# Patient Record
Sex: Female | Born: 1940
Health system: Southern US, Community
[De-identification: ages and names within clinical notes are randomized; demographics above are authoritative.]

## PROBLEM LIST (undated history)

## (undated) ENCOUNTER — Emergency Department (HOSPITAL_COMMUNITY): Admission: EM | Payer: Self-pay | Source: Home / Self Care

## (undated) DIAGNOSIS — E785 Hyperlipidemia, unspecified: Secondary | ICD-10-CM

## (undated) DIAGNOSIS — K219 Gastro-esophageal reflux disease without esophagitis: Secondary | ICD-10-CM

## (undated) DIAGNOSIS — M8580 Other specified disorders of bone density and structure, unspecified site: Secondary | ICD-10-CM

## (undated) DIAGNOSIS — Z951 Presence of aortocoronary bypass graft: Secondary | ICD-10-CM

## (undated) DIAGNOSIS — I10 Essential (primary) hypertension: Secondary | ICD-10-CM

## (undated) DIAGNOSIS — G459 Transient cerebral ischemic attack, unspecified: Secondary | ICD-10-CM

## (undated) DIAGNOSIS — I639 Cerebral infarction, unspecified: Secondary | ICD-10-CM

## (undated) DIAGNOSIS — I214 Non-ST elevation (NSTEMI) myocardial infarction: Secondary | ICD-10-CM

## (undated) DIAGNOSIS — H5442A4 Blindness left eye category 4, normal vision right eye: Secondary | ICD-10-CM

## (undated) DIAGNOSIS — F419 Anxiety disorder, unspecified: Secondary | ICD-10-CM

## (undated) DIAGNOSIS — I69320 Aphasia following cerebral infarction: Secondary | ICD-10-CM

## (undated) DIAGNOSIS — E876 Hypokalemia: Secondary | ICD-10-CM

## (undated) DIAGNOSIS — I480 Paroxysmal atrial fibrillation: Secondary | ICD-10-CM

## (undated) DIAGNOSIS — I2510 Atherosclerotic heart disease of native coronary artery without angina pectoris: Secondary | ICD-10-CM

## (undated) DIAGNOSIS — I509 Heart failure, unspecified: Secondary | ICD-10-CM

## (undated) HISTORY — DX: Blindness left eye category 4, normal vision right eye: H54.42A4

## (undated) HISTORY — DX: Presence of aortocoronary bypass graft: Z95.1

## (undated) HISTORY — DX: Gastro-esophageal reflux disease without esophagitis: K21.9

## (undated) HISTORY — DX: Hypokalemia: E87.6

## (undated) HISTORY — DX: Paroxysmal atrial fibrillation: I48.0

## (undated) HISTORY — DX: Heart failure, unspecified: I50.9

## (undated) HISTORY — DX: Transient cerebral ischemic attack, unspecified: G45.9

## (undated) HISTORY — DX: Other specified disorders of bone density and structure, unspecified site: M85.80

## (undated) HISTORY — DX: Atherosclerotic heart disease of native coronary artery without angina pectoris: I25.10

## (undated) HISTORY — PX: ABDOMINAL HYSTERECTOMY: SHX81

## (undated) HISTORY — DX: Hyperlipidemia, unspecified: E78.5

## (undated) HISTORY — DX: Cerebral infarction, unspecified: I63.9

## (undated) HISTORY — DX: Aphasia following cerebral infarction: I69.320

## (undated) HISTORY — DX: Anxiety disorder, unspecified: F41.9

---

## 1998-03-28 ENCOUNTER — Encounter: Payer: Self-pay | Admitting: Internal Medicine

## 1999-10-05 ENCOUNTER — Other Ambulatory Visit: Admission: RE | Admit: 1999-10-05 | Discharge: 1999-10-05 | Payer: Self-pay | Admitting: Gynecology

## 2002-09-06 ENCOUNTER — Ambulatory Visit (HOSPITAL_COMMUNITY): Admission: RE | Admit: 2002-09-06 | Discharge: 2002-09-06 | Payer: Self-pay | Admitting: Gastroenterology

## 2004-07-22 ENCOUNTER — Ambulatory Visit: Payer: Self-pay | Admitting: Internal Medicine

## 2004-08-05 ENCOUNTER — Ambulatory Visit: Payer: Self-pay | Admitting: Internal Medicine

## 2004-09-10 ENCOUNTER — Ambulatory Visit: Payer: Self-pay | Admitting: Internal Medicine

## 2004-10-29 ENCOUNTER — Ambulatory Visit: Payer: Self-pay | Admitting: Internal Medicine

## 2004-12-13 ENCOUNTER — Other Ambulatory Visit: Admission: RE | Admit: 2004-12-13 | Discharge: 2004-12-13 | Payer: Self-pay | Admitting: Obstetrics and Gynecology

## 2006-12-21 ENCOUNTER — Encounter: Payer: Self-pay | Admitting: Internal Medicine

## 2006-12-21 DIAGNOSIS — I10 Essential (primary) hypertension: Secondary | ICD-10-CM

## 2006-12-21 DIAGNOSIS — M949 Disorder of cartilage, unspecified: Secondary | ICD-10-CM

## 2006-12-21 DIAGNOSIS — E785 Hyperlipidemia, unspecified: Secondary | ICD-10-CM | POA: Insufficient documentation

## 2006-12-21 DIAGNOSIS — M899 Disorder of bone, unspecified: Secondary | ICD-10-CM | POA: Insufficient documentation

## 2006-12-21 DIAGNOSIS — K219 Gastro-esophageal reflux disease without esophagitis: Secondary | ICD-10-CM | POA: Insufficient documentation

## 2006-12-21 DIAGNOSIS — M8580 Other specified disorders of bone density and structure, unspecified site: Secondary | ICD-10-CM

## 2006-12-21 HISTORY — DX: Essential (primary) hypertension: I10

## 2006-12-21 HISTORY — DX: Other specified disorders of bone density and structure, unspecified site: M85.80

## 2006-12-21 HISTORY — DX: Hyperlipidemia, unspecified: E78.5

## 2007-08-24 ENCOUNTER — Ambulatory Visit: Payer: Self-pay | Admitting: Internal Medicine

## 2007-08-24 DIAGNOSIS — I2510 Atherosclerotic heart disease of native coronary artery without angina pectoris: Secondary | ICD-10-CM

## 2007-08-24 DIAGNOSIS — F411 Generalized anxiety disorder: Secondary | ICD-10-CM | POA: Insufficient documentation

## 2007-08-24 HISTORY — DX: Atherosclerotic heart disease of native coronary artery without angina pectoris: I25.10

## 2007-09-03 ENCOUNTER — Telehealth: Payer: Self-pay | Admitting: *Deleted

## 2007-10-16 ENCOUNTER — Ambulatory Visit: Payer: Self-pay | Admitting: Internal Medicine

## 2007-10-16 LAB — CONVERTED CEMR LAB
BUN: 12 mg/dL (ref 6–23)
Basophils Relative: 1 % (ref 0.0–3.0)
CO2: 31 meq/L (ref 19–32)
Chloride: 99 meq/L (ref 96–112)
Creatinine, Ser: 0.8 mg/dL (ref 0.4–1.2)
Eosinophils Relative: 3.1 % (ref 0.0–5.0)
HDL: 38.2 mg/dL — ABNORMAL LOW (ref >39.0)
Lymphocytes Relative: 23.6 % (ref 12.0–46.0)
Monocytes Relative: 6.6 % (ref 3.0–12.0)
Neutrophils Relative %: 65.7 % (ref 43.0–77.0)
RBC: 4.71 M/uL (ref 3.87–5.11)
Total CHOL/HDL Ratio: 8
VLDL: 43 mg/dL — ABNORMAL HIGH (ref 0–40)
WBC: 6.2 10*3/uL (ref 4.5–10.5)

## 2007-10-23 ENCOUNTER — Ambulatory Visit: Payer: Self-pay | Admitting: Internal Medicine

## 2008-01-09 ENCOUNTER — Ambulatory Visit: Payer: Self-pay | Admitting: Internal Medicine

## 2008-01-09 LAB — CONVERTED CEMR LAB
Bilirubin, Direct: 0.2 mg/dL (ref 0.0–0.3)
Direct LDL: 203.5 mg/dL
HDL: 37.2 mg/dL — ABNORMAL LOW (ref >39.0)
Rh Type: POSITIVE
Total Bilirubin: 1.2 mg/dL (ref 0.3–1.2)
Total Protein: 7.2 g/dL (ref 6.0–8.3)
Triglycerides: 182 mg/dL — ABNORMAL HIGH (ref 0–149)
VLDL: 36 mg/dL (ref 0–40)

## 2008-01-16 ENCOUNTER — Ambulatory Visit: Payer: Self-pay | Admitting: Internal Medicine

## 2008-01-25 ENCOUNTER — Telehealth: Payer: Self-pay | Admitting: Internal Medicine

## 2008-09-08 ENCOUNTER — Encounter: Payer: Self-pay | Admitting: Internal Medicine

## 2009-05-06 ENCOUNTER — Ambulatory Visit (HOSPITAL_COMMUNITY): Admission: RE | Admit: 2009-05-06 | Discharge: 2009-05-06 | Payer: Self-pay | Admitting: Gastroenterology

## 2011-03-25 ENCOUNTER — Encounter (HOSPITAL_COMMUNITY)
Admission: EM | Disposition: A | Discharge status: Home / Self Care | Payer: Self-pay | Source: Other Acute Inpatient Hospital | Attending: Cardiothoracic Surgery

## 2011-03-25 ENCOUNTER — Inpatient Hospital Stay (HOSPITAL_COMMUNITY)
Admission: EM | Admit: 2011-03-25 | Discharge: 2011-04-02 | DRG: 234 | Disposition: A | Discharge status: Home / Self Care | Payer: Medicare Other | Source: Other Acute Inpatient Hospital | Attending: Cardiothoracic Surgery | Admitting: Cardiothoracic Surgery

## 2011-03-25 ENCOUNTER — Other Ambulatory Visit: Payer: Self-pay

## 2011-03-25 ENCOUNTER — Ambulatory Visit (INDEPENDENT_AMBULATORY_CARE_PROVIDER_SITE_OTHER): Payer: Medicare Other

## 2011-03-25 ENCOUNTER — Encounter (HOSPITAL_COMMUNITY): Payer: Self-pay | Admitting: Anesthesiology

## 2011-03-25 ENCOUNTER — Encounter (HOSPITAL_COMMUNITY): Payer: Self-pay | Admitting: Cardiology

## 2011-03-25 ENCOUNTER — Inpatient Hospital Stay (HOSPITAL_COMMUNITY): Payer: Medicare Other

## 2011-03-25 DIAGNOSIS — Z951 Presence of aortocoronary bypass graft: Secondary | ICD-10-CM

## 2011-03-25 DIAGNOSIS — I219 Acute myocardial infarction, unspecified: Secondary | ICD-10-CM

## 2011-03-25 DIAGNOSIS — I214 Non-ST elevation (NSTEMI) myocardial infarction: Principal | ICD-10-CM | POA: Diagnosis present

## 2011-03-25 DIAGNOSIS — I4729 Other ventricular tachycardia: Secondary | ICD-10-CM

## 2011-03-25 DIAGNOSIS — K219 Gastro-esophageal reflux disease without esophagitis: Secondary | ICD-10-CM | POA: Diagnosis present

## 2011-03-25 DIAGNOSIS — I472 Ventricular tachycardia, unspecified: Secondary | ICD-10-CM | POA: Diagnosis not present

## 2011-03-25 DIAGNOSIS — I10 Essential (primary) hypertension: Secondary | ICD-10-CM | POA: Diagnosis present

## 2011-03-25 DIAGNOSIS — I480 Paroxysmal atrial fibrillation: Secondary | ICD-10-CM | POA: Diagnosis not present

## 2011-03-25 DIAGNOSIS — E785 Hyperlipidemia, unspecified: Secondary | ICD-10-CM | POA: Diagnosis present

## 2011-03-25 DIAGNOSIS — E8779 Other fluid overload: Secondary | ICD-10-CM | POA: Diagnosis not present

## 2011-03-25 DIAGNOSIS — I4891 Unspecified atrial fibrillation: Secondary | ICD-10-CM | POA: Diagnosis not present

## 2011-03-25 DIAGNOSIS — J9819 Other pulmonary collapse: Secondary | ICD-10-CM | POA: Diagnosis not present

## 2011-03-25 DIAGNOSIS — F411 Generalized anxiety disorder: Secondary | ICD-10-CM | POA: Diagnosis present

## 2011-03-25 DIAGNOSIS — D62 Acute posthemorrhagic anemia: Secondary | ICD-10-CM | POA: Diagnosis not present

## 2011-03-25 DIAGNOSIS — Z882 Allergy status to sulfonamides status: Secondary | ICD-10-CM

## 2011-03-25 DIAGNOSIS — I2510 Atherosclerotic heart disease of native coronary artery without angina pectoris: Secondary | ICD-10-CM

## 2011-03-25 DIAGNOSIS — M949 Disorder of cartilage, unspecified: Secondary | ICD-10-CM | POA: Diagnosis present

## 2011-03-25 DIAGNOSIS — Z7982 Long term (current) use of aspirin: Secondary | ICD-10-CM

## 2011-03-25 DIAGNOSIS — Z01810 Encounter for preprocedural cardiovascular examination: Secondary | ICD-10-CM

## 2011-03-25 DIAGNOSIS — Z79899 Other long term (current) drug therapy: Secondary | ICD-10-CM

## 2011-03-25 DIAGNOSIS — M899 Disorder of bone, unspecified: Secondary | ICD-10-CM | POA: Diagnosis present

## 2011-03-25 DIAGNOSIS — J90 Pleural effusion, not elsewhere classified: Secondary | ICD-10-CM | POA: Diagnosis not present

## 2011-03-25 HISTORY — PX: CARDIAC CATHETERIZATION: SHX172

## 2011-03-25 HISTORY — DX: Essential (primary) hypertension: I10

## 2011-03-25 HISTORY — PX: LEFT HEART CATHETERIZATION WITH CORONARY ANGIOGRAM: SHX5451

## 2011-03-25 HISTORY — DX: Non-ST elevation (NSTEMI) myocardial infarction: I21.4

## 2011-03-25 HISTORY — DX: Atherosclerotic heart disease of native coronary artery without angina pectoris: I25.10

## 2011-03-25 LAB — COMPREHENSIVE METABOLIC PANEL
ALT: 28 U/L (ref 0–35)
AST: 32 U/L (ref 0–37)
Albumin: 3.6 g/dL (ref 3.5–5.2)
CO2: 26 mEq/L (ref 19–32)
Calcium: 8.7 mg/dL (ref 8.4–10.5)
GFR calc non Af Amer: 85 mL/min — ABNORMAL LOW (ref >90)
Sodium: 140 mEq/L (ref 135–145)
Total Protein: 6.3 g/dL (ref 6.0–8.3)

## 2011-03-25 LAB — LIPID PANEL
Cholesterol: 272 mg/dL — ABNORMAL HIGH (ref 0–200)
HDL: 37 mg/dL — ABNORMAL LOW (ref >39)
Total CHOL/HDL Ratio: 7.4 RATIO
Triglycerides: 197 mg/dL — ABNORMAL HIGH (ref <150)
VLDL: 39 mg/dL (ref 0–40)

## 2011-03-25 LAB — CARDIAC PANEL(CRET KIN+CKTOT+MB+TROPI)
CK, MB: 8.1 ng/mL (ref 0.3–4.0)
Relative Index: 8.1 — ABNORMAL HIGH (ref 0.0–2.5)
Relative Index: 8.1 — ABNORMAL HIGH (ref 0.0–2.5)
Total CK: 108 U/L (ref 7–177)
Troponin I: 2.04 ng/mL (ref <0.30)
Troponin I: 3.59 ng/mL (ref <0.30)

## 2011-03-25 LAB — CBC
MCH: 28.6 pg (ref 26.0–34.0)
Platelets: 216 10*3/uL (ref 150–400)
RBC: 4.37 MIL/uL (ref 3.87–5.11)
RDW: 13.3 % (ref 11.5–15.5)

## 2011-03-25 LAB — URINALYSIS, ROUTINE W REFLEX MICROSCOPIC
Bilirubin Urine: NEGATIVE
Nitrite: NEGATIVE
Protein, ur: NEGATIVE mg/dL
Specific Gravity, Urine: >1.046 — ABNORMAL HIGH (ref 1.005–1.030)
Urobilinogen, UA: 0.2 mg/dL (ref 0.0–1.0)

## 2011-03-25 LAB — URINE MICROSCOPIC-ADD ON

## 2011-03-25 LAB — MAGNESIUM: Magnesium: 2.1 mg/dL (ref 1.5–2.5)

## 2011-03-25 LAB — POCT I-STAT, CHEM 8
Chloride: 104 mEq/L (ref 96–112)
HCT: 39 % (ref 36.0–46.0)
Hemoglobin: 13.3 g/dL (ref 12.0–15.0)
Potassium: 3.1 mEq/L — ABNORMAL LOW (ref 3.5–5.1)
Sodium: 143 mEq/L (ref 135–145)

## 2011-03-25 LAB — HEMOGLOBIN A1C: Mean Plasma Glucose: 123 mg/dL — ABNORMAL HIGH (ref <117)

## 2011-03-25 LAB — PREPARE RBC (CROSSMATCH)

## 2011-03-25 LAB — MRSA PCR SCREENING: MRSA by PCR: NEGATIVE

## 2011-03-25 SURGERY — LEFT HEART CATHETERIZATION WITH CORONARY ANGIOGRAM
Anesthesia: LOCAL

## 2011-03-25 MED ORDER — CARVEDILOL 6.25 MG PO TABS
6.2500 mg | ORAL_TABLET | Freq: Two times a day (BID) | ORAL | Status: DC
  Filled 2011-03-25: qty 1

## 2011-03-25 MED ORDER — LIDOCAINE HCL (PF) 1 % IJ SOLN
INTRAMUSCULAR | Status: AC
  Filled 2011-03-25: qty 30

## 2011-03-25 MED ORDER — CHLORHEXIDINE GLUCONATE 4 % EX LIQD
60.0000 mL | Freq: Once | CUTANEOUS | Status: AC
  Administered 2011-03-26: 4 via TOPICAL
  Filled 2011-03-25: qty 90

## 2011-03-25 MED ORDER — ALPRAZOLAM 0.25 MG PO TABS: 0.2500 mg | ORAL_TABLET | ORAL | Status: DC | PRN

## 2011-03-25 MED ORDER — HEPARIN (PORCINE) IN NACL 2-0.9 UNIT/ML-% IJ SOLN
INTRAMUSCULAR | Status: AC
  Filled 2011-03-25: qty 2000

## 2011-03-25 MED ORDER — CEFUROXIME SODIUM 1.5 G IJ SOLR
1.5000 g | INTRAMUSCULAR | Status: AC
  Administered 2011-03-26: 1.5 g via INTRAVENOUS
  Filled 2011-03-25: qty 1.5

## 2011-03-25 MED ORDER — SODIUM CHLORIDE 0.9 % IV SOLN
250.0000 mL | INTRAVENOUS | Status: DC | PRN
  Administered 2011-03-26: 14:00:00 via INTRAVENOUS

## 2011-03-25 MED ORDER — EPINEPHRINE HCL 1 MG/ML IJ SOLN
0.5000 ug/min | INTRAVENOUS | Status: DC
  Filled 2011-03-25: qty 4

## 2011-03-25 MED ORDER — MIDAZOLAM HCL 2 MG/2ML IJ SOLN
INTRAMUSCULAR | Status: AC
  Filled 2011-03-25: qty 2

## 2011-03-25 MED ORDER — HEPARIN SOD (PORCINE) IN D5W 100 UNIT/ML IV SOLN
750.0000 [IU]/h | INTRAVENOUS | Status: DC
  Administered 2011-03-25: 750 [IU]/h via INTRAVENOUS
  Filled 2011-03-25 (×2): qty 250

## 2011-03-25 MED ORDER — MAGNESIUM SULFATE 50 % IJ SOLN
40.0000 meq | INTRAMUSCULAR | Status: DC
  Filled 2011-03-25: qty 10

## 2011-03-25 MED ORDER — ZOLPIDEM TARTRATE 5 MG PO TABS: 5.0000 mg | ORAL_TABLET | Freq: Every evening | ORAL | Status: DC | PRN

## 2011-03-25 MED ORDER — POTASSIUM CHLORIDE CRYS ER 20 MEQ PO TBCR
40.0000 meq | EXTENDED_RELEASE_TABLET | Freq: Once | ORAL | Status: AC
  Administered 2011-03-25: 40 meq via ORAL

## 2011-03-25 MED ORDER — NITROGLYCERIN IN D5W 200-5 MCG/ML-% IV SOLN
2.0000 ug/min | INTRAVENOUS | Status: DC
  Administered 2011-03-25: 60 ug/min via INTRAVENOUS
  Administered 2011-03-26: 30 ug/min

## 2011-03-25 MED ORDER — DEXTROSE 5 % IV SOLN
750.0000 mg | INTRAVENOUS | Status: DC
  Filled 2011-03-25 (×2): qty 750

## 2011-03-25 MED ORDER — CHLORHEXIDINE GLUCONATE 4 % EX LIQD
60.0000 mL | Freq: Once | CUTANEOUS | Status: AC
  Administered 2011-03-25: 4 via TOPICAL

## 2011-03-25 MED ORDER — NITROGLYCERIN IN D5W 200-5 MCG/ML-% IV SOLN: 5.0000 ug/min | INTRAVENOUS | Status: DC

## 2011-03-25 MED ORDER — SODIUM CHLORIDE 0.9 % IV SOLN
0.1000 ug/kg/h | INTRAVENOUS | Status: DC
  Filled 2011-03-25: qty 4

## 2011-03-25 MED ORDER — DIAZEPAM 2 MG PO TABS: 2.0000 mg | ORAL_TABLET | ORAL | Status: DC | PRN

## 2011-03-25 MED ORDER — SODIUM CHLORIDE 0.9 % IV SOLN
INTRAVENOUS | Status: DC
  Administered 2011-03-25 – 2011-03-26 (×2): via INTRAVENOUS

## 2011-03-25 MED ORDER — EPTIFIBATIDE 75 MG/100ML IV SOLN
INTRAVENOUS | Status: AC
  Administered 2011-03-25: 1.968 ug/kg/min via INTRAVENOUS
  Filled 2011-03-25: qty 100

## 2011-03-25 MED ORDER — MORPHINE SULFATE 2 MG/ML IJ SOLN: 2.0000 mg | INTRAMUSCULAR | Status: DC | PRN

## 2011-03-25 MED ORDER — DOPAMINE-DEXTROSE 3.2-5 MG/ML-% IV SOLN
2.0000 ug/kg/min | INTRAVENOUS | Status: DC
  Filled 2011-03-25: qty 250

## 2011-03-25 MED ORDER — PANTOPRAZOLE SODIUM 40 MG PO TBEC: 40.0000 mg | DELAYED_RELEASE_TABLET | Freq: Two times a day (BID) | ORAL | Status: DC

## 2011-03-25 MED ORDER — NITROGLYCERIN IN D5W 200-5 MCG/ML-% IV SOLN
INTRAVENOUS | Status: AC
  Administered 2011-03-25: 60 ug/min via INTRAVENOUS
  Filled 2011-03-25: qty 250

## 2011-03-25 MED ORDER — PLASMA-LYTE 148 IV SOLN
INTRAVENOUS | Status: AC
  Administered 2011-03-26: 10:00:00
  Filled 2011-03-25: qty 0.5

## 2011-03-25 MED ORDER — ROSUVASTATIN CALCIUM 40 MG PO TABS
40.0000 mg | ORAL_TABLET | Freq: Every day | ORAL | Status: DC
  Administered 2011-03-25: 40 mg via ORAL
  Filled 2011-03-25 (×3): qty 1

## 2011-03-25 MED ORDER — CARVEDILOL 6.25 MG PO TABS
6.2500 mg | ORAL_TABLET | Freq: Two times a day (BID) | ORAL | Status: AC
  Administered 2011-03-25: 6.25 mg via ORAL
  Filled 2011-03-25: qty 1

## 2011-03-25 MED ORDER — EPTIFIBATIDE 75 MG/100ML IV SOLN
2.0000 ug/kg/min | INTRAVENOUS | Status: DC
  Administered 2011-03-25 – 2011-03-26 (×2): 1.968 ug/kg/min via INTRAVENOUS
  Filled 2011-03-25: qty 100

## 2011-03-25 MED ORDER — SODIUM CHLORIDE 0.9 % IJ SOLN
3.0000 mL | Freq: Two times a day (BID) | INTRAMUSCULAR | Status: DC
  Administered 2011-03-25: 3 mL via INTRAVENOUS

## 2011-03-25 MED ORDER — ASPIRIN EC 325 MG PO TBEC
325.0000 mg | DELAYED_RELEASE_TABLET | Freq: Every day | ORAL | Status: DC
  Administered 2011-03-25: 325 mg via ORAL
  Filled 2011-03-25: qty 1

## 2011-03-25 MED ORDER — PHENYLEPHRINE HCL 10 MG/ML IJ SOLN
30.0000 ug/min | INTRAVENOUS | Status: DC
  Filled 2011-03-25: qty 2

## 2011-03-25 MED ORDER — ONDANSETRON HCL 4 MG/2ML IJ SOLN: 4.0000 mg | Freq: Four times a day (QID) | INTRAMUSCULAR | Status: DC | PRN

## 2011-03-25 MED ORDER — NITROGLYCERIN 0.2 MG/ML ON CALL CATH LAB
INTRAVENOUS | Status: AC
  Filled 2011-03-25: qty 1

## 2011-03-25 MED ORDER — SODIUM CHLORIDE 0.9 % IJ SOLN: 3.0000 mL | INTRAMUSCULAR | Status: DC | PRN

## 2011-03-25 MED ORDER — PANTOPRAZOLE SODIUM 40 MG PO TBEC
40.0000 mg | DELAYED_RELEASE_TABLET | Freq: Every day | ORAL | Status: DC
  Administered 2011-03-25: 40 mg via ORAL
  Filled 2011-03-25: qty 1

## 2011-03-25 MED ORDER — POTASSIUM CHLORIDE 2 MEQ/ML IV SOLN
80.0000 meq | INTRAVENOUS | Status: DC
  Filled 2011-03-25: qty 40

## 2011-03-25 MED ORDER — NITROGLYCERIN IN D5W 200-5 MCG/ML-% IV SOLN
2.0000 ug/min | INTRAVENOUS | Status: DC
  Filled 2011-03-25: qty 250

## 2011-03-25 MED ORDER — CHLORHEXIDINE GLUCONATE 4 % EX LIQD
60.0000 mL | Freq: Once | CUTANEOUS | Status: AC
  Administered 2011-03-25: 4 via TOPICAL
  Filled 2011-03-25: qty 30

## 2011-03-25 MED ORDER — SODIUM CHLORIDE 0.9 % IV SOLN
INTRAVENOUS | Status: DC
  Filled 2011-03-25: qty 1

## 2011-03-25 MED ORDER — POTASSIUM CHLORIDE CRYS ER 20 MEQ PO TBCR
40.0000 meq | EXTENDED_RELEASE_TABLET | Freq: Once | ORAL | Status: AC
  Administered 2011-03-25: 40 meq via ORAL
  Filled 2011-03-25: qty 2

## 2011-03-25 MED ORDER — SODIUM CHLORIDE 0.9 % IV SOLN
INTRAVENOUS | Status: DC
  Filled 2011-03-25: qty 40

## 2011-03-25 MED ORDER — SODIUM CHLORIDE 0.9 % IV SOLN
1250.0000 mg | INTRAVENOUS | Status: AC
  Administered 2011-03-26: 1250 mg via INTRAVENOUS
  Filled 2011-03-25: qty 1250

## 2011-03-25 MED ORDER — ACETAMINOPHEN 325 MG PO TABS
650.0000 mg | ORAL_TABLET | ORAL | Status: DC | PRN
  Administered 2011-03-25: 650 mg via ORAL
  Filled 2011-03-25: qty 2

## 2011-03-25 MED ORDER — METOPROLOL TARTRATE 12.5 MG HALF TABLET
12.5000 mg | ORAL_TABLET | Freq: Once | ORAL | Status: AC
  Administered 2011-03-26: 12.5 mg via ORAL
  Filled 2011-03-25: qty 1

## 2011-03-25 MED ORDER — FENTANYL CITRATE 0.05 MG/ML IJ SOLN
INTRAMUSCULAR | Status: AC
  Filled 2011-03-25: qty 2

## 2011-03-25 MED ORDER — POTASSIUM CHLORIDE CRYS ER 20 MEQ PO TBCR
EXTENDED_RELEASE_TABLET | ORAL | Status: AC
  Administered 2011-03-25: 20:00:00
  Filled 2011-03-25: qty 2

## 2011-03-25 NOTE — Progress Notes (Signed)
Pharmacy Addendum: CABG planned for tomorrow AM.  Sheath to remain in place and heparin ordered to start now and d/c on call to OR in am.  Start heparin at 750 units/hr.  Check 6h level and CBC.  Cailie Bosshart L. Illene Bolus, PharmD, BCPS Clinical Pharmacist Pager: [phone] 03/25/2011 9:12 PM

## 2011-03-25 NOTE — ED Notes (Signed)
Pt is a transfer from [facility], STEMI activated. Pt arrived EMS and stopped at charge RN desk. EMSs EKG shown to edp and cardiology on call. Pt received 324asa, 50mg  metoprolol, and 2 nitros pta. Pt with 18g(L)AC 20g(R)AC. Pt transferred to cath lab on cardiac monitor. Husband shown to waiting area.

## 2011-03-25 NOTE — Brief Op Note (Signed)
03/25/2011  6:11 PM  PATIENT:  Stacy Adams  70 y.o. female with a PMH of obesity & HTN, likely HLD transferred from Urgent Care after presenting with SSx c/w Unstable Angina - diffuse ST segment depressions without clear ST Elevaations.  Troponin on initial check Positive for NSTEMI.  PRE-OPERATIVE DIAGNOSIS:  NSTEMI  POST-OPERATIVE DIAGNOSIS:  Severe MV CAD:  Prox LAD - focal 85-90% at takeoff of SP1 & small D1, followed by diffuse ~60-70% lesions with the final ~70-80% lesion just distal to major D2  Mid LCx prior to main OM1-OM2 bifurcation - 95%, tubular  Mid RCA sequential 95+% then 80% between 2 RVM branches; ostial RPDA 70%  EF well preserved - ~60% w/o significant WMA.  PROCEDURE:  Procedure(s): LEFT HEART CATHETERIZATION WITH CORONARY ANGIOGRAM LEFT VENTRICULOGRAPHY  Surgeon(s): Marykay Lex  ASSISTANTS: Alinda Dooms, RN  ANESTHESIA:   local and IV sedation, 2ng Versed, 50 mcg Fentanyl MEDICATIONS:  Integrilin bolus x 1 with drip started; IV NTG gtt @ 40mcg/min   EBL:    < 10 ml  LOCAL MEDICATIONS USED:  LIDOCAINE 18 ml   TOURNIQUET:  Manual pressure for sheath removal 2hr post Integrilin bolus  DICTATION: .Note written in paper chart and Note written in EPIC  PLAN OF CARE: Admit to inpatient ; CT Sgx (Dr.Gerhardt) consulted for CABG eval.  IV Heparin & Integrilin until CABG (at least 18hr of Integrilin, but with extent of disease - would prefer until CABG)  2D Echo  PATIENT DISPOSITION:  PACU - hemodynamically stable.  To CCU - 2905   Delay start of Pharmacological VTE agent (>24hrs) due to surgical blood loss or risk of bleeding:  N/A - will be on IV Heparin & Integrilin

## 2011-03-25 NOTE — Op Note (Signed)
[facility]     CARDIAC CATHETERIZATION REPORT  Stacy Adams   MRN: [id number] [birth year]    ADMIT DATE:  03/25/2011  Performing Cardiologist: Marykay Lex, MD,MS Primary Physician: Carrie Mew, MD Primary Cardiologist:  Marykay Lex, MD,MS  Procedures Performed:  Left Heart Catheterization via 6 Fr Right Common Femoral Artery access  Left Ventriculography, (RAO) 11 ml/sec for 33 ml total contrast  Native Coronary Angiography  Indication(s): Acute Cornary Syndrome - NSTEMI  Accelerated Hypertension  Abnormal ECG - diffuse Anterolateral ST segment depression  Postitive Troponin  History: 70 y.o. female with a PMH of obesity & HTN, likely HLD transferred from Urgent Care after presenting with SSx c/w Unstable Angina - diffuse ST segment depressions without clear ST Elevaations. Troponin on initial check Positive for NSTEMI. After further discussion, she has been having exertional dyspnea and chest pain for over a month.  Consent: The procedure with Risks/Benefits/Alternatives and Indications was reviewed with the patient.   The patient voice understanding and agree to proceed.  Verbal consent for Emergent procedure was obtained with multiple witnesses from Cath Lab staff.  Procedure: The patient was brought to the 2nd Floor [facility] in the fasting state and prepped and draped in the usual sterile fashion for Right groin access. Sterile technique was used including antiseptics, cap, gloves, gown, hand hygiene, mask and sheet. Skin prep: Chlorhexidine;  Time Out: Verified patient identification, verified procedure, site/side was marked, verified correct patient position, special equipment/implants available, medications/allergies/relevent history reviewed, required imaging and test results available.  Performed  The right femoral head was identified using tactile and fluoroscopic technique.  The right groin  was anesthetized with 1% subcutaneous Lidocaine.  The right Common Femoral Artery was accessed using the Modified Seldinger Technique with placement of a antimicrobial bonded/coated single lumen (6 Fr) sheath.  The sheath was aspirated and flushed.    A 6 Fr JL4.0 Catheter was advanced of over a Standard J wire into the ascending Aorta.  The catheter was used unsuccessful in engaging the Left Coronary Artery, so it was exchanged for a 6Fr JL 3.5 catheter which was used to engage the Left Coronary Artery.  Multiple cineangiographic views of the Lef Coronary Artery system were performed.  The JL 3.5 catheter was exchanged, over the Standard J wire, for a 6 Fr JR4 Catheter that was used to engage the Right Coronary Artery.  Multiple cineangiographic views of the Right Coronary Artery system were performed. This catheter was then exchanged, over the Standard J wire, for an angled Pigtail catheter that was advanced across the Aortic Valve.  LV hemodynamics were measured and Left Ventriculography was performed.  LV hemodynamics were then re-sampled, and the catheter was pulled back across the Aortic Valve for measurement of "pull-back" gradient.  The catheter and the wire was removed completely out of the body. With the wire still in place the sheath, a preselected right common femoral angiogram was performed to verify sheath position. The sheath was sutured in place as the patient was administered IV Integrilin bolus with drip upon completion of angiography.  The patient was transported to the PACU in CCU in hemodynamically stable, pain-free condition.   The patient  was stable before, during and following the procedure.   Patient did tolerate procedure well. There were not complications.  EBL: Less than 10 mL for procedure; 20 mL for labs  Medications:  Sedation:  2 mg IV Versed, 50 IV mcg Fentanyl  Contrast:  100 mL Omnipaque  Integrilin bolus and drip (single bolus)-weight-based to be continued until  bypass surgery  Hemodynamics:  Central Aortic Pressure / Mean Aortic Pressure: 167/90 mmHg ( )  LV Pressure / LV End diastolic Pressure:  166/13 mmHg; 19 mmHg  Left Ventriculography:  EF:  60-65%  Wall Motion: No significant abnormality - apical "nipple" noted.  Coronary Angiographic Data: Right Dominant  Left Main:  Large caliber, trifurcates into LAD, Ramus Intermedius, Left Circumflex; angiographically normal  Left Anterior Descending (LAD):  Moderate caliber (2.0-2.5 mm) vessel; At the take off of a very proximal D1 and SP1 there is a focal 85-90% lesion followed by 3 executive 60-70% lesions with a final lesion being just after the major diagonal (D2) branch in the mid vessel with 80% lesion. In this intervening space chart to septal perforators the second one being a large major perforating branch.  1st diagonal (D1):  Small-caliber minimal luminal irregularities  2nd diagonal (D2):  Small to moderate caliber, bifurcating, minimal luminal irregularities  Circumflex (LCx):  Large caliber vessel with a 95-99 % tubular mid lesion just prior to bifurcation into OM 1 OM 2, small AV groove branch  First and Second Obtuse Marginal:  Moderate caliber vessels with large distribution covered minimal luminal irregularities; both good distal targets  Ramus Intermedius:  Small-caliber vessel with a focal hazy lesion in the proximal/ostial position roughly 60%. After the stenosis, the vessel bifurcates and becomes even smaller in caliber with each branch.  Right Coronary Artery: Large caliber dominant vessel with a mid vessel 95-90% tubular lesion between 2 RV marginal branches followed by a focal 80% lesion  The distal vessel is diffuse luminal irregularities a tubular 50% lesion just prior to was essentially a trifurcation into a posterior lateral system and a bifurcating PDA.  Posterior Descending Artery: Bifurcating vessel with a ostial 70% lesion in the inferior branch; small caliber  but good target vessel  Posterior Lateral Branch:  Right posterior atrioventricular groove vessel that branches into 2 posterior lateral branches; minimal luminal irregularities; small-caliber good target vessel  Impression: 1.  Severe Multivessel Disease involving the proximal to mid LAD with extensive disease ranging from 60-90% as well as mid RCA and circumflex and 95-99% lesions. 2.  Accelerated Hypertension with systolic pressures in the 180s to 190s. 3.  Well-Preserved LV Ejection Fraction with no major wall motion abnormalities and only mildly elevated LVEDP. 4.  Biomarker positivity (Troponin greater than 2) indicating NonSTEMI along with diffuse ST segment depressions --indicative of high-risk patient.  Plan: 1.  CT surgery consultation has been called. I discussed the case with Dr. Tyrone Sage who will see the patient tonight, based upon presentation and anatomy would consider earlier CABG than Monday.  2.  Will continue Integrilin pending decision of when to go CABG. This will be to discontinue by Dr. Tyrone Sage pre-CABG. 3.  Initiate IV heparin without bolus as sheath be kept in place for planned CABG. 4.  IV heparin for blood pressure control and to prevent  further angina. 5.  Initiate statin and carvedilol. Aspirin and no thienopyridine for planned CABG.   The case and results was discussed with the patient (and family). The case and results was not discussed with the patient's PCP.  The case was discussed with Dr. Hal Hope from Urgent Care.    Time Spend Directly with Patient:   45  minutes  Allaya Abbasi W, M.D., M.S. [facility] [address] [place], [place]  [address]  [phone]  03/25/2011 10:14 PM

## 2011-03-25 NOTE — Progress Notes (Signed)
ANTICOAGULATION CONSULT NOTE - Initial Consult  Pharmacy Consult for heparin and integrilin Indication: NSTEMI  No Known Allergies  Patient Measurements:  unknown  Vital Signs: Pulse Rate: 73  (12/28 1702)  Labs:  [facility] 03/25/11 1722  HGB 12.513.3  HCT 37.139.0  PLT 216  APTT 34  LABPROT 13.8  INR 1.04  HEPARINUNFRC --  CREATININE 0.680.70  CKTOTAL 100  CKMB 8.1*  TROPONINI 2.04*   The CrCl is unknown because both a height and weight (above a minimum accepted value) are required for this calculation.  Medical History: Past Medical History  Diagnosis Date  . NSTEMI (non-ST elevated myocardial infarction) Urgent cath 03/25/2011  . ANXIETY 08/24/2007  . GERD 12/21/2006  . HYPERLIPIDEMIA 12/21/2006  . HYPERTENSION 12/21/2006  . OSTEOPENIA 12/21/2006    Medications:  Scheduled:    . eptifibatide      . fentaNYL      . heparin      . heparin      . lidocaine      . lidocaine      . midazolam      . nitroGLYCERIN      . nitroGLYCERIN      . nitroGLYCERIN        Assessment: 70 yo F w/ NSTEMI, in cath tonight found to have severe CAD for CABG consult.  To start heparin 6h after sheath is pulled (currently in place) and continue integrilin gtt.   Goal of Therapy:  Heparin level 0.3 - 0.5 with integrilin   Plan:  1.  Follow-up with RN to determine when sheath is pulled. 2.  6h after sheath pulled, start heparin at  3.  Continue integrilin at 2 mcg/kg/min (renal function WNL) 4.  Check heparin level and cbc 6h after heparin gtt starts. 5.  Check daily heparin level and CBC.   Cyrene Gharibian L. Illene Bolus, PharmD, BCPS Clinical Pharmacist Pager: [phone] 03/25/2011 7:23 PM

## 2011-03-25 NOTE — Progress Notes (Signed)
*  PRELIMINARY RESULTS* Bilateral: No evidence of hemodynamically significant internal carotid artery stenosis.   Right vertebral artery flow is " To - Fro" consistent with an innominate obstruction. Left vertebral flow is antegrade. Bilateral:  Normal Allen's test Bilateral: Pedal pulses are palpable.    Stacy Adams, [place] D 03/25/2011, 7:46 PM

## 2011-03-25 NOTE — H&P (Signed)
Stacy Adams is an 70 y.o. female.   Chief Complaint: chest pain HPI: 70 year old WF presents to cath lab after developing chest pain around 1400.  EMS called and EKG with ST depression V2, V3, V4, V5, V6, I, II.  Brought emergently to cath lab for emergent cardiac cath.  Associated Symptoms were mild SOB, no nausea, but positive mild diaphoresis.    She also stated she has decreased her walking due to chest tightness over the last couple of months. She thought it was related to the cold.   Now post cath no chest pain though her chest pain was resolved on arrival to the cath lab.  Past Medical History  Diagnosis Date  . NSTEMI (non-ST elevated myocardial infarction) Urgent cath 03/25/2011  . ANXIETY 08/24/2007  . GERD 12/21/2006  . HYPERLIPIDEMIA 12/21/2006  . HYPERTENSION 12/21/2006  . OSTEOPENIA 12/21/2006    History reviewed. No pertinent past surgical history.  History reviewed. No pertinent family history. Social History:  does not have a smoking history on file. She does not have any smokeless tobacco history on file. Her alcohol and drug histories not on file.  Married , 2 children, 1 Grandchild that died, which upsets her.  Allergies: No Known Allergies  Medications Prior to Admission  Medication Dose Route Frequency Provider Last Rate Last Dose  . fentaNYL (SUBLIMAZE) 0.05 MG/ML injection           . heparin 2-0.9 UNIT/ML-% infusion           . lidocaine (XYLOCAINE) 1 % injection           . midazolam (VERSED) 2 MG/2ML injection           . nitroGLYCERIN (NTG ON-CALL) 0.2 mg/mL injection            No current outpatient prescriptions on file as of 03/25/2011.  Pt. Takes asa daily and multiple herbal medications.  No results found for this or any previous visit (from the past 48 hour(s)). No results found.  NO PRIMARY MD  ROS: General: No colds, no fevers. Skin: no rashes. HEENT: no blurred vision CV see HPI Pul: no sob GI: Occ. Irritable bowel but no recent  constipation. GU: Freq urination recently and feeling like her bladder is full MS: No complaints of arthritic pain Neuro: no syncope or lightheadness. Endocrine: no awareness of diabetes or thyroid disease.  There were no vitals taken for this visit.   PE: General:Anxious, alert, oriented X 3 pleasant affect Skin:W&D, brisk capillary refill HEENT:normocephalic, sclera clear. Neck:supple, no JVD, no carotid bruits Heart:S1S2 rrr, no obvious murmur, gallup, rub, or click. Lungs:clear ant. No rales, or rhonchi Abd:+ BS, soft, non tender. Ext:no edema, 2+ pedal pulses bil. Neuro:A&O X 3 mae, follows commands.   Assessment/Plan Patient Active Problem List  Diagnoses  . HYPERLIPIDEMIA  . ANXIETY  . HYPERTENSION  . GERD  . OSTEOPENIA  . NSTEMI (non-ST elevated myocardial infarction) Urgent cath   PLAN:  Sudden chest pain today (with >1 month h/o CP/SOP with exertion), with significant EKG changes.  To the cardiac cath lab.  INGOLD,LAURA R 03/25/2011, 5:16 PM  I have seen and examined the patient along with Nada Boozer, NP.  I have reviewed the chart, notes and new data.  I agree with Laura's note.  Key new complaints: Chest pain now @ 0/10.   Key examination changes: Groin site c/d/i  - sheath remains in place (will stay in until CABG) Key new findings /  data: Troponin +, K 3.0; ECG with much improved ST segments.  PLAN: Have consulted CT Sgx for urgent CABG. BP control -- BB & NTG drip for now. Statin.  Marykay Lex, M.D., M.S. [facility] [address] [place], [place]  [address]  [phone]  03/25/2011 8:17 PM

## 2011-03-25 NOTE — Consult Note (Signed)
[address]            [place] [address]          [phone]       YANICE MAQUEDA [facility] Medical Record #[id number] Date of Birth: [birth year]  Referring: Dr Bryan Lemma Primary Care: Carrie Mew, MD  Chief Complaint:   Chest Pain  History of Present Illness:      70 year old WF presents to cath lab after developing chest pain around 1am. Then this mourning went to Ugent care, after waitng several hours started have chest pain again, EMS called and EKG with ST depression V2, V3, V4, V5, V6, I, II. Brought emergently to cath lab for emergent cardiac cath. Associated Symptoms were mild SOB, no nausea, but positive mild diaphoresis.  She also stated she has decreased her walking due to chest tightness over the last couple of months. She thought it was related to the cold.   Now post cath no chest pain though her chest pain was resolved on arrival to the cath .       Past Medical History  Diagnosis Date  . NSTEMI (non-ST elevated myocardial infarction) Urgent cath 03/25/2011  . ANXIETY 08/24/2007  . GERD 12/21/2006  . HYPERLIPIDEMIA 12/21/2006  . HYPERTENSION 12/21/2006  . OSTEOPENIA 12/21/2006  . CAD, multiple vessel 03/25/2011    History reviewed. No pertinent past surgical history.  History  Smoking status  . None smoker  Smokeless tobacco  . Never Used    History  Alcohol Use No    History   Social History  . Marital Status: Married    Social History Main Topics  . Smoking status: None smoker  . Smokeless tobacco: Never Used  . Alcohol Use: No  . Drug Use: No                Allergies  Allergen Reactions  . Sulfa Antibiotics     Current Facility-Administered Medications  Medication Dose Route Frequency Provider Last Rate Last Dose  . 0.9 %  sodium chloride infusion  250 mL Intravenous PRN Marykay Lex      . 0.9 %  sodium chloride infusion   Intravenous Continuous Marykay Lex      .  acetaminophen (TYLENOL) tablet 650 mg  650 mg Oral Q4H PRN Marykay Lex      . aspirin EC tablet 325 mg  325 mg Oral Daily Marykay Lex      . carvedilol (COREG) tablet 6.25 mg  6.25 mg Oral BID WC Marykay Lex      . diazepam (VALIUM) tablet 2 mg  2 mg Oral Q4H PRN Marykay Lex      . eptifibatide (INTEGRILIN) 75 mg / 100 mL infusion           . eptifibatide (INTEGRILIN) 75 mg / 100 mL infusion  2 mcg/kg/min Intravenous Continuous Marykay Lex      . fentaNYL (SUBLIMAZE) 0.05 MG/ML injection           . heparin 2-0.9 UNIT/ML-% infusion           . heparin 2-0.9 UNIT/ML-% infusion           . lidocaine (XYLOCAINE) 1 % injection           . lidocaine (XYLOCAINE) 1 % injection           .  midazolam (VERSED) 2 MG/2ML injection           . morphine 2 MG/ML injection 2 mg  2 mg Intravenous Q1H PRN Marykay Lex      . nitroGLYCERIN (NTG ON-CALL) 0.2 mg/mL injection           . nitroGLYCERIN (NTG ON-CALL) 0.2 mg/mL injection           . nitroGLYCERIN 0.2 mg/mL in dextrose 5 % infusion           . nitroGLYCERIN 0.2 mg/mL in dextrose 5 % infusion  2-200 mcg/min Intravenous Continuous Marykay Lex      . nitroGLYCERIN 0.2 mg/mL in dextrose 5 % infusion  5 mcg/min Intravenous Continuous Leone Brand, NP      . ondansetron [facility]) injection 4 mg  4 mg Intravenous Q6H PRN Marykay Lex      . pantoprazole (PROTONIX) EC tablet 40 mg  40 mg Oral Q1200 Leone Brand, NP      . potassium chloride SA (K-DUR,KLOR-CON) 20 MEQ CR tablet           . potassium chloride SA (K-DUR,KLOR-CON) CR tablet 40 mEq  40 mEq Oral Once Leone Brand, NP      . potassium chloride SA (K-DUR,KLOR-CON) CR tablet 40 mEq  40 mEq Oral Once Leone Brand, NP      . rosuvastatin (CRESTOR) tablet 40 mg  40 mg Oral Daily Leone Brand, NP      . sodium chloride 0.9 % injection 3 mL  3 mL Intravenous Q12H Marykay Lex      . sodium chloride 0.9 % injection 3 mL  3 mL Intravenous PRN Marykay Lex      .  zolpidem (AMBIEN) tablet 5 mg  5 mg Oral QHS PRN Marykay Lex      . DISCONTD: pantoprazole (PROTONIX) EC tablet 40 mg  40 mg Oral BID AC Leone Brand, NP        Prescriptions prior to admission  Medication Sig Dispense Refill  . aspirin EC 81 MG tablet Take 81 mg by mouth daily.        Marland Kitchen OVER THE COUNTER MEDICATION Take 1 tablet by mouth daily. "Source of Life" multivitamin       . vitamin C (ASCORBIC ACID) 500 MG tablet Take 500 mg by mouth daily.        Patient notes she has taken extra ASA over past week secondary to chest pain  Family History  Problem Relation Age of Onset  . Alzheimer's disease Mother   . Alzheimer's disease Father   . Cancer Brother   . Lupus Brother      Review of Systems:     Cardiac Review of Systems: Y or N  Chest Pain [  y ]  Resting SOB [  y ] Exertional SOB  [ y ]  Pollyann Kennedy Milo.Brash  ]   Pedal Edema [ n  ]    Palpitations [ n ] Syncope  [n  ]   Presyncope [n   ]  General Review of Systems: [Y] = yes [  ]=no Constitional: recent weight change [  ]; anorexia [  ]; fatigue [  ]; nausea [  ]; night sweats [  ]; fever [  ]; or chills [  ];  Dental: poor dentition[ n ];  Eye : blurred vision [n ]; diplopia [ n  ]; vision changes [ n];  Amaurosis fugax[n  ]; Resp: cough [  ];  wheezing[  ];  hemoptysis[  ]; shortness of breath[  ]; paroxysmal nocturnal dyspnea[  ]; dyspnea on exertion[  ]; or orthopnea[  ];  GI:  gallstones[  ], vomiting[  ];  dysphagia[  ]; melena[  ];  hematochezia [  ]; heartburn[y  ];   Hx of  Colonoscopy[  ]; GU: kidney stones [  ]; hematuria[  ];   dysuria [ y ];  nocturia[  ];  history of     obstruction [n  ];             Skin: rash, swelling[  ];, hair loss[  ];  peripheral edema[  ];  or itching[  ]; Musculosketetal: myalgias[  ];  joint swelling[  ];  joint erythema[  ];  joint pain[  ];  back  pain[  ];  Heme/Lymph: bruising[  ];  bleeding[  ];  anemia[  ];  Neuro: TIA[ n ];  headaches[ n ];  stroke[n  ];  vertigo[n  ];  seizures[ n ];   paresthesias[n  ];  difficulty walking[ n ];  Psych:depression[  ]; anxiety[ y ];  Endocrine: diabetes[ n ];  thyroid dysfunction[ n ];  Immunizations: Flu [  ]; Pneumococcal[  ];  Other:  Physical Exam: Pulse 73  Wt 142 lb 13.7 oz (64.8 kg)  General appearance: alert, cooperative, appears stated age and no distress Neurologic: intact Heart: regular rate and rhythm, S1, S2 normal, no murmur, click, rub or gallop and normal apical impulse Lungs: clear to auscultation bilaterally and normal percussion bilaterally Abdomen: soft, non-tender; bowel sounds normal; no masses,  no organomegaly Extremities: extremities normal, atraumatic, no cyanosis or edema, Homans sign is negative, no sign of DVT, no edema, redness or tenderness in the calves or thighs and no ulcers, gangrene or trophic changes No carotid bruits noted No innominate bruit noted Sheath in rt groin  Diagnostic Studies & Laboratory data:     Recent Radiology Findings:   No results found.    Recent Lab Findings: Lab Results  Component Value Date   WBC 8.9 03/25/2011   HGB 12.5 03/25/2011   HGB 13.3 03/25/2011   HCT 37.1 03/25/2011   HCT 39.0 03/25/2011   PLT 216 03/25/2011   GLUCOSE 108* 03/25/2011   GLUCOSE 107* 03/25/2011   CHOL 272* 03/25/2011   TRIG 197* 03/25/2011   HDL 37* 03/25/2011   LDLDIRECT 203.5 01/09/2008   LDLCALC 196* 03/25/2011   ALT 28 03/25/2011   AST 32 03/25/2011   NA 140 03/25/2011   NA 143 03/25/2011   K 3.0* 03/25/2011   K 3.1* 03/25/2011   CL 104 03/25/2011   CL 104 03/25/2011   CREATININE 0.68 03/25/2011   CREATININE 0.70 03/25/2011   BUN 12 03/25/2011   BUN 12 03/25/2011   CO2 26 03/25/2011   INR 1.04 03/25/2011   Lab Results  Component Value Date   CKTOTAL 100 03/25/2011   CKMB 8.1* 03/25/2011   TROPONINI 2.04* 03/25/2011    PRELIMINARY RESULTS*  Bilateral: No evidence of hemodynamically significant internal carotid artery stenosis. Right vertebral artery flow is " To - Fro" consistent with an innominate obstruction. Left vertebral flow is antegrade.  Bilateral: Normal Allen's test  Bilateral: Pedal pulses are palpable.  Slaughter, [place] D  03/25/2011, 7:46 PM  6:11 PM  PATIENT: Evelene Croon 70 y.o. female with a PMH of obesity & HTN, likely HLD transferred from Urgent Care after presenting with SSx c/w Unstable Angina - diffuse ST segment depressions without clear ST Elevaations. Troponin on initial check Positive for NSTEMI.  PRE-OPERATIVE DIAGNOSIS: NSTEMI  POST-OPERATIVE DIAGNOSIS: Severe MV CAD:  Prox LAD - focal 85-90% at takeoff of SP1 & small D1, followed by diffuse ~60-70% lesions with the final ~70-80% lesion just distal to major D2  Mid LCx prior to main OM1-OM2 bifurcation - 95%, tubular  Mid RCA sequential 95+% then 80% between 2 RVM branches; ostial RPDA 70%  EF well preserved - ~60% w/o significant WMA. PROCEDURE: Procedure(s):  LEFT HEART CATHETERIZATION WITH CORONARY ANGIOGRAM  LEFT VENTRICULOGRAPHY  Surgeon(s):  Marykay Lex  ASSISTANTS: Alinda Dooms, RN  ANESTHESIA: local and IV sedation, 2ng Versed, 50 mcg Fentanyl  MEDICATIONS: Integrilin bolus x 1 with drip started; IV NTG gtt @ 39mcg/min  EBL: < 10 ml  LOCAL MEDICATIONS USED: LIDOCAINE 18 ml  TOURNIQUET: Manual pressure for sheath removal 2hr post Integrilin bolus  DICTATION: .Note written in paper chart and Note written in EPIC  PLAN OF CARE: Admit to inpatient ; CT Sgx (Dr.Latoria Dry) consulted for CABG eval.  IV Heparin & Integrilin until CABG (at least 18hr of Integrilin, but with extent of disease - would prefer until CABG)  2D Echo  PATIENT DISPOSITION: PACU - hemodynamically stable. To CCU - 2905  Delay start of Pharmacological VTE agent (>24hrs) due to surgical blood loss or risk of bleeding: N/A - will be on IV  Heparin & Integrilin   Assessment / Plan:   Severe 3 vessel cad Now pain free but critical anatomy  I have recommended cabg in am, and dicussed with cardiology. The goals risks and alternatives of the planned surgical procedure Urgent CABG have been discussed with the patient in detail. The risks of the procedure including death, infection, stroke, myocardial infarction, bleeding, blood transfusion have all been discussed specifically.  I have quoted Evelene Croon a 2 % of perioperative mortality and a complication rate as high as 20 %. The patient's questions have been answered.Chau H Capek is willing  to proceed with the planned procedure.        Delight Ovens MD  Beeper [phone] Office [phone] 03/25/2011 8:18 PM

## 2011-03-26 ENCOUNTER — Encounter (HOSPITAL_COMMUNITY): Payer: Self-pay | Admitting: Anesthesiology

## 2011-03-26 ENCOUNTER — Inpatient Hospital Stay (HOSPITAL_COMMUNITY): Payer: Medicare Other | Admitting: Anesthesiology

## 2011-03-26 ENCOUNTER — Other Ambulatory Visit: Payer: Self-pay

## 2011-03-26 ENCOUNTER — Encounter (HOSPITAL_COMMUNITY): Payer: Self-pay | Admitting: *Deleted

## 2011-03-26 ENCOUNTER — Encounter (HOSPITAL_COMMUNITY)
Admission: EM | Disposition: A | Discharge status: Home / Self Care | Payer: Self-pay | Source: Other Acute Inpatient Hospital | Attending: Cardiothoracic Surgery

## 2011-03-26 ENCOUNTER — Inpatient Hospital Stay (HOSPITAL_COMMUNITY): Payer: Medicare Other

## 2011-03-26 DIAGNOSIS — Z951 Presence of aortocoronary bypass graft: Secondary | ICD-10-CM

## 2011-03-26 DIAGNOSIS — I2510 Atherosclerotic heart disease of native coronary artery without angina pectoris: Secondary | ICD-10-CM

## 2011-03-26 HISTORY — DX: Presence of aortocoronary bypass graft: Z95.1

## 2011-03-26 HISTORY — PX: CORONARY ARTERY BYPASS GRAFT: SHX141

## 2011-03-26 LAB — CBC
HCT: 32.3 % — ABNORMAL LOW (ref 36.0–46.0)
HCT: 35.6 % — ABNORMAL LOW (ref 36.0–46.0)
Hemoglobin: 10.6 g/dL — ABNORMAL LOW (ref 12.0–15.0)
Hemoglobin: 13.1 g/dL (ref 12.0–15.0)
Hemoglobin: 12 g/dL (ref 12.0–15.0)
MCH: 28 pg (ref 26.0–34.0)
MCH: 28.7 pg (ref 26.0–34.0)
MCH: 28.4 pg (ref 26.0–34.0)
MCHC: 32.8 g/dL (ref 30.0–36.0)
MCHC: 33.7 g/dL (ref 30.0–36.0)
MCV: 85.4 fL (ref 78.0–100.0)
MCV: 84.2 fL (ref 78.0–100.0)
Platelets: 207 10*3/uL (ref 150–400)
Platelets: 95 10*3/uL — ABNORMAL LOW (ref 150–400)
Platelets: 117 10*3/uL — ABNORMAL LOW (ref 150–400)
RBC: 3.78 MIL/uL — ABNORMAL LOW (ref 3.87–5.11)
RBC: 4.56 MIL/uL (ref 3.87–5.11)
RBC: 4.23 MIL/uL (ref 3.87–5.11)
RDW: 13.5 % (ref 11.5–15.5)
RDW: 15.1 % (ref 11.5–15.5)
WBC: 9.8 10*3/uL (ref 4.0–10.5)
WBC: 17.2 10*3/uL — ABNORMAL HIGH (ref 4.0–10.5)
WBC: 20.2 10*3/uL — ABNORMAL HIGH (ref 4.0–10.5)

## 2011-03-26 LAB — POCT I-STAT 3, ART BLOOD GAS (G3+)
Acid-base deficit: 3 mmol/L — ABNORMAL HIGH (ref 0.0–2.0)
Bicarbonate: 21.7 mEq/L (ref 20.0–24.0)
Bicarbonate: 22.9 mEq/L (ref 20.0–24.0)
O2 Saturation: 97 %
O2 Saturation: 97 %
Patient temperature: 36
Patient temperature: 37.1
TCO2: 22 mmol/L (ref 0–100)
TCO2: 23 mmol/L (ref 0–100)
TCO2: 24 mmol/L (ref 0–100)
pCO2 arterial: 40.6 mmHg (ref 35.0–45.0)
pCO2 arterial: 38.6 mmHg (ref 35.0–45.0)
pCO2 arterial: 48.2 mmHg — ABNORMAL HIGH (ref 35.0–45.0)
pH, Arterial: 7.378 (ref 7.350–7.400)
pH, Arterial: 7.386 (ref 7.350–7.400)
pH, Arterial: 7.412 — ABNORMAL HIGH (ref 7.350–7.400)
pH, Arterial: 7.264 — ABNORMAL LOW (ref 7.350–7.400)
pO2, Arterial: 105 mmHg — ABNORMAL HIGH (ref 80.0–100.0)
pO2, Arterial: 278 mmHg — ABNORMAL HIGH (ref 80.0–100.0)
pO2, Arterial: 96 mmHg (ref 80.0–100.0)

## 2011-03-26 LAB — POCT I-STAT, CHEM 8
HCT: 34 % — ABNORMAL LOW (ref 36.0–46.0)
Hemoglobin: 11.6 g/dL — ABNORMAL LOW (ref 12.0–15.0)
Sodium: 142 mEq/L (ref 135–145)
TCO2: 22 mmol/L (ref 0–100)

## 2011-03-26 LAB — BASIC METABOLIC PANEL
BUN: 11 mg/dL (ref 6–23)
CO2: 24 mEq/L (ref 19–32)
Calcium: 8.1 mg/dL — ABNORMAL LOW (ref 8.4–10.5)
Chloride: 108 mEq/L (ref 96–112)
Creatinine, Ser: 0.65 mg/dL (ref 0.50–1.10)
GFR calc Af Amer: >90 mL/min (ref >90)
GFR calc non Af Amer: 87 mL/min — ABNORMAL LOW (ref >90)
Glucose, Bld: 107 mg/dL — ABNORMAL HIGH (ref 70–99)
Potassium: 3.7 mEq/L (ref 3.5–5.1)
Sodium: 138 mEq/L (ref 135–145)

## 2011-03-26 LAB — POCT I-STAT 4, (NA,K, GLUC, HGB,HCT)
Glucose, Bld: 114 mg/dL — ABNORMAL HIGH (ref 70–99)
Glucose, Bld: 94 mg/dL (ref 70–99)
Glucose, Bld: 121 mg/dL — ABNORMAL HIGH (ref 70–99)
HCT: 29 % — ABNORMAL LOW (ref 36.0–46.0)
HCT: 19 % — ABNORMAL LOW (ref 36.0–46.0)
HCT: 25 % — ABNORMAL LOW (ref 36.0–46.0)
Hemoglobin: 9.9 g/dL — ABNORMAL LOW (ref 12.0–15.0)
Hemoglobin: 9.2 g/dL — ABNORMAL LOW (ref 12.0–15.0)
Hemoglobin: 6.5 g/dL — CL (ref 12.0–15.0)
Hemoglobin: 8.5 g/dL — ABNORMAL LOW (ref 12.0–15.0)
Potassium: 3.7 mEq/L (ref 3.5–5.1)
Potassium: 5.4 mEq/L — ABNORMAL HIGH (ref 3.5–5.1)
Potassium: 3.2 mEq/L — ABNORMAL LOW (ref 3.5–5.1)
Sodium: 139 mEq/L (ref 135–145)
Sodium: 137 mEq/L (ref 135–145)
Sodium: 138 mEq/L (ref 135–145)

## 2011-03-26 LAB — HEMOGLOBIN AND HEMATOCRIT, BLOOD
HCT: 26.1 % — ABNORMAL LOW (ref 36.0–46.0)
Hemoglobin: 8.9 g/dL — ABNORMAL LOW (ref 12.0–15.0)

## 2011-03-26 LAB — PROTIME-INR
INR: 1.36 (ref 0.00–1.49)
Prothrombin Time: 17 seconds — ABNORMAL HIGH (ref 11.6–15.2)

## 2011-03-26 LAB — CARDIAC PANEL(CRET KIN+CKTOT+MB+TROPI)
Relative Index: INVALID (ref 0.0–2.5)
Troponin I: 3.45 ng/mL (ref <0.30)

## 2011-03-26 LAB — CREATININE, SERUM
Creatinine, Ser: 0.71 mg/dL (ref 0.50–1.10)
GFR calc Af Amer: >90 mL/min (ref >90)
GFR calc non Af Amer: 84 mL/min — ABNORMAL LOW (ref >90)

## 2011-03-26 LAB — SURGICAL PCR SCREEN
MRSA, PCR: NEGATIVE
Staphylococcus aureus: NEGATIVE

## 2011-03-26 LAB — HEMOGLOBIN A1C: Mean Plasma Glucose: 123 mg/dL — ABNORMAL HIGH (ref <117)

## 2011-03-26 LAB — PLATELET COUNT: Platelets: 93 10*3/uL — ABNORMAL LOW (ref 150–400)

## 2011-03-26 LAB — MAGNESIUM: Magnesium: 3.3 mg/dL — ABNORMAL HIGH (ref 1.5–2.5)

## 2011-03-26 LAB — GLUCOSE, CAPILLARY
Glucose-Capillary: 95 mg/dL (ref 70–99)
Glucose-Capillary: 106 mg/dL — ABNORMAL HIGH (ref 70–99)
Glucose-Capillary: 112 mg/dL — ABNORMAL HIGH (ref 70–99)

## 2011-03-26 SURGERY — CORONARY ARTERY BYPASS GRAFTING (CABG)
Anesthesia: General | Site: Chest | Wound class: Clean

## 2011-03-26 MED ORDER — PANTOPRAZOLE SODIUM 40 MG PO TBEC: 40.0000 mg | DELAYED_RELEASE_TABLET | Freq: Every day | ORAL | Status: DC

## 2011-03-26 MED ORDER — ALBUMIN HUMAN 5 % IV SOLN
250.0000 mL | INTRAVENOUS | Status: AC | PRN
  Administered 2011-03-26 – 2011-03-27 (×3): 250 mL via INTRAVENOUS
  Filled 2011-03-26: qty 250

## 2011-03-26 MED ORDER — METOPROLOL TARTRATE 1 MG/ML IV SOLN: 2.5000 mg | INTRAVENOUS | Status: DC | PRN

## 2011-03-26 MED ORDER — PAPAVERINE HCL 30 MG/ML IJ SOLN
INTRAMUSCULAR | Status: DC | PRN
  Administered 2011-03-26: 60 mg via INTRAVENOUS

## 2011-03-26 MED ORDER — FENTANYL CITRATE 0.05 MG/ML IJ SOLN
INTRAMUSCULAR | Status: DC | PRN
  Administered 2011-03-26 (×3): 100 ug via INTRAVENOUS
  Administered 2011-03-26: 50 ug via INTRAVENOUS
  Administered 2011-03-26: 100 ug via INTRAVENOUS
  Administered 2011-03-26 (×2): 50 ug via INTRAVENOUS
  Administered 2011-03-26: 150 ug via INTRAVENOUS
  Administered 2011-03-26: 250 ug via INTRAVENOUS
  Administered 2011-03-26 (×2): 50 ug via INTRAVENOUS
  Administered 2011-03-26: 100 ug via INTRAVENOUS
  Administered 2011-03-26: 150 ug via INTRAVENOUS
  Administered 2011-03-26: 100 ug via INTRAVENOUS

## 2011-03-26 MED ORDER — NITROGLYCERIN IN D5W 200-5 MCG/ML-% IV SOLN
0.0000 ug/min | INTRAVENOUS | Status: DC
  Administered 2011-03-26: 10 ug/min via INTRAVENOUS
  Administered 2011-03-26: 5 ug/min via INTRAVENOUS

## 2011-03-26 MED ORDER — LACTATED RINGERS IV SOLN
INTRAVENOUS | Status: DC
  Administered 2011-03-26: 15:00:00 via INTRAVENOUS

## 2011-03-26 MED ORDER — ONDANSETRON HCL 4 MG/2ML IJ SOLN: 4.0000 mg | Freq: Four times a day (QID) | INTRAMUSCULAR | Status: DC | PRN

## 2011-03-26 MED ORDER — MIDAZOLAM HCL 5 MG/5ML IJ SOLN
INTRAMUSCULAR | Status: DC | PRN
  Administered 2011-03-26: 1 mg via INTRAVENOUS
  Administered 2011-03-26 (×3): 2 mg via INTRAVENOUS
  Administered 2011-03-26 (×2): 1 mg via INTRAVENOUS

## 2011-03-26 MED ORDER — BISACODYL 10 MG RE SUPP: 10.0000 mg | Freq: Every day | RECTAL | Status: DC

## 2011-03-26 MED ORDER — VANCOMYCIN HCL 1000 MG IV SOLR
1000.0000 mg | Freq: Once | INTRAVENOUS | Status: AC
  Administered 2011-03-26: 1000 mg via INTRAVENOUS
  Filled 2011-03-26: qty 1000

## 2011-03-26 MED ORDER — DOPAMINE-DEXTROSE 3.2-5 MG/ML-% IV SOLN
INTRAVENOUS | Status: DC | PRN
  Administered 2011-03-26: 3 ug/kg/min via INTRAVENOUS

## 2011-03-26 MED ORDER — SODIUM CHLORIDE 0.9 % IV SOLN
0.1000 ug/kg/h | INTRAVENOUS | Status: DC
  Filled 2011-03-26 (×2): qty 2

## 2011-03-26 MED ORDER — SODIUM CHLORIDE 0.9 % IV SOLN: INTRAVENOUS | Status: DC

## 2011-03-26 MED ORDER — SODIUM CHLORIDE 0.9 % IV SOLN
10.0000 g | INTRAVENOUS | Status: DC | PRN
  Administered 2011-03-26: 5 g/h via INTRAVENOUS

## 2011-03-26 MED ORDER — PROPOFOL 10 MG/ML IV EMUL
INTRAVENOUS | Status: DC | PRN
  Administered 2011-03-26: 50 mg via INTRAVENOUS

## 2011-03-26 MED ORDER — BISACODYL 5 MG PO TBEC
10.0000 mg | DELAYED_RELEASE_TABLET | Freq: Every day | ORAL | Status: DC
  Administered 2011-03-27: 10 mg via ORAL
  Filled 2011-03-26: qty 2

## 2011-03-26 MED ORDER — SODIUM CHLORIDE 0.9 % IJ SOLN: 3.0000 mL | INTRAMUSCULAR | Status: DC | PRN

## 2011-03-26 MED ORDER — METOPROLOL TARTRATE 12.5 MG HALF TABLET
12.5000 mg | ORAL_TABLET | Freq: Two times a day (BID) | ORAL | Status: DC
  Administered 2011-03-27: 12.5 mg via ORAL
  Filled 2011-03-26 (×3): qty 1

## 2011-03-26 MED ORDER — METOPROLOL TARTRATE 25 MG/10 ML ORAL SUSPENSION
12.5000 mg | Freq: Two times a day (BID) | ORAL | Status: DC
  Filled 2011-03-26 (×3): qty 5

## 2011-03-26 MED ORDER — DEXTROSE 5 % IV SOLN
1.5000 g | Freq: Two times a day (BID) | INTRAVENOUS | Status: AC
  Administered 2011-03-26 – 2011-03-28 (×4): 1.5 g via INTRAVENOUS
  Filled 2011-03-26 (×4): qty 1.5

## 2011-03-26 MED ORDER — LACTATED RINGERS IV SOLN
INTRAVENOUS | Status: DC | PRN
  Administered 2011-03-26 (×2): via INTRAVENOUS

## 2011-03-26 MED ORDER — MIDAZOLAM HCL 2 MG/2ML IJ SOLN
2.0000 mg | INTRAMUSCULAR | Status: DC | PRN
  Filled 2011-03-26: qty 2

## 2011-03-26 MED ORDER — PANTOPRAZOLE SODIUM 40 MG PO TBEC
40.0000 mg | DELAYED_RELEASE_TABLET | Freq: Every day | ORAL | Status: DC
  Administered 2011-03-27: 40 mg via ORAL
  Filled 2011-03-26: qty 1

## 2011-03-26 MED ORDER — PHENYLEPHRINE HCL 10 MG/ML IJ SOLN
10.0000 mg | INTRAVENOUS | Status: DC | PRN
  Administered 2011-03-26: 10 ug/min via INTRAVENOUS

## 2011-03-26 MED ORDER — DOCUSATE SODIUM 100 MG PO CAPS
200.0000 mg | ORAL_CAPSULE | Freq: Every day | ORAL | Status: DC
  Administered 2011-03-27: 200 mg via ORAL

## 2011-03-26 MED ORDER — ASPIRIN EC 325 MG PO TBEC
325.0000 mg | DELAYED_RELEASE_TABLET | Freq: Every day | ORAL | Status: DC
  Administered 2011-03-27: 325 mg via ORAL
  Filled 2011-03-26 (×2): qty 1

## 2011-03-26 MED ORDER — SODIUM CHLORIDE 0.45 % IV SOLN
INTRAVENOUS | Status: DC
  Administered 2011-03-26: 20 mL via INTRAVENOUS

## 2011-03-26 MED ORDER — CALCIUM CHLORIDE 10 % IV SOLN
INTRAVENOUS | Status: DC | PRN
  Administered 2011-03-26: 0.5 g via INTRAVENOUS

## 2011-03-26 MED ORDER — ACETAMINOPHEN 500 MG PO TABS
1000.0000 mg | ORAL_TABLET | Freq: Four times a day (QID) | ORAL | Status: DC
  Administered 2011-03-27 – 2011-03-28 (×5): 1000 mg via ORAL
  Filled 2011-03-26 (×9): qty 2

## 2011-03-26 MED ORDER — MORPHINE SULFATE 4 MG/ML IJ SOLN
2.0000 mg | INTRAMUSCULAR | Status: DC | PRN
  Administered 2011-03-27: 4 mg via INTRAVENOUS
  Filled 2011-03-26: qty 1

## 2011-03-26 MED ORDER — FAMOTIDINE IN NACL 20-0.9 MG/50ML-% IV SOLN
20.0000 mg | Freq: Two times a day (BID) | INTRAVENOUS | Status: DC
  Administered 2011-03-26: 20 mg via INTRAVENOUS

## 2011-03-26 MED ORDER — SODIUM CHLORIDE 0.9 % IV SOLN
100.0000 [IU] | INTRAVENOUS | Status: DC | PRN
  Administered 2011-03-26: 1.6 [IU]/h via INTRAVENOUS

## 2011-03-26 MED ORDER — ACETAMINOPHEN 160 MG/5ML PO SOLN: 650.0000 mg | ORAL | Status: AC

## 2011-03-26 MED ORDER — ROCURONIUM BROMIDE 100 MG/10ML IV SOLN
INTRAVENOUS | Status: DC | PRN
  Administered 2011-03-26 (×3): 50 mg via INTRAVENOUS

## 2011-03-26 MED ORDER — SODIUM CHLORIDE 0.9 % IV SOLN
INTRAVENOUS | Status: DC
  Administered 2011-03-26: 0.7 [IU]/h via INTRAVENOUS
  Filled 2011-03-26: qty 1

## 2011-03-26 MED ORDER — INSULIN ASPART 100 UNIT/ML ~~LOC~~ SOLN
0.0000 [IU] | SUBCUTANEOUS | Status: AC
  Administered 2011-03-26 (×2): 2 [IU] via SUBCUTANEOUS
  Filled 2011-03-26: qty 3

## 2011-03-26 MED ORDER — ASPIRIN 81 MG PO CHEW: 324.0000 mg | CHEWABLE_TABLET | Freq: Every day | ORAL | Status: DC

## 2011-03-26 MED ORDER — ALBUMIN HUMAN 5 % IV SOLN
INTRAVENOUS | Status: DC | PRN
  Administered 2011-03-26: 13:00:00 via INTRAVENOUS

## 2011-03-26 MED ORDER — NITROGLYCERIN IN D5W 200-5 MCG/ML-% IV SOLN
INTRAVENOUS | Status: DC | PRN
  Administered 2011-03-26: 5 ug/min via INTRAVENOUS

## 2011-03-26 MED ORDER — POTASSIUM CHLORIDE 10 MEQ/50ML IV SOLN
10.0000 meq | INTRAVENOUS | Status: AC
  Administered 2011-03-26 (×3): 10 meq via INTRAVENOUS

## 2011-03-26 MED ORDER — HEPARIN SODIUM (PORCINE) 1000 UNIT/ML IJ SOLN
INTRAMUSCULAR | Status: DC | PRN
  Administered 2011-03-26: 21000 [IU] via INTRAVENOUS

## 2011-03-26 MED ORDER — ACETAMINOPHEN 650 MG RE SUPP
650.0000 mg | RECTAL | Status: AC
  Administered 2011-03-26: 650 mg via RECTAL

## 2011-03-26 MED ORDER — PROTAMINE SULFATE 10 MG/ML IV SOLN
INTRAVENOUS | Status: DC | PRN
  Administered 2011-03-26: 200 mg via INTRAVENOUS

## 2011-03-26 MED ORDER — INSULIN ASPART 100 UNIT/ML ~~LOC~~ SOLN
0.0000 [IU] | SUBCUTANEOUS | Status: DC
  Administered 2011-03-26 – 2011-03-27 (×3): 2 [IU] via SUBCUTANEOUS
  Filled 2011-03-26: qty 3

## 2011-03-26 MED ORDER — PHENYLEPHRINE HCL 10 MG/ML IJ SOLN
0.0000 ug/min | INTRAVENOUS | Status: DC
  Administered 2011-03-26: 5 ug/min via INTRAVENOUS
  Filled 2011-03-26: qty 2

## 2011-03-26 MED ORDER — LACTATED RINGERS IV SOLN
INTRAVENOUS | Status: DC | PRN
  Administered 2011-03-26: 07:00:00 via INTRAVENOUS

## 2011-03-26 MED ORDER — SODIUM CHLORIDE 0.9 % IV SOLN: 250.0000 mL | INTRAVENOUS | Status: DC

## 2011-03-26 MED ORDER — LACTATED RINGERS IV SOLN: 500.0000 mL | Freq: Once | INTRAVENOUS | Status: AC | PRN

## 2011-03-26 MED ORDER — MORPHINE SULFATE 2 MG/ML IJ SOLN
1.0000 mg | INTRAMUSCULAR | Status: AC | PRN
  Administered 2011-03-27: 2 mg via INTRAVENOUS
  Filled 2011-03-26: qty 1

## 2011-03-26 MED ORDER — SODIUM CHLORIDE 0.9 % IJ SOLN
3.0000 mL | Freq: Two times a day (BID) | INTRAMUSCULAR | Status: DC
  Administered 2011-03-27 (×2): 3 mL via INTRAVENOUS

## 2011-03-26 MED ORDER — OXYCODONE HCL 5 MG PO TABS
5.0000 mg | ORAL_TABLET | ORAL | Status: DC | PRN
  Administered 2011-03-27: 10 mg via ORAL
  Filled 2011-03-26: qty 2

## 2011-03-26 MED ORDER — ACETAMINOPHEN 160 MG/5ML PO SOLN
975.0000 mg | Freq: Four times a day (QID) | ORAL | Status: DC
  Filled 2011-03-26: qty 40.6

## 2011-03-26 MED ORDER — SODIUM CHLORIDE 0.9 % IV SOLN
200.0000 ug | INTRAVENOUS | Status: DC | PRN
  Administered 2011-03-26: 0.2 ug/kg/h via INTRAVENOUS

## 2011-03-26 MED ORDER — HEMOSTATIC AGENTS (NO CHARGE) OPTIME
TOPICAL | Status: DC | PRN
  Administered 2011-03-26: 1 via TOPICAL

## 2011-03-26 MED ORDER — MAGNESIUM SULFATE 40 MG/ML IJ SOLN
4.0000 g | Freq: Once | INTRAMUSCULAR | Status: AC
  Administered 2011-03-26: 4 g via INTRAVENOUS

## 2011-03-26 MED ORDER — LACTATED RINGERS IV SOLN
INTRAVENOUS | Status: DC | PRN
  Administered 2011-03-26: 08:00:00 via INTRAVENOUS

## 2011-03-26 MED ORDER — VITAMIN C 500 MG PO TABS
500.0000 mg | ORAL_TABLET | Freq: Every day | ORAL | Status: DC
  Administered 2011-03-27: 500 mg via ORAL
  Filled 2011-03-26 (×2): qty 1

## 2011-03-26 MED ORDER — DEXTROSE 5 % IV SOLN
0.7500 g | INTRAVENOUS | Status: DC | PRN
  Administered 2011-03-26: .75 g via INTRAVENOUS

## 2011-03-26 MED ORDER — SODIUM CHLORIDE 0.9 % IJ SOLN
OROMUCOSAL | Status: DC | PRN
  Administered 2011-03-26: 10:00:00 via TOPICAL

## 2011-03-26 SURGICAL SUPPLY — 122 items
ADH SKN CLS APL DERMABOND .7 (GAUZE/BANDAGES/DRESSINGS) ×1
ATTRACTOMAT 16X20 MAGNETIC DRP (DRAPES) ×2 IMPLANT
BAG DECANTER FOR FLEXI CONT (MISCELLANEOUS) ×2 IMPLANT
BANDAGE ACE 4 STERILE (GAUZE/BANDAGES/DRESSINGS) ×2 IMPLANT
BANDAGE ELASTIC 4 VELCRO ST LF (GAUZE/BANDAGES/DRESSINGS) ×2 IMPLANT
BANDAGE ELASTIC 6 VELCRO ST LF (GAUZE/BANDAGES/DRESSINGS) ×2 IMPLANT
BANDAGE GAUZE ELAST BULKY 4 IN (GAUZE/BANDAGES/DRESSINGS) ×3 IMPLANT
BLADE SAW STERNAL (BLADE) ×2 IMPLANT
BLADE SURG ROTATE 9660 (MISCELLANEOUS) IMPLANT
CANISTER SUCTION 2500CC (MISCELLANEOUS) ×2 IMPLANT
CANN PRFSN .5XCNCT 15X34-48 (MISCELLANEOUS) ×1
CANNULA PRFSN .5XCNCT 15X34-48 (MISCELLANEOUS) ×1 IMPLANT
CANNULA VEN 2 STAGE (MISCELLANEOUS) ×3 IMPLANT
CANNULA VESSEL W/WING WO/VALVE (CANNULA) IMPLANT
CATH CPB KIT GERHARDT (MISCELLANEOUS) ×2 IMPLANT
CATH ROBINSON RED A/P 18FR (CATHETERS) IMPLANT
CATH THORACIC 28FR (CATHETERS) ×2 IMPLANT
CATH THORACIC 28FR RT ANG (CATHETERS) IMPLANT
CATH THORACIC 36FR (CATHETERS) IMPLANT
CATH THORACIC 36FR RT ANG (CATHETERS) IMPLANT
CLIP FOGARTY SPRING 6M (CLIP) IMPLANT
CLIP TI MEDIUM 24 (CLIP) IMPLANT
CLIP TI WIDE RED SMALL 24 (CLIP) IMPLANT
CLOTH BEACON ORANGE TIMEOUT ST (SAFETY) ×2 IMPLANT
COVER MAYO STAND STRL (DRAPES) ×1 IMPLANT
COVER SURGICAL LIGHT HANDLE (MISCELLANEOUS) ×5 IMPLANT
CRADLE DONUT ADULT HEAD (MISCELLANEOUS) ×2 IMPLANT
DERMABOND ADVANCED (GAUZE/BANDAGES/DRESSINGS) ×1
DERMABOND ADVANCED .7 DNX12 (GAUZE/BANDAGES/DRESSINGS) IMPLANT
DRAIN CHANNEL 28F RND 3/8 FF (WOUND CARE) ×2 IMPLANT
DRAIN CHANNEL 32F RND 10.7 FF (WOUND CARE) IMPLANT
DRAPE CARDIOVASCULAR INCISE (DRAPES) ×2
DRAPE SLUSH MACHINE 52X66 (DRAPES) ×1 IMPLANT
DRAPE SLUSH/WARMER DISC (DRAPES) IMPLANT
DRAPE SRG 135X102X78XABS (DRAPES) ×1 IMPLANT
DRSG COVADERM 4X14 (GAUZE/BANDAGES/DRESSINGS) ×2 IMPLANT
DRSG TELFA 4X14 ISLAND ADH (GAUZE/BANDAGES/DRESSINGS) ×1 IMPLANT
ELECT BLADE 4.0 EZ CLEAN MEGAD (MISCELLANEOUS) ×2
ELECT CAUTERY BLADE 6.4 (BLADE) ×3 IMPLANT
ELECT REM PT RETURN 9FT ADLT (ELECTROSURGICAL) ×4
ELECTRODE BLDE 4.0 EZ CLN MEGD (MISCELLANEOUS) ×1 IMPLANT
ELECTRODE REM PT RTRN 9FT ADLT (ELECTROSURGICAL) ×2 IMPLANT
GLOVE BIO SURGEON STRL SZ 6 (GLOVE) ×3 IMPLANT
GLOVE BIO SURGEON STRL SZ 6.5 (GLOVE) ×10 IMPLANT
GLOVE BIO SURGEON STRL SZ7 (GLOVE) ×4 IMPLANT
GLOVE BIO SURGEON STRL SZ7.5 (GLOVE) IMPLANT
GLOVE BIOGEL PI IND STRL 6 (GLOVE) IMPLANT
GLOVE BIOGEL PI IND STRL 6.5 (GLOVE) IMPLANT
GLOVE BIOGEL PI IND STRL 7.0 (GLOVE) IMPLANT
GLOVE BIOGEL PI INDICATOR 6 (GLOVE)
GLOVE BIOGEL PI INDICATOR 6.5 (GLOVE) ×4
GLOVE BIOGEL PI INDICATOR 7.0 (GLOVE) ×11
GLOVE EUDERMIC 7 POWDERFREE (GLOVE) IMPLANT
GLOVE ORTHO TXT STRL SZ7.5 (GLOVE) IMPLANT
GOWN STRL NON-REIN LRG LVL3 (GOWN DISPOSABLE) ×11 IMPLANT
HEMOSTAT POWDER SURGIFOAM 1G (HEMOSTASIS) ×5 IMPLANT
HEMOSTAT SURGICEL 2X14 (HEMOSTASIS) ×2 IMPLANT
INSERT FOGARTY 61MM (MISCELLANEOUS) IMPLANT
INSERT FOGARTY XLG (MISCELLANEOUS) IMPLANT
KIT BASIN OR (CUSTOM PROCEDURE TRAY) ×2 IMPLANT
KIT ROOM TURNOVER OR (KITS) ×2 IMPLANT
KIT SUCTION CATH 14FR (SUCTIONS) ×4 IMPLANT
KIT VASOVIEW W/TROCAR VH 2000 (KITS) ×2 IMPLANT
LEAD PACING MYOCARDI (MISCELLANEOUS) ×2 IMPLANT
MARKER GRAFT CORONARY BYPASS (MISCELLANEOUS) ×6 IMPLANT
NS IRRIG 1000ML POUR BTL (IV SOLUTION) ×11 IMPLANT
PACK OPEN HEART (CUSTOM PROCEDURE TRAY) ×2 IMPLANT
PAD ARMBOARD 7.5X6 YLW CONV (MISCELLANEOUS) ×4 IMPLANT
PENCIL BUTTON HOLSTER BLD 10FT (ELECTRODE) ×3 IMPLANT
PUNCH AORTIC ROTATE 4.0MM (MISCELLANEOUS) ×1 IMPLANT
PUNCH AORTIC ROTATE 4.5MM 8IN (MISCELLANEOUS) IMPLANT
PUNCH AORTIC ROTATE 5MM 8IN (MISCELLANEOUS) IMPLANT
SET CARDIOPLEGIA MPS 5001102 (MISCELLANEOUS) ×1 IMPLANT
SOLUTION ANTI FOG 6CC (MISCELLANEOUS) IMPLANT
SPONGE GAUZE 4X4 12PLY (GAUZE/BANDAGES/DRESSINGS) ×4 IMPLANT
SPONGE GAUZE 4X4 STERILE 39 (GAUZE/BANDAGES/DRESSINGS) ×2 IMPLANT
SPONGE INTESTINAL PEANUT (DISPOSABLE) IMPLANT
SPONGE LAP 18X18 X RAY DECT (DISPOSABLE) ×3 IMPLANT
SPONGE LAP 4X18 X RAY DECT (DISPOSABLE) IMPLANT
SUT BONE WAX W31G (SUTURE) ×1 IMPLANT
SUT MNCRL AB 4-0 PS2 18 (SUTURE) IMPLANT
SUT PROLENE 3 0 SH 1 (SUTURE) ×3 IMPLANT
SUT PROLENE 3 0 SH DA (SUTURE) ×1 IMPLANT
SUT PROLENE 3 0 SH1 36 (SUTURE) ×2 IMPLANT
SUT PROLENE 4 0 RB 1 (SUTURE)
SUT PROLENE 4 0 SH DA (SUTURE) IMPLANT
SUT PROLENE 4 0 TF (SUTURE) ×2 IMPLANT
SUT PROLENE 4-0 RB1 .5 CRCL 36 (SUTURE) IMPLANT
SUT PROLENE 5 0 C 1 36 (SUTURE) IMPLANT
SUT PROLENE 6 0 C 1 30 (SUTURE) IMPLANT
SUT PROLENE 6 0 CC (SUTURE) ×6 IMPLANT
SUT PROLENE 7 0 BV 1 (SUTURE) IMPLANT
SUT PROLENE 7 0 BV1 MDA (SUTURE) ×4 IMPLANT
SUT PROLENE 7.0 RB 3 (SUTURE) ×6 IMPLANT
SUT PROLENE 8 0 BV175 6 (SUTURE) ×3 IMPLANT
SUT SILK  1 MH (SUTURE)
SUT SILK 1 MH (SUTURE) IMPLANT
SUT SILK 2 0 SH CR/8 (SUTURE) IMPLANT
SUT SILK 3 0 SH CR/8 (SUTURE) IMPLANT
SUT STEEL 6MS V (SUTURE) ×1 IMPLANT
SUT STEEL STERNAL CCS#1 18IN (SUTURE) ×1 IMPLANT
SUT STEEL SZ 6 DBL 3X14 BALL (SUTURE) ×2 IMPLANT
SUT VIC AB 1 CTX 18 (SUTURE) ×4 IMPLANT
SUT VIC AB 1 CTX 36 (SUTURE)
SUT VIC AB 1 CTX36XBRD ANBCTR (SUTURE) IMPLANT
SUT VIC AB 2-0 CT1 27 (SUTURE) ×2
SUT VIC AB 2-0 CT1 TAPERPNT 27 (SUTURE) IMPLANT
SUT VIC AB 2-0 CTX 27 (SUTURE) IMPLANT
SUT VIC AB 3-0 SH 27 (SUTURE)
SUT VIC AB 3-0 SH 27X BRD (SUTURE) IMPLANT
SUT VIC AB 3-0 X1 27 (SUTURE) IMPLANT
SUT VICRYL 4-0 PS2 18IN ABS (SUTURE) ×1 IMPLANT
SUTURE E-PAK OPEN HEART (SUTURE) ×2 IMPLANT
SYSTEM SAHARA CHEST DRAIN ATS (WOUND CARE) ×2 IMPLANT
TOWEL OR 17X24 6PK STRL BLUE (TOWEL DISPOSABLE) ×4 IMPLANT
TOWEL OR 17X26 10 PK STRL BLUE (TOWEL DISPOSABLE) ×4 IMPLANT
TRAY FOLEY IC TEMP SENS 14FR (CATHETERS) ×2 IMPLANT
TUBE FEEDING 8FR 16IN STR KANG (MISCELLANEOUS) ×2 IMPLANT
TUBE SUCT INTRACARD DLP 20F (MISCELLANEOUS) ×2 IMPLANT
TUBING INSUFFLATION 10FT LAP (TUBING) ×2 IMPLANT
UNDERPAD 30X30 INCONTINENT (UNDERPADS AND DIAPERS) ×2 IMPLANT
WATER STERILE IRR 1000ML POUR (IV SOLUTION) ×4 IMPLANT

## 2011-03-26 NOTE — Anesthesia Postprocedure Evaluation (Signed)
  Anesthesia Post-op Note  Patient: Stacy Adams  Procedure(s) Performed:  CORONARY ARTERY BYPASS GRAFTING (CABG)  Patient Location: PACU and SICU  Anesthesia Type: General  Level of Consciousness: sedated and unresponsive  Airway and Oxygen Therapy: Patient remains intubated per anesthesia plan  Post-op Pain: none  Post-op Assessment: Post-op Vital signs reviewed and Patient's Cardiovascular Status Stable  Post-op Vital Signs: stable  Complications: No apparent anesthesia complications

## 2011-03-26 NOTE — Brief Op Note (Signed)
03/25/2011 - 03/26/2011  11:37 AM  PATIENT:  Stacy Adams  70 y.o. female  PRE-OPERATIVE DIAGNOSIS: 1. CAD 2.s/p NSTEMI  POST-OPERATIVE DIAGNOSIS:  1. CAD 2.s/p NSTEMI  PROCEDURE:  CORONARY ARTERY BYPASS GRAFTING (CABG)x4 (LIMA to LAD, SVG to Diagonal,SVG to distal Circumflex, SVG to PDA) with EVH from bilateral thighs  SURGEON:  Delight Ovens, MD  PHYSICIAN ASSISTANT: Doree Fudge PA-C  ANESTHESIA:   general  EBL:  Total I/O In: 2200 [I.V.:2200] Out: 400 [Urine:400] 2 a 1 v pacing wires Left chest tube and 1 blake mediastinal drain Two pack red blood cells for low hgb  COUNTS CORRECT:  YES  DICTATION: Other Dictation: Dictation Number   PLAN OF CARE: Admit to inpatient   PATIENT DISPOSITION:  ICU - intubated and hemodynamically stable.   Delay start of Pharmacological VTE agent (>24hrs) due to surgical blood loss or risk of bleeding:  YES

## 2011-03-26 NOTE — Transfer of Care (Signed)
Immediate Anesthesia Transfer of Care Note  Patient: Stacy Adams  Procedure(s) Performed:  CORONARY ARTERY BYPASS GRAFTING (CABG)  Patient Location: PACU and SICU  Anesthesia Type: General  Level of Consciousness: sedated and unresponsive  Airway & Oxygen Therapy: Patient remains intubated per anesthesia plan  Post-op Assessment: Post -op Vital signs reviewed and stable  Post vital signs: stable  Complications: No apparent anesthesia complications

## 2011-03-26 NOTE — Anesthesia Preprocedure Evaluation (Addendum)
Anesthesia Evaluation  Patient identified by MRN, date of birth, ID band Patient awake    Reviewed: Allergy & Precautions, H&P , NPO status , Patient's Chart, lab work & pertinent test results  Airway Mallampati: III TM Distance: <3 FB Neck ROM: full  Mouth opening: Limited Mouth Opening  Dental  (+) Dental Advidsory Given and Teeth Intact   Pulmonary shortness of breath and with exertion,  clear to auscultation        Cardiovascular hypertension, + Past MI Regular     Neuro/Psych Negative Neurological ROS     GI/Hepatic Neg liver ROS, GERD-  Controlled,  Endo/Other  Negative Endocrine ROS  Renal/GU negative Renal ROS  Genitourinary negative   Musculoskeletal   Abdominal   Peds  Hematology negative hematology ROS (+)   Anesthesia Other Findings   Reproductive/Obstetrics negative OB ROS                          Anesthesia Physical Anesthesia Plan  ASA: III  Anesthesia Plan: General ETT and General   Post-op Pain Management:    Induction: Intravenous  Airway Management Planned: Oral ETT  Additional Equipment: Arterial line, TEE and PA Cath  Intra-op Plan:   Post-operative Plan: Post-operative intubation/ventilation  Informed Consent: I have reviewed the patients History and Physical, chart, labs and discussed the procedure including the risks, benefits and alternatives for the proposed anesthesia with the patient or authorized representative who has indicated his/her understanding and acceptance.   Dental Advisory Given and Dental advisory given  Plan Discussed with:   Anesthesia Plan Comments:        Anesthesia Quick Evaluation

## 2011-03-26 NOTE — Procedures (Signed)
Extubation Procedure Note  Patient Details:   Name: Stacy Adams DOB: Jun 07, 1940 MRN: [id number]   Airway Documentation:  Patient extubated to 4 lpm nasal cannula.  VC 600 ml, NIF -30, able to hold head off bed and follow commands.  Able to breathe around deflated cuff and able to vocalize post procedure.  No complications noted.  Evaluation  O2 sats: stable throughout Complications: No apparent complications Patient did tolerate procedure well. Bilateral Breath Sounds: Clear   Yes  Adryana Mogensen, Aloha Gell 03/26/2011, 11:56 PM

## 2011-03-26 NOTE — Preoperative (Signed)
Beta Blockers   Reason not to administer Beta Blockers:Not Applicable 

## 2011-03-26 NOTE — Progress Notes (Signed)
1415 received from OR via bed, being bagged with 100% O2 into  Room 2315, pt unresponsive, Dr. Tyrone Sage here

## 2011-03-27 ENCOUNTER — Other Ambulatory Visit: Payer: Self-pay

## 2011-03-27 ENCOUNTER — Inpatient Hospital Stay (HOSPITAL_COMMUNITY): Payer: Medicare Other

## 2011-03-27 LAB — CK TOTAL AND CKMB (NOT AT ARMC)
CK, MB: 29.9 ng/mL (ref 0.3–4.0)
Relative Index: 6.9 — ABNORMAL HIGH (ref 0.0–2.5)
Total CK: 432 U/L — ABNORMAL HIGH (ref 7–177)

## 2011-03-27 LAB — CBC
HCT: 28 % — ABNORMAL LOW (ref 36.0–46.0)
HCT: 28.5 % — ABNORMAL LOW (ref 36.0–46.0)
Hemoglobin: 9.4 g/dL — ABNORMAL LOW (ref 12.0–15.0)
Hemoglobin: 9.6 g/dL — ABNORMAL LOW (ref 12.0–15.0)
MCH: 28.6 pg (ref 26.0–34.0)
MCH: 28.8 pg (ref 26.0–34.0)
MCHC: 33.7 g/dL (ref 30.0–36.0)
MCV: 85.6 fL (ref 78.0–100.0)
RBC: 3.29 MIL/uL — ABNORMAL LOW (ref 3.87–5.11)
RDW: 15.6 % — ABNORMAL HIGH (ref 11.5–15.5)

## 2011-03-27 LAB — POCT I-STAT, CHEM 8
Glucose, Bld: 124 mg/dL — ABNORMAL HIGH (ref 70–99)
HCT: 29 % — ABNORMAL LOW (ref 36.0–46.0)
Hemoglobin: 9.9 g/dL — ABNORMAL LOW (ref 12.0–15.0)
Potassium: 3.8 mEq/L (ref 3.5–5.1)
Sodium: 142 mEq/L (ref 135–145)

## 2011-03-27 LAB — GLUCOSE, CAPILLARY
Glucose-Capillary: 129 mg/dL — ABNORMAL HIGH (ref 70–99)
Glucose-Capillary: 113 mg/dL — ABNORMAL HIGH (ref 70–99)
Glucose-Capillary: 124 mg/dL — ABNORMAL HIGH (ref 70–99)
Glucose-Capillary: 115 mg/dL — ABNORMAL HIGH (ref 70–99)

## 2011-03-27 LAB — BASIC METABOLIC PANEL
CO2: 22 mEq/L (ref 19–32)
Glucose, Bld: 202 mg/dL — ABNORMAL HIGH (ref 70–99)
Potassium: 4.1 mEq/L (ref 3.5–5.1)
Sodium: 139 mEq/L (ref 135–145)

## 2011-03-27 LAB — CREATININE, SERUM: GFR calc non Af Amer: 62 mL/min — ABNORMAL LOW (ref >90)

## 2011-03-27 MED ORDER — METOPROLOL TARTRATE 25 MG PO TABS
25.0000 mg | ORAL_TABLET | Freq: Two times a day (BID) | ORAL | Status: DC
  Administered 2011-03-27: 25 mg via ORAL
  Filled 2011-03-27 (×3): qty 1

## 2011-03-27 MED ORDER — INSULIN ASPART 100 UNIT/ML ~~LOC~~ SOLN
0.0000 [IU] | SUBCUTANEOUS | Status: DC
  Administered 2011-03-27: 2 [IU] via SUBCUTANEOUS

## 2011-03-27 MED ORDER — ROSUVASTATIN CALCIUM 10 MG PO TABS
10.0000 mg | ORAL_TABLET | Freq: Every day | ORAL | Status: DC
  Administered 2011-03-27 – 2011-04-01 (×6): 10 mg via ORAL
  Filled 2011-03-27 (×7): qty 1

## 2011-03-27 MED ORDER — LISINOPRIL 2.5 MG PO TABS
2.5000 mg | ORAL_TABLET | Freq: Every day | ORAL | Status: DC
  Filled 2011-03-27: qty 1

## 2011-03-27 MED ORDER — TRAMADOL HCL 50 MG PO TABS
50.0000 mg | ORAL_TABLET | Freq: Four times a day (QID) | ORAL | Status: DC | PRN
  Administered 2011-03-28: 50 mg via ORAL
  Filled 2011-03-27: qty 1

## 2011-03-27 MED ORDER — METOPROLOL TARTRATE 25 MG/10 ML ORAL SUSPENSION
12.5000 mg | Freq: Two times a day (BID) | ORAL | Status: DC
  Filled 2011-03-27 (×3): qty 5

## 2011-03-27 MED ORDER — FUROSEMIDE 10 MG/ML IJ SOLN
40.0000 mg | Freq: Once | INTRAMUSCULAR | Status: AC
  Administered 2011-03-27: 40 mg via INTRAVENOUS
  Filled 2011-03-27: qty 4

## 2011-03-27 MED ORDER — METOPROLOL TARTRATE 12.5 MG HALF TABLET
12.5000 mg | ORAL_TABLET | ORAL | Status: AC
  Administered 2011-03-27: 12.5 mg via ORAL
  Filled 2011-03-27: qty 1

## 2011-03-27 MED ORDER — LISINOPRIL 5 MG PO TABS
5.0000 mg | ORAL_TABLET | Freq: Every day | ORAL | Status: DC
  Administered 2011-03-28: 5 mg via ORAL
  Filled 2011-03-27 (×2): qty 1

## 2011-03-27 NOTE — Progress Notes (Signed)
Patient ID: Stacy Adams, female   DOB: 05/07/1940, 70 y.o.   MRN: [id number] TCTS DAILY PROGRESS NOTE                   [address]            [place] [address]          [phone]      1 Day Post-Op Procedure(s) (LRB): CORONARY ARTERY BYPASS GRAFTING (CABG) (N/A)  Total Length of Stay:  LOS: 2 days   Subjective: mildly confused, but talkative and answers questions adequate pain control but will  try ultram  Objective: Vital signs in last 24 hours: Patient Vitals for the past 24 hrs:  BP Temp Temp src Pulse Resp SpO2 Height Weight  03/27/11 1000 139/63 mmHg 99 F (37.2 C) - 86  15  100 % - -  03/27/11 0930 - 98.8 F (37.1 C) - 87  18  100 % - -  03/27/11 0900 139/65 mmHg 98.8 F (37.1 C) - 86  14  100 % - -  03/27/11 0830 - 98.8 F (37.1 C) - 86  15  100 % - -  03/27/11 0800 120/57 mmHg 98.8 F (37.1 C) Core 83  13  98 % - -  03/27/11 0700 125/62 mmHg 98.8 F (37.1 C) Core 87  14  100 % - -  03/27/11 0645 112/55 mmHg 98.8 F (37.1 C) - 84  15  98 % - -  03/27/11 0630 145/64 mmHg 99 F (37.2 C) - 90  18  100 % - -  03/27/11 0615 148/77 mmHg 98.8 F (37.1 C) - 90  19  99 % - -  03/27/11 0600 135/68 mmHg 98.8 F (37.1 C) Core 87  16  100 % - -  03/27/11 0545 142/70 mmHg 98.6 F (37 C) - 88  14  99 % - -  03/27/11 0530 119/61 mmHg 98.6 F (37 C) - 83  12  100 % - -  03/27/11 0515 128/61 mmHg 98.6 F (37 C) - 82  13  99 % - -  03/27/11 0500 121/60 mmHg 98.6 F (37 C) Core 81  12  100 % - -  03/27/11 0445 133/68 mmHg 98.6 F (37 C) - 83  13  100 % - -  03/27/11 0430 115/69 mmHg 98.4 F (36.9 C) - 81  12  100 % - -  03/27/11 0415 127/70 mmHg 98.4 F (36.9 C) - 83  12  100 % - -  03/27/11 0400 115/62 mmHg 98.4 F (36.9 C) Core 80  12  100 % - -  03/27/11 0345 99/57 mmHg 98.2 F (36.8 C) - 79  13  100 % - -  03/27/11 0330 115/56 mmHg 98.2 F (36.8 C) - 80  12  100 % - -  03/27/11 0315 100/57 mmHg 98.4 F (36.9 C) - 80  11  99 % - -    03/27/11 0300 112/63 mmHg 98.4 F (36.9 C) Core 79  13  99 % - -  03/27/11 0245 77/60 mmHg 98.4 F (36.9 C) - 79  13  98 % - -  03/27/11 0230 78/55 mmHg 98.4 F (36.9 C) - 79  12  96 % - -  03/27/11 0215 78/49 mmHg 98.4 F (36.9 C) - 80  11  97 % - -  03/27/11 0200 81/54 mmHg 98.4 F (36.9 C) Core 80  11  97 % - -  03/27/11 0145 95/57 mmHg 98.6 F (37 C) - 79  12  99 % - -  03/27/11 0130 80/56 mmHg 98.2 F (36.8 C) - 79  17  99 % - -  03/27/11 0115 89/58 mmHg 98.2 F (36.8 C) - 79  16  99 % - -  03/27/11 0100 74/57 mmHg 98.2 F (36.8 C) Core 80  14  98 % - -  03/27/11 0045 78/57 mmHg 98.4 F (36.9 C) - 79  17  98 % - -  03/27/11 0030 83/54 mmHg 98.4 F (36.9 C) - 79  22  97 % - -  03/27/11 0015 83/54 mmHg 98.4 F (36.9 C) - 79  14  95 % - -  03/27/11 0000 89/59 mmHg 98.6 F (37 C) Core 79  11  95 % - -  03/26/11 2345 113/72 mmHg 98.6 F (37 C) - 79  19  99 % - -  03/26/11 2330 78/54 mmHg 98.8 F (37.1 C) - 80  12  98 % - -  03/26/11 2315 82/61 mmHg 98.8 F (37.1 C) - 79  11  98 % - -  03/26/11 2300 93/62 mmHg 98.8 F (37.1 C) Core 79  10  98 % - -  03/26/11 2245 88/58 mmHg 99 F (37.2 C) - 79  14  98 % - -  03/26/11 2230 83/61 mmHg 99 F (37.2 C) - 80  21  99 % - -  03/26/11 2215 77/58 mmHg 99 F (37.2 C) - 80  12  99 % - -  03/26/11 2200 96/55 mmHg 99 F (37.2 C) Core 80  13  99 % - -  03/26/11 2145 100/69 mmHg 99.1 F (37.3 C) - 79  18  99 % - -  03/26/11 2130 91/55 mmHg 99.3 F (37.4 C) - 79  12  99 % - -  03/26/11 2115 96/62 mmHg 99.3 F (37.4 C) - 79  12  99 % - -  03/26/11 2100 98/64 mmHg 99.5 F (37.5 C) Core 80  14  99 % - -  03/26/11 2045 75/53 mmHg 99.5 F (37.5 C) - 80  12  97 % - -  03/26/11 2031 - 99.7 F (37.6 C) - 80  12  98 % - -  03/26/11 2030 79/52 mmHg 99.7 F (37.6 C) Core 80  12  98 % - -  03/26/11 2015 84/59 mmHg 99.7 F (37.6 C) - 79  12  99 % - -  03/26/11 2000 105/74 mmHg 99.5 F (37.5 C) Core 79  14  99 % - -  03/26/11 1955 -  99.5 F (37.5 C) - 79  14  100 % - -  03/26/11 1945 104/68 mmHg 99.3 F (37.4 C) Core 79  13  100 % - -  03/26/11 1930 110/69 mmHg 99.1 F (37.3 C) Core 79  13  100 % - -  03/26/11 1915 - 98.6 F (37 C) Core 79  17  100 % - -  03/26/11 1900 - 98.6 F (37 C) - 79  12  100 % - -  03/26/11 1845 - 98.4 F (36.9 C) - 79  12  100 % - -  03/26/11 1830 - 98.2 F (36.8 C) - 79  12  100 % - -  03/26/11 1815 - 97.9 F (36.6 C) - 79  12  100 % - -  03/26/11 1800 - 97.3 F (36.3 C) - 79  12  100 % - -  03/26/11 1745 - 96.8 F (36 C) - 79  12  100 % - -  03/26/11 1730 - 96.4 F (35.8 C) - 79  12  100 % - -  03/26/11 1715 - 96.6 F (35.9 C) - 79  12  100 % - -  03/26/11 1700 - 96.6 F (35.9 C) - 79  12  100 % - -  03/26/11 1646 - - - 80  12  100 % - -  03/26/11 1645 - 96.6 F (35.9 C) - 79  12  100 % - -  03/26/11 1630 - 96.6 F (35.9 C) - 66  11  100 % - -  03/26/11 1615 - 96.6 F (35.9 C) - 67  17  100 % - -  03/26/11 1600 - 96.6 F (35.9 C) - 68  18  100 % - -  03/26/11 1545 - 96.8 F (36 C) - 68  21  100 % - -  03/26/11 1530 - 96.8 F (36 C) - 68  23  100 % - -  03/26/11 1515 - 96.8 F (36 C) - 69  19  100 % - -  03/26/11 1500 - 96.8 F (36 C) - 71  17  100 % 5' (1.524 m) 141 lb 1.5 oz (64 kg)  03/26/11 1445 - 96.8 F (36 C) - 72  13  - - -  03/26/11 1433 113/67 mmHg 96.8 F (36 C) Core - - 100 % - -  03/26/11 1432 126/71 mmHg - - 73  13  - - -   Wt Readings from Last 3 Encounters:  03/26/11 141 lb 1.5 oz (64 kg)  03/26/11 141 lb 1.5 oz (64 kg)  03/26/11 141 lb 1.5 oz (64 kg)    Hemodynamic parameters for last 24 hours: PAP: (21-43)/(9-29) 43/26 mmHg CO:  [2.2 L/min-3.4 L/min] 3.4 L/min CI:  [1.4 L/min/m2-2.1 L/min/m2] 2.1 L/min/m2  Intake/Output from previous day: 12/29 0701 - 12/30 0700 In: 6346.5 [I.V.:4476.5; Blood:640; IV Piggyback:1230] Out: 5180 [Urine:3255; Emesis/NG output:50; Blood:1675; Chest Tube:200]  Intake/Output this shift: Total I/O In: 190.5  [P.O.:60; I.V.:130.5] Out: 55 [Urine:55]  Current Meds: Scheduled Meds:   . acetaminophen (TYLENOL) oral liquid 160 mg/5 mL  650 mg Per Tube NOW   Or  . acetaminophen  650 mg Rectal NOW  . acetaminophen  1,000 mg Oral Q6H   Or  . acetaminophen (TYLENOL) oral liquid 160 mg/5 mL  975 mg Per Tube Q6H  . aspirin EC  325 mg Oral Daily   Or  . aspirin  324 mg Per Tube Daily  . bisacodyl  10 mg Oral Daily   Or  . bisacodyl  10 mg Rectal Daily  . cefUROXime (ZINACEF)  IV  1.5 g Intravenous Q12H  . docusate sodium  200 mg Oral Daily  . insulin aspart  0-24 Units Subcutaneous Q2H   Followed by  . insulin aspart  0-24 Units Subcutaneous Q4H  . magnesium sulfate  4 g Intravenous Once  . metoprolol tartrate  12.5 mg Oral BID   Or  . metoprolol tartrate  12.5 mg Per Tube BID  . pantoprazole  40 mg Oral Q1200  . potassium chloride  10 mEq Intravenous Q1 Hr x 3  . rosuvastatin  40 mg Oral QHS  . sodium chloride  3 mL Intravenous Q12H  . vancomycin (VANCOCIN) IVPB 1000 mg/100 mL central line  1,000 mg Intravenous Once  . vitamin C  500 mg Oral Daily  . DISCONTD: famotidine (PEPCID) IV  20 mg Intravenous Q12H  . DISCONTD: pantoprazole  40 mg Oral Q1200  . DISCONTD: pantoprazole  40 mg Oral Q1200  . DISCONTD: sodium chloride  3 mL Intravenous Q12H   Continuous Infusions:   . sodium chloride 20 mL (03/26/11 1454)  . sodium chloride    . sodium chloride    . dexmedetomidine (PRECEDEX) IV infusion Stopped (03/26/11 1851)  . insulin (NOVOLIN-R) infusion 0.7 Units/hr (03/26/11 1458)  . lactated ringers 20 mL/hr at 03/26/11 1459  . nitroGLYCERIN Stopped (03/27/11 0936)  . phenylephrine (NEO-SYNEPHRINE) Adult infusion Stopped (03/26/11 2100)  . DISCONTD: sodium chloride 100 mL/hr at 03/26/11 0134  . DISCONTD: eptifibatide 1.968 mcg/kg/min (03/26/11 0009)  . DISCONTD: heparin 750 Units/hr (03/25/11 2159)  . DISCONTD: nitroGLYCERIN 30 mcg/min (03/26/11 0100)   PRN Meds:.albumin human,  lactated ringers, metoprolol, midazolam, morphine injection, morphine, ondansetron (ZOFRAN) IV, oxyCODONE, sodium chloride, DISCONTD: sodium chloride, DISCONTD: acetaminophen, DISCONTD: ALPRAZolam, DISCONTD: diazepam, DISCONTD: hemostatic agents, DISCONTD: morphine, DISCONTD: ondansetron (ZOFRAN) IV, DISCONTD: sodium chloride, DISCONTD: Surgifoam 1 Gm with 0.9% sodium chloride (4 ml) topical solution, DISCONTD: zolpidem  General appearance: alert, cooperative and no distress Neurologic: intact Heart: regular rate and rhythm, S1, S2 normal, no murmur, click, rub or gallop and normal apical impulse Lungs: clear to auscultation bilaterally Abdomen: soft, non-tender; bowel sounds normal; no masses,  no organomegaly Extremities: extremities normal, atraumatic, no cyanosis or edema, Homans sign is negative, no sign of DVT and no edema, redness or tenderness in the calves or thighs  Lab Results: CBC: Basename 03/27/11 0414 03/26/11 2017 03/26/11 2015  WBC 12.1* -- 20.2*  HGB 9.4* 11.6* --  HCT 28.0* 34.0* --  PLT 88* -- 117*   BMET:  Basename 03/27/11 0414 03/26/11 2017 03/26/11 0330  NA 139 142 --  K 4.1 4.7 --  CL 107 109 --  CO2 22 -- 24  GLUCOSE 202* 135* --  BUN 11 8 --  CREATININE 0.71 0.80 --  CALCIUM 7.3* -- 8.1*    PT/INR:  Basename 03/26/11 1440  LABPROT 17.0*  INR 1.36   Radiology: Dg Chest Portable 1 View In Am  03/27/2011  *RADIOLOGY REPORT*  Clinical Data: Postop day #1 status post CABG.  PORTABLE CHEST - 1 VIEW  Comparison: Multiple exams, including 03/26/2011  Findings: Endotracheal and nasogastric tubes have been removed. The mediastinal drain, left-sided chest tube, and Swan-Ganz catheter remain in place.  Epicardial pacer leads noted.  Blunting left costophrenic angle suggests a small left pleural effusion.  Cardiomegaly is present, without edema.  No pneumothorax observed. Bilateral mild infrahilar airspace opacity favors atelectasis.  IMPRESSION:  1.  Small left  pleural effusion. 2.  Mild bilateral infrahilar airspace opacity favors atelectasis over pneumonia.  Original Report Authenticated By: Dellia Cloud, M.D.   Dg Chest Portable 1 View  03/26/2011  *RADIOLOGY REPORT*  Clinical Data: Coronary artery disease.  Status post CABG.  PORTABLE CHEST - 1 VIEW  Comparison: 03/25/2011  Findings:  Endotracheal tube, chest tubes and Swan-Ganz catheter are in place.  Tip of the NG tube is just beyond the gastroesophageal junction. No pneumothorax.  Minimal atelectasis at the right base medially.  IMPRESSION: Minimal atelectasis at the right base.  NG tube tip just beyond the GE junction.  No pneumothorax.  Original Report Authenticated By: Gwynn Burly, M.D.   Dg Chest Port 1 View  03/25/2011  *RADIOLOGY REPORT*  Clinical Data: MI.  Preoperative respiratory evaluation.  PORTABLE CHEST - 1 VIEW 03/25/2011 2031 hours:  Comparison: None.  Findings: Cardiac silhouette mildly enlarged for the AP portable technique.  Thoracic aorta mildly tortuous.  Hilar and mediastinal contours otherwise unremarkable.  Lungs clear.  Pulmonary vascularity normal.  No pleural effusions.  Prominent paracardiac fat pad on the left.  IMPRESSION: Mild cardiomegaly.  No acute cardiopulmonary disease.  Original Report Authenticated By: Arnell Sieving, M.D.     Assessment/Plan: S/P Procedure(s) (LRB): CORONARY ARTERY BYPASS GRAFTING (CABG) (N/A) Mobilize Diuresis Diabetes control See progression orders Expected acute  blood loss anemia   Delight Ovens MD  Beeper [phone] Office [phone] 03/27/2011 10:13 AM

## 2011-03-27 NOTE — Op Note (Signed)
Stacy Adams, BONANNO NO.:  [id number]  MEDICAL RECORD NO.:  [id number]  LOCATION:  2315                         FACILITY:  [facility]  PHYSICIAN:  Stacy Plane, MD    DATE OF BIRTH:  1940-09-10  DATE OF PROCEDURE:  03/26/2011 DATE OF DISCHARGE:                              OPERATIVE REPORT   PREOPERATIVE DIAGNOSIS:  Critical three-vessel disease with subendocardial myocardial infarction.  POSTOPERATIVE DIAGNOSIS:  Critical three-vessel disease with subendocardial myocardial infarction.  SURGICAL PROCEDURE:  Coronary artery bypass grafting x4 with the left internal mammary to the left anterior descending coronary artery, reverse saphenous vein graft to the diagonal coronary artery, reverse saphenous vein graft to the distal circumflex coronary artery, reverse saphenous vein graft to the distal right coronary artery with bilateral thigh endo-vein harvesting.  SURGEON:  Stacy Plane, MD  FIRST ASSISTANT:  Stacy Fudge, PA  BRIEF HISTORY:  The patient is a 70 year old female with no previous history of coronary artery disease who presents with a 2-3 week history of increasing episodes of chest pain.  The night prior to admission, she had prolonged chest pain during the night, then the following morning the day prior to surgery she went to the urgent care for several hours and while there began having chest pain again and was transferred urgently under STEMI protocol to the [facility].  Dr. Bryan Adams saw the patient and underwent cardiac catheterization late the evening prior to surgery.  At the time of catheterization, she had significant three-vessel coronary artery disease with mid right 95% stenosis, LAD stenosis of 90, 80-90% stenosis of the diagonal, circumflex stenosis of 95.  Overall ventricular function was preserved with ejection fraction of 45-50%.  The patient was then pain free on Integrilin and heparin.  A Cardiac Surgery consultation  was obtained and after reviewing the critical nature of the anatomy, it was recommended to the patient that we proceed with urgent coronary artery bypass grafting, which was planned for the following morning.  The risks and options of surgery were discussed with the patient and her husband in detail.  DESCRIPTION OF PROCEDURE:  With Swan-Ganz and arterial line monitors in place, the patient underwent general endotracheal anesthesia without incident.  The patient underwent transesophageal echo with a separate note dictated by Dr. Noreene Adams.  Chest and legs were prepped with Betadine and draped in usual sterile manner.  Using Guidant endo-vein harvesting system, a segment of vein was harvested from the left thigh, however, at the knee, the vein became small and additional vein incision was made in the right thigh and bilateral thigh vein was harvested with the Guidant endoscopic vein harvesting system.  Vein was of adequate quality and caliber.  Median sternotomy was performed.  Left internal mammary artery was dissected down as a pedicle graft.  The distal artery was divided and had good free flow.  Pericardium was opened.  Overall, the ventricular function appeared preserved.  The patient was systemically heparinized.  Ascending aorta was cannulated.  The right atrium was cannulated and aortic root vent cardioplegia needle was introduced into the ascending aorta.  The patient was placed on cardiopulmonary bypass 2.4 L/min/m2.  Sites anastomosed were selected and dissected out  of the epicardium.  The patient's body temperature was cooled to 32 degrees. Aortic cross-clamp was applied and 500 mL of cold blood potassium cardioplegia was administered with diastolic arrest of the heart. Myocardial septal temperatures were monitored throughout the crossclamp. Attention was turned first to the distal right coronary artery, which was slightly thickened but opened and the distal vessel  was approximately 2 mm in size.  A 1.5 mm probe passed into the PD and PL branches without difficulty.  Using a running 7-0 Prolene, distal anastomosis was performed with a segment of reverse saphenous vein graft.  Heart was elevated and the distal circumflex vessel was located, opened, and  admitted a 1.5 mm probe proximally and distally.  Prior to the bifurcation of the first OM and the distal circumflex, there was a 95% stenosis.  The vessel was relatively thin-walled.  A segment of reverse saphenous vein graft was anastomosed with a running 8-0 Prolene. Additional cold blood cardioplegia was administered down the vein graft. Attention was then turned to the diagonal coronary artery which was slightly small and trifurcated at the midportion of the vessel, but it did admit a 1.5 mm probe distally.  Using a running 8-0 Prolene, a segment of reverse saphenous vein graft was anastomosed to the diagonal coronary artery.  Between the mid and distal third of the left anterior descending coronary artery the vessel was opened.  There was diffuse disease throughout the distal vessel.  A 1.5 mm probe passed distally. Using a running 8-0 Prolene, left internal mammary artery was anastomosed to the left anterior descending coronary artery.  With the crossclamp still in place, 3 punch aortotomies were performed.  Each of the 3 vein grafts were anastomosed to the ascending aorta.  Air was evacuated from the grafts and the ascending aorta.  Bulldog was removed from the mammary artery, and there was prompt rise in myocardial septal temperature.  Aortic crossclamp was removed with a total cross-clamp time of 90 minutes.  The patient spontaneously converted to a slow sinus rhythm.  She transiently required pacing.  Sites of anastomosis were inspected and were free of bleeding.  She was started on low dose of dopamine and was ventilated and weaned from cardiopulmonary bypass. Initially, the right  ventricle was slightly sluggish possibly from air in the graft, however, this quickly resolved and the patient separated from cardiopulmonary bypass without difficulty, remained hemodynamically stable.  She was decannulated in the usual fashion.  Protamine sulfate was administered with operative field hemostatic.  Atrial and ventricular pacing wires had been applied.  Graft markers were applied. The left pleural tube and a Blake mediastinal drain were left in place. Pericardium was loosely reapproximated.  Sternum was closed with #6 stainless steel wire.  Fascia closed with interrupted 0 Vicryl, running 3-0 Vicryl in subcutaneous tissue, 3-0 subcuticular stitch in skin edges.  Dry dressings were applied.  Sponge and needle count was reported as correct at the completion of the procedure.  Total pump time was 138 minutes.  Because of hematocrit as low as 17 on cardiopulmonary bypass, 2 units of packed red blood cells were given while on bypass.     Stacy Plane, MD     EG/MEDQ  D:  03/27/2011  T:  03/27/2011  Job:  [id number]  cc:   Landry Corporal, MD

## 2011-03-27 NOTE — Progress Notes (Signed)
ANESTHESIA PROGRESS NOTE  Subjective: Stacy Adams is awake and alert and complaining of mild incisional pain with deep breathing. Temperature is 36 8 and respiratory rate is 12. Blood pressure is 135/78 and heart rate is 82 with sinus rhythm. Her oxygen saturation is 99% on 4 L nasal cannula.  She was extubated at 2345 last night which is 9 hours postoperatively. She appears stable and has had an uneventful postoperative course so far. Labs: Lab Results  Component Value Date   WBC 12.1* 03/27/2011   HGB 9.4* 03/27/2011   HCT 28.0* 03/27/2011   MCV 85.1 03/27/2011   PLT 88* 03/27/2011   Lab Results  Component Value Date   NA 142 03/26/2011   K 4.7 03/26/2011   CL 109 03/26/2011   CO2 24 03/26/2011   BUN 8 03/26/2011   CREATININE 0.80 03/26/2011   GLUCOSE 135* 03/26/2011   ABG    Component Value Date/Time   PHART 7.313* 03/26/2011 2329   PCO2ART 45.4* 03/26/2011 2329   PO2ART 96.0 03/26/2011 2329   HCO3 22.9 03/26/2011 2329   TCO2 24 03/26/2011 2329   ACIDBASEDEF 3.0* 03/26/2011 2329   O2SAT 97.0 03/26/2011 2329    Other findings:   Physical Exam: There were no vitals filed for this visit.  Mental status: Alert and oriented    Assessment/Plan: 70 y.o. female 1 day status post coronary artery bypass grafting x 4. She has had an uneventful postoperative course so far.  Melonie Florida, MD 03/27/2011 4:46 AM

## 2011-03-27 NOTE — Progress Notes (Signed)
1 Day Post-Op  Procedure(s) (LRB):  CORONARY ARTERY BYPASS GRAFTING (CABG) X 4 (LIMA TO LAD,SVG to Diagonal,SVG to distal Circumflex, SVG to PDA  (N/A)    Subjective: No specific complaints.  Objective: Vital signs in last 24 hours: Temp:  [96.4 F (35.8 C)-99.7 F (37.6 C)] 99 F (37.2 C) (12/30 1117) Pulse Rate:  [66-90] 90  (12/30 1100) Resp:  [10-23] 16  (12/30 1100) BP: (74-155)/(49-84) 155/84 mmHg (12/30 1100) SpO2:  [95 %-100 %] 99 % (12/30 1100) Arterial Line BP: (94-175)/(45-99) 155/99 mmHg (12/30 1000) FiO2 (%):  [40 %-50 %] 40 % (12/29 2300) Weight:  [64 kg (141 lb 1.5 oz)] 141 lb 1.5 oz (64 kg) (12/29 1500) Weight change: -0.8 kg (-1 lb 12.2 oz)     PAP 43/26 Intake/Output from previous day: +1160 12/29 0701 - 12/30 0700 In: 6346.5 [I.V.:4476.5; Blood:640; IV Piggyback:1230] Out: 5180 [Urine:3255; Emesis/NG output:50; Blood:1675; Chest Tube:200] Intake/Output this shift: Total I/O In: 210.5 [P.O.:60; I.V.:150.5] Out: 135 [Urine:115; Chest Tube:20]  PE: General appearance: alert, cooperative, no distress, mildly obese and tired Neck: no adenopathy, no carotid bruit, no JVD, supple, symmetrical, trachea midline, thyroid not enlarged, symmetric, no tenderness/mass/nodules and RIJ sheath in place Lungs: clear to auscultation bilaterally and with minimal bibasal rales Heart: regular rate and rhythm, S1, S2 normal, no S3 or S4, no click and friction rub heard throughout - post-pericardiotomy Abdomen: soft, non-tender; bowel sounds normal; no masses,  no organomegaly and mildly abnormal Extremities: extremities normal, atraumatic, no cyanosis or edema Pulses: 2+ and symmetric Neurologic: Grossly normal, a bit groggy.   Basename 03/27/11 0414 03/26/11 2017 03/26/11 2015  WBC 12.1* -- 20.2*  HGB 9.4* 11.6* --  HCT 28.0* 34.0* --  PLT 88* -- 117*   BMET  Basename 03/27/11 0414 03/26/11 2017 03/26/11 0330  NA 139 142 --  K 4.1 4.7 --  CL 107 109 --  CO2 22 --  24  GLUCOSE 202* 135* --  BUN 11 8 --  CREATININE 0.71 0.80 --  CALCIUM 7.3* -- 8.1*    Basename 03/26/11 1440 03/26/11  TROPONINI 3.45* 2.44*    Lab Results  Component Value Date   CHOL 272* 03/25/2011   HDL 37* 03/25/2011   LDLCALC 196* 03/25/2011   LDLDIRECT 203.5 01/09/2008   TRIG 197* 03/25/2011   CHOLHDL 7.4 03/25/2011   Lab Results  Component Value Date   HGBA1C 5.9* 03/25/2011     Lab Results  Component Value Date   TSH 3.830 03/25/2011    Hepatic Function Panel  Basename 03/25/11 1722  PROT 6.3  ALBUMIN 3.6  AST 32  ALT 28  ALKPHOS 87  BILITOT 0.6  BILIDIR --  IBILI --    Basename 03/25/11 1722  CHOL 272*   No results found for this basename: PROTIME in the last 72 hours  EKG: Reviwed NSR - Inf Q waves  Studies/Results: Dg Chest Portable 1 View In Am  03/27/2011  *RADIOLOGY REPORT*  Clinical Data: Postop day #1 status post CABG.  PORTABLE CHEST - 1 VIEW  Comparison: Multiple exams, including 03/26/2011  Findings: Endotracheal and nasogastric tubes have been removed. The mediastinal drain, left-sided chest tube, and Swan-Ganz catheter remain in place.  Epicardial pacer leads noted.  Blunting left costophrenic angle suggests a small left pleural effusion.  Cardiomegaly is present, without edema.  No pneumothorax observed. Bilateral mild infrahilar airspace opacity favors atelectasis.  IMPRESSION:  1.  Small left pleural effusion. 2.  Mild bilateral infrahilar airspace opacity  favors atelectasis over pneumonia.  Original Report Authenticated By: Dellia Cloud, M.D.   Dg Chest Portable 1 View  03/26/2011  *RADIOLOGY REPORT*  Clinical Data: Coronary artery disease.  Status post CABG.  PORTABLE CHEST - 1 VIEW  Comparison: 03/25/2011  Findings:  Endotracheal tube, chest tubes and Swan-Ganz catheter are in place.  Tip of the NG tube is just beyond the gastroesophageal junction. No pneumothorax.  Minimal atelectasis at the right base medially.   IMPRESSION: Minimal atelectasis at the right base.  NG tube tip just beyond the GE junction.  No pneumothorax.  Original Report Authenticated By: Gwynn Burly, M.D.   Dg Chest Port 1 View  03/25/2011  *RADIOLOGY REPORT*  Clinical Data: MI.  Preoperative respiratory evaluation.  PORTABLE CHEST - 1 VIEW 03/25/2011 2031 hours:  Comparison: None.  Findings: Cardiac silhouette mildly enlarged for the AP portable technique.  Thoracic aorta mildly tortuous.  Hilar and mediastinal contours otherwise unremarkable.  Lungs clear.  Pulmonary vascularity normal.  No pleural effusions.  Prominent paracardiac fat pad on the left.  IMPRESSION: Mild cardiomegaly.  No acute cardiopulmonary disease.  Original Report Authenticated By: Arnell Sieving, M.D.    Medications: I have reviewed the patient's current medications.  Assessment/Plan: Patient Active Problem List  Diagnoses  . HYPERLIPIDEMIA  . ANXIETY  . HYPERTENSION  . GERD  . OSTEOPENIA  . NSTEMI (non-ST elevated myocardial infarction) Urgent cath  . CAD, multiple vessel  POD #1 CABG. Progressing well. Blood loss anemia secondary to surgery Volume overload - diurese per CT Sgx  PLAN:  Progressing well off drips. Pk. Troponin 3.45   LOS: 2 days   INGOLD,LAURA R 03/27/2011, 11:35 AM  Pt seen & examined -- exam above by me. Chart reviewed.    Would increase BB & ACE-I dose. On statin. Minimal volume up - diurese per CT Sgx.  Marykay Lex, M.D., M.S. [facility] [address] [place], [place]  [address]  [phone]  03/27/2011 11:55 AM

## 2011-03-27 NOTE — Op Note (Signed)
Procedure: Intraoperative transesophageal echocardiography.  Ms. Stacy Adams is a 70 year old female who suffered a non-ST segment elevation myocardial infarction. She was found to have severe three-vessel coronary artery disease with preserved left ventricular function and is now scheduled to undergo coronary artery bypass grafting. Intraoperative transesophageal echocardiography was requested to assess the right and left ventricular function  and to determine if any valvular pathology was present.  The patient was brought to the operating room as his current hospital and general anesthesia was induced without difficulty. The trachea was intubated without difficulty. The transesophageal echocardiography probe was then inserted into the esophagus without difficulty.  Impression: Pre-bypass findings:  1. Mitral valve: The mitral leaflets were thin and pliable and coapted normally. There was no billowing or prolapsing segments noted. There was trace mitral insufficiency and mild mitral annular calcification. The valve opened normally.  2. Aortic valve: The aortic leaflets were not calcified and they opened normally. There was no aortic insufficiency noted.  3. Left ventricle: The left ventricle showed good contractility in all segments interrogated. The ejection fraction was estimated at 60-65%. There was mild left ventricular hypertrophy which was concentric. Left ventricular wall thickness measured 1.1-1.15 cm at end diastole at the mid-papillary level. Left ventricular end-diastolic diameter measured 4.6 cm at the mid-papillary level.  4. Right ventricle: The right ventricular chamber showed normal size and there was good contractility of the right ventricular free wall and normal-appearing right ventricular function.  5. Tricuspid valve: The tricuspid leaflets appeared structurally intact and there was trace tricuspid insufficiency noted.  6. Inter-atrial septum: The inter-atrial septum  appeared normal and there was no evidence of atrial septal defect or patent foramen ovale by color Doppler or bubble study.  7. Left atrium the left atrial cavity appeared to be normal in size and there was no thrombus noted in the left atrium or left atrial appendage.  8.  Ascending aorta: Ascending aorta was of normal size and there was no significant atheromatous disease that could be appreciated.  Post-bypass findings:  1. Mitral valve: The mitral valve appeared unchanged from the pre-bypass study there was trace mitral insufficiency.  2. Aortic valve: The aortic valve appeared normal and is unchanged from the pre-bypass study.  3. Left ventricle: Initial views of the left ventricle showed dyssynchrony of contraction do to ventricular pacing. However all segments were contracting. There was initially a small air pocket noted in the left ventricular apex following separation from cardio-pulmonary  Bypass. This resolved over the next 15 minutes. When atrial pacing could be commenced the left ventricular function appeared normal and unchanged from the pre-bypass study with vigorous contractility in all segments interrogated.  4. Right ventricle: The right ventricular function appeared depressed upon initial separation from cardiopulmonary bypass. This resolved over the next 15-20 minutes. At the time the chest was closed, the right ventricular function appeared normal.  5. Tricuspid valve: There was trace tricuspid insufficiency.

## 2011-03-28 ENCOUNTER — Inpatient Hospital Stay (HOSPITAL_COMMUNITY): Payer: Medicare Other

## 2011-03-28 LAB — BASIC METABOLIC PANEL
BUN: 19 mg/dL (ref 6–23)
CO2: 27 mEq/L (ref 19–32)
Calcium: 8.4 mg/dL (ref 8.4–10.5)
Chloride: 105 mEq/L (ref 96–112)
Creatinine, Ser: 1.01 mg/dL (ref 0.50–1.10)
GFR calc Af Amer: 63 mL/min — ABNORMAL LOW (ref >90)
GFR calc non Af Amer: 54 mL/min — ABNORMAL LOW (ref >90)
Glucose, Bld: 116 mg/dL — ABNORMAL HIGH (ref 70–99)
Potassium: 3.5 mEq/L (ref 3.5–5.1)
Sodium: 139 mEq/L (ref 135–145)

## 2011-03-28 LAB — CBC
HCT: 28.2 % — ABNORMAL LOW (ref 36.0–46.0)
Hemoglobin: 9.4 g/dL — ABNORMAL LOW (ref 12.0–15.0)
MCH: 28.9 pg (ref 26.0–34.0)
MCHC: 33.3 g/dL (ref 30.0–36.0)
MCV: 86.8 fL (ref 78.0–100.0)
Platelets: 126 10*3/uL — ABNORMAL LOW (ref 150–400)
RBC: 3.25 MIL/uL — ABNORMAL LOW (ref 3.87–5.11)
RDW: 15.7 % — ABNORMAL HIGH (ref 11.5–15.5)
WBC: 16.6 10*3/uL — ABNORMAL HIGH (ref 4.0–10.5)

## 2011-03-28 LAB — GLUCOSE, CAPILLARY
Glucose-Capillary: 112 mg/dL — ABNORMAL HIGH (ref 70–99)
Glucose-Capillary: 113 mg/dL — ABNORMAL HIGH (ref 70–99)
Glucose-Capillary: 126 mg/dL — ABNORMAL HIGH (ref 70–99)

## 2011-03-28 MED ORDER — MOVING RIGHT ALONG BOOK
Freq: Once | Status: AC
  Administered 2011-03-28: 10:00:00
  Filled 2011-03-28: qty 1

## 2011-03-28 MED ORDER — BISACODYL 5 MG PO TBEC
10.0000 mg | DELAYED_RELEASE_TABLET | Freq: Every day | ORAL | Status: DC | PRN
  Administered 2011-03-28: 10 mg via ORAL
  Filled 2011-03-28: qty 2

## 2011-03-28 MED ORDER — ACETAMINOPHEN 325 MG PO TABS
650.0000 mg | ORAL_TABLET | Freq: Four times a day (QID) | ORAL | Status: DC | PRN
  Administered 2011-04-01: 650 mg via ORAL
  Filled 2011-03-28: qty 2

## 2011-03-28 MED ORDER — POTASSIUM CHLORIDE 10 MEQ/50ML IV SOLN
INTRAVENOUS | Status: AC
  Administered 2011-03-28: 10 meq via INTRAVENOUS
  Filled 2011-03-28: qty 150

## 2011-03-28 MED ORDER — ONDANSETRON HCL 4 MG PO TABS: 4.0000 mg | ORAL_TABLET | Freq: Four times a day (QID) | ORAL | Status: DC | PRN

## 2011-03-28 MED ORDER — SODIUM CHLORIDE 0.9 % IJ SOLN
3.0000 mL | Freq: Two times a day (BID) | INTRAMUSCULAR | Status: DC
  Administered 2011-03-28 – 2011-04-01 (×10): 3 mL via INTRAVENOUS

## 2011-03-28 MED ORDER — POTASSIUM CHLORIDE 10 MEQ/50ML IV SOLN
10.0000 meq | INTRAVENOUS | Status: AC
  Administered 2011-03-28 (×3): 10 meq via INTRAVENOUS

## 2011-03-28 MED ORDER — POVIDONE-IODINE 10 % EX SOLN
1.0000 "application " | Freq: Two times a day (BID) | CUTANEOUS | Status: DC
  Administered 2011-03-28 – 2011-04-02 (×11): 1 via TOPICAL
  Filled 2011-03-28: qty 15

## 2011-03-28 MED ORDER — TRAMADOL HCL 50 MG PO TABS: 50.0000 mg | ORAL_TABLET | ORAL | Status: DC | PRN

## 2011-03-28 MED ORDER — ONDANSETRON HCL 4 MG/2ML IJ SOLN
4.0000 mg | Freq: Four times a day (QID) | INTRAMUSCULAR | Status: DC | PRN
  Administered 2011-03-28: 4 mg via INTRAVENOUS
  Filled 2011-03-28: qty 2

## 2011-03-28 MED ORDER — SODIUM CHLORIDE 0.9 % IJ SOLN: 3.0000 mL | INTRAMUSCULAR | Status: DC | PRN

## 2011-03-28 MED ORDER — PANTOPRAZOLE SODIUM 40 MG PO TBEC
40.0000 mg | DELAYED_RELEASE_TABLET | Freq: Every day | ORAL | Status: DC
  Administered 2011-03-28 – 2011-04-02 (×6): 40 mg via ORAL
  Filled 2011-03-28 (×6): qty 1

## 2011-03-28 MED ORDER — ASPIRIN EC 325 MG PO TBEC
325.0000 mg | DELAYED_RELEASE_TABLET | Freq: Every day | ORAL | Status: DC
  Administered 2011-03-28 – 2011-04-02 (×6): 325 mg via ORAL
  Filled 2011-03-28 (×6): qty 1

## 2011-03-28 MED ORDER — DOCUSATE SODIUM 100 MG PO CAPS
200.0000 mg | ORAL_CAPSULE | Freq: Every day | ORAL | Status: DC
  Administered 2011-03-28 – 2011-04-01 (×4): 200 mg via ORAL
  Filled 2011-03-28 (×6): qty 2

## 2011-03-28 MED ORDER — INSULIN ASPART 100 UNIT/ML ~~LOC~~ SOLN
0.0000 [IU] | Freq: Three times a day (TID) | SUBCUTANEOUS | Status: DC
  Administered 2011-03-28: 2 [IU] via SUBCUTANEOUS
  Filled 2011-03-28: qty 3

## 2011-03-28 MED ORDER — ENOXAPARIN SODIUM 30 MG/0.3ML ~~LOC~~ SOLN
30.0000 mg | SUBCUTANEOUS | Status: DC
  Administered 2011-03-29 – 2011-04-01 (×5): 30 mg via SUBCUTANEOUS
  Filled 2011-03-28 (×7): qty 0.3

## 2011-03-28 MED ORDER — BISACODYL 10 MG RE SUPP: 10.0000 mg | Freq: Every day | RECTAL | Status: DC | PRN

## 2011-03-28 MED FILL — Dexmedetomidine HCl IV Soln 200 MCG/2ML: INTRAVENOUS | Qty: 2 | Status: AC

## 2011-03-28 MED FILL — Potassium Chloride Inj 2 mEq/ML: INTRAVENOUS | Qty: 40 | Status: AC

## 2011-03-28 MED FILL — Magnesium Sulfate Inj 50%: INTRAMUSCULAR | Qty: 10 | Status: AC

## 2011-03-28 NOTE — Progress Notes (Signed)
UR Completed.  Stacy Adams [phone]. 03/28/2011

## 2011-03-28 NOTE — Progress Notes (Signed)
Patient ID: Stacy Adams, female   DOB: 04-07-1940, 70 y.o.   MRN: [id number] [facility] DAILY PROGRESS NOTE                   [address]            [place] [address]          [phone]      2 Days Post-Op Procedure(s) (LRB): CORONARY ARTERY BYPASS GRAFTING (CABG) (N/A)  Total Length of Stay:  LOS: 3 days   Subjective: Mild nausea, did not walk last night  Objective: Vital signs in last 24 hours: Patient Vitals for the past 24 hrs:  BP Temp Temp src Pulse Resp SpO2 Weight  03/28/11 0600 147/65 mmHg - - 75  17  98 % 155 lb 3.3 oz (70.4 kg)  03/28/11 0500 129/60 mmHg - - 73  15  98 % -  03/28/11 0400 156/62 mmHg - - 81  19  99 % -  03/28/11 0332 - 98.1 F (36.7 C) Oral - - - -  03/28/11 0300 138/59 mmHg - - 68  13  98 % -  03/28/11 0200 142/57 mmHg - - 72  14  98 % -  03/28/11 0100 117/46 mmHg - - 66  12  98 % -  03/28/11 0000 127/55 mmHg - - 72  14  98 % -  03/27/11 2320 - 98.2 F (36.8 C) Oral - - - -  03/27/11 2300 141/57 mmHg - - 76  17  98 % -  03/27/11 2200 146/67 mmHg - - 80  19  99 % -  03/27/11 2100 131/53 mmHg - - 74  14  97 % -  03/27/11 2000 147/55 mmHg - - 78  17  99 % -  03/27/11 1936 - 97.8 F (36.6 C) Oral - - - -  03/27/11 1900 116/49 mmHg - - 69  15  99 % -  03/27/11 1800 110/47 mmHg - - 73  15  100 % -  03/27/11 1700 116/42 mmHg - - 70  12  98 % -  03/27/11 1600 101/86 mmHg - - 66  10  98 % -  03/27/11 1521 - 98.5 F (36.9 C) Oral - - - -  03/27/11 1500 113/53 mmHg - - 73  11  98 % -  03/27/11 1400 139/71 mmHg - - 92  23  97 % -  03/27/11 1300 129/49 mmHg - - 89  17  99 % -  03/27/11 1200 141/55 mmHg - - 86  14  99 % -  03/27/11 1117 - 99 F (37.2 C) Oral - - - -  03/27/11 1100 155/84 mmHg - - 90  16  99 % -  03/27/11 1000 139/63 mmHg 99 F (37.2 C) - 86  15  100 % -  03/27/11 0930 - 98.8 F (37.1 C) - 87  18  100 % -  03/27/11 0900 139/65 mmHg 98.8 F (37.1 C) - 86  14  100 % -  03/27/11 0830 - 98.8 F (37.1 C) - 86  15   100 % -  03/27/11 0800 120/57 mmHg 98.8 F (37.1 C) Core 83  13  98 % -   Wt Readings from Last 3 Encounters:  03/28/11 155 lb 3.3 oz (70.4 kg)  03/28/11 155 lb 3.3 oz (70.4 kg)  03/28/11 155 lb 3.3 oz (70.4 kg)   Intake/Output from previous day: 12/30 0701 -  12/31 0700 In: 958.5 [P.O.:540; I.V.:310.5; IV Piggyback:108] Out: 1700 [Urine:1680; Chest Tube:20]     Current Meds: Scheduled Meds:   . acetaminophen  1,000 mg Oral Q6H   Or  . acetaminophen (TYLENOL) oral liquid 160 mg/5 mL  975 mg Per Tube Q6H  . aspirin EC  325 mg Oral Daily   Or  . aspirin  324 mg Per Tube Daily  . bisacodyl  10 mg Oral Daily   Or  . bisacodyl  10 mg Rectal Daily  . cefUROXime (ZINACEF)  IV  1.5 g Intravenous Q12H  . docusate sodium  200 mg Oral Daily  . furosemide  40 mg Intravenous Once  . furosemide  40 mg Intravenous Once  . insulin aspart  0-24 Units Subcutaneous Q4H  . lisinopril  5 mg Oral Daily  . metoprolol tartrate  25 mg Oral BID   Or  . metoprolol tartrate  12.5 mg Per Tube BID  . metoprolol tartrate  12.5 mg Oral NOW  . pantoprazole  40 mg Oral Q1200  . rosuvastatin  10 mg Oral QHS  . sodium chloride  3 mL Intravenous Q12H  . vitamin C  500 mg Oral Daily  . DISCONTD: insulin aspart  0-24 Units Subcutaneous Q4H  . DISCONTD: lisinopril  2.5 mg Oral Daily  . DISCONTD: metoprolol tartrate  12.5 mg Per Tube BID  . DISCONTD: metoprolol tartrate  12.5 mg Oral BID  . DISCONTD: rosuvastatin  40 mg Oral QHS   Continuous Infusions:   . sodium chloride 20 mL (03/26/11 1454)  . sodium chloride    . sodium chloride    . dexmedetomidine (PRECEDEX) IV infusion Stopped (03/26/11 1851)  . lactated ringers 20 mL/hr at 03/26/11 1459  . nitroGLYCERIN Stopped (03/27/11 0936)  . phenylephrine (NEO-SYNEPHRINE) Adult infusion Stopped (03/26/11 2100)  . DISCONTD: insulin (NOVOLIN-R) infusion 0.7 Units/hr (03/26/11 1458)   PRN Meds:.albumin human, metoprolol, midazolam, morphine, ondansetron  (ZOFRAN) IV, oxyCODONE, sodium chloride, traMADol  General appearance: alert, cooperative and no distress Neurologic: intact Heart: regular rate and rhythm, S1, S2 normal, no murmur, click, rub or gallop Lungs: clear to auscultation bilaterally Abdomen: soft, non-tender; bowel sounds normal; no masses,  no organomegaly Extremities: extremities normal, atraumatic, no cyanosis or edema, Homans sign is negative, no sign of DVT and no edema, redness or tenderness in the calves or thighs Wound: sternum stable  Lab Results: CBC: Basename 03/28/11 0430 03/27/11 1530 03/27/11 1500  WBC 16.6* -- 16.7*  HGB 9.4* 9.9* --  HCT 28.2* 29.0* --  PLT 126* -- 129*   BMET:  Basename 03/28/11 0430 03/27/11 1530 03/27/11 0414  NA 139 142 --  K 3.5 3.8 --  CL 105 105 --  CO2 27 -- 22  GLUCOSE 116* 124* --  BUN 19 15 --  CREATININE 1.01 1.10 --  CALCIUM 8.4 -- 7.3*    PT/INR:  Basename 03/26/11 1440  LABPROT 17.0*  INR 1.36   Radiology: Dg Chest Portable 1 View In Am  03/27/2011  *RADIOLOGY REPORT*  Clinical Data: Postop day #1 status post CABG.  PORTABLE CHEST - 1 VIEW  Comparison: Multiple exams, including 03/26/2011  Findings: Endotracheal and nasogastric tubes have been removed. The mediastinal drain, left-sided chest tube, and Swan-Ganz catheter remain in place.  Epicardial pacer leads noted.  Blunting left costophrenic angle suggests a small left pleural effusion.  Cardiomegaly is present, without edema.  No pneumothorax observed. Bilateral mild infrahilar airspace opacity favors atelectasis.  IMPRESSION:  1.  Small left pleural effusion. 2.  Mild bilateral infrahilar airspace opacity favors atelectasis over pneumonia.  Original Report Authenticated By: Dellia Cloud, M.D.   Dg Chest Portable 1 View  03/26/2011  *RADIOLOGY REPORT*  Clinical Data: Coronary artery disease.  Status post CABG.  PORTABLE CHEST - 1 VIEW  Comparison: 03/25/2011  Findings:  Endotracheal tube, chest tubes and  Swan-Ganz catheter are in place.  Tip of the NG tube is just beyond the gastroesophageal junction. No pneumothorax.  Minimal atelectasis at the right base medially.  IMPRESSION: Minimal atelectasis at the right base.  NG tube tip just beyond the GE junction.  No pneumothorax.  Original Report Authenticated By: Gwynn Burly, M.D.     Assessment/Plan: S/P Procedure(s) (LRB): CORONARY ARTERY BYPASS GRAFTING (CABG) (N/A) Mobilize Diuresis d/c tubes/lines Plan for transfer to step-down: see transfer orders     Delight Ovens MD  Beeper [phone] Office [phone] 03/28/2011 7:35 AM

## 2011-03-28 NOTE — Significant Event (Signed)
Pt transferred to 2000 safely. VS stable. Report given to receiving RN. Pt spouse at bedside. Iola Turri, Charity fundraiser.

## 2011-03-29 ENCOUNTER — Inpatient Hospital Stay (HOSPITAL_COMMUNITY): Payer: Medicare Other

## 2011-03-29 DIAGNOSIS — D62 Acute posthemorrhagic anemia: Secondary | ICD-10-CM | POA: Diagnosis not present

## 2011-03-29 DIAGNOSIS — I480 Paroxysmal atrial fibrillation: Secondary | ICD-10-CM

## 2011-03-29 HISTORY — DX: Paroxysmal atrial fibrillation: I48.0

## 2011-03-29 LAB — BASIC METABOLIC PANEL
BUN: 20 mg/dL (ref 6–23)
CO2: 28 mEq/L (ref 19–32)
Calcium: 8.9 mg/dL (ref 8.4–10.5)
Chloride: 105 mEq/L (ref 96–112)
Creatinine, Ser: 0.73 mg/dL (ref 0.50–1.10)
GFR calc Af Amer: >90 mL/min (ref >90)
GFR calc non Af Amer: 83 mL/min — ABNORMAL LOW (ref >90)
Glucose, Bld: 91 mg/dL (ref 70–99)
Potassium: 3.5 mEq/L (ref 3.5–5.1)
Sodium: 142 mEq/L (ref 135–145)

## 2011-03-29 LAB — CBC
HCT: 26.5 % — ABNORMAL LOW (ref 36.0–46.0)
Hemoglobin: 8.7 g/dL — ABNORMAL LOW (ref 12.0–15.0)
MCH: 28.8 pg (ref 26.0–34.0)
MCHC: 32.8 g/dL (ref 30.0–36.0)
MCV: 87.7 fL (ref 78.0–100.0)
Platelets: 150 10*3/uL (ref 150–400)
RBC: 3.02 MIL/uL — ABNORMAL LOW (ref 3.87–5.11)
RDW: 15.7 % — ABNORMAL HIGH (ref 11.5–15.5)
WBC: 11.9 10*3/uL — ABNORMAL HIGH (ref 4.0–10.5)

## 2011-03-29 LAB — TYPE AND SCREEN
ABO/RH(D): O POS
Antibody Screen: NEGATIVE
Unit division: 0
Unit division: 0
Unit division: 0
Unit division: 0

## 2011-03-29 LAB — GLUCOSE, CAPILLARY
Glucose-Capillary: 89 mg/dL (ref 70–99)
Glucose-Capillary: 96 mg/dL (ref 70–99)

## 2011-03-29 MED ORDER — POTASSIUM CHLORIDE CRYS ER 20 MEQ PO TBCR
20.0000 meq | EXTENDED_RELEASE_TABLET | Freq: Every day | ORAL | Status: DC
  Administered 2011-03-29: 20 meq via ORAL
  Filled 2011-03-29 (×2): qty 1

## 2011-03-29 MED ORDER — POLYSACCHARIDE IRON 150 MG PO CAPS
150.0000 mg | ORAL_CAPSULE | Freq: Every day | ORAL | Status: DC
  Administered 2011-03-29 – 2011-04-02 (×5): 150 mg via ORAL
  Filled 2011-03-29 (×5): qty 1

## 2011-03-29 MED ORDER — METOPROLOL TARTRATE 1 MG/ML IV SOLN
INTRAVENOUS | Status: AC
  Filled 2011-03-29: qty 5

## 2011-03-29 MED ORDER — METOPROLOL TARTRATE 12.5 MG HALF TABLET
12.5000 mg | ORAL_TABLET | Freq: Two times a day (BID) | ORAL | Status: DC
  Filled 2011-03-29 (×2): qty 1

## 2011-03-29 MED ORDER — FOLIC ACID 1 MG PO TABS
1.0000 mg | ORAL_TABLET | Freq: Every day | ORAL | Status: DC
  Administered 2011-03-29 – 2011-04-02 (×5): 1 mg via ORAL
  Filled 2011-03-29 (×5): qty 1

## 2011-03-29 MED ORDER — METOPROLOL TARTRATE 1 MG/ML IV SOLN
2.5000 mg | INTRAVENOUS | Status: DC | PRN
  Administered 2011-03-29: 2.5 mg via INTRAVENOUS

## 2011-03-29 MED ORDER — LISINOPRIL 2.5 MG PO TABS
2.5000 mg | ORAL_TABLET | Freq: Every day | ORAL | Status: DC
  Administered 2011-03-29 – 2011-04-02 (×5): 2.5 mg via ORAL
  Filled 2011-03-29 (×4): qty 1

## 2011-03-29 MED ORDER — POTASSIUM CHLORIDE 20 MEQ PO PACK: 20.0000 meq | PACK | Freq: Every day | ORAL | Status: DC

## 2011-03-29 MED ORDER — LACTULOSE 10 GM/15ML PO SOLN
20.0000 g | Freq: Once | ORAL | Status: AC
  Administered 2011-03-29: 20 g via ORAL
  Filled 2011-03-29: qty 30

## 2011-03-29 MED ORDER — FUROSEMIDE 40 MG PO TABS
40.0000 mg | ORAL_TABLET | Freq: Every day | ORAL | Status: DC
  Administered 2011-03-29 – 2011-03-30 (×2): 40 mg via ORAL
  Filled 2011-03-29 (×3): qty 1

## 2011-03-29 MED ORDER — METOPROLOL TARTRATE 25 MG PO TABS
25.0000 mg | ORAL_TABLET | Freq: Two times a day (BID) | ORAL | Status: DC
  Administered 2011-03-29 (×2): 25 mg via ORAL
  Filled 2011-03-29 (×3): qty 1

## 2011-03-29 NOTE — Progress Notes (Signed)
Patient having multiple bowel movements from lactulose; refused to walk in hallway until bowel movements have decreased in frequency. Stacy Adams

## 2011-03-29 NOTE — Progress Notes (Addendum)
3 Days Post-Op Procedure(s) (LRB): CORONARY ARTERY BYPASS GRAFTING (CABG) (N/A)  Subjective: Patient feeling ok. Passing flatus but no bm yet.  Objective: Vital signs in last 24 hours: Patient Vitals for the past 24 hrs:  BP Temp Temp src Pulse Resp SpO2 Weight  03/29/11 0505 112/62 mmHg 98 F (36.7 C) Oral 97  19  97 % 153 lb 14.1 oz (69.8 kg)  03/28/11 2033 170/75 mmHg 97.9 F (36.6 C) Oral 94  21  98 % -  03/28/11 1600 124/75 mmHg - - 41  22  83 % -  03/28/11 1531 - 99.1 F (37.3 C) Oral - - - -  03/28/11 1500 166/73 mmHg - - 90  21  95 % -  03/28/11 1400 154/81 mmHg - - 91  22  95 % -  03/28/11 1300 146/73 mmHg - - 88  18  94 % -  03/28/11 1247 - 97.8 F (36.6 C) Oral - - - -  03/28/11 1200 114/95 mmHg - - 98  20  94 % -  03/28/11 1100 138/53 mmHg - - 73  16  98 % -  03/28/11 1000 147/55 mmHg - - 78  19  96 % -  03/28/11 0900 143/54 mmHg - - 80  18  94 % -   Pre op weight  65 kg Current Weight  03/29/11 153 lb 14.1 oz (69.8 kg)      Intake/Output from previous day: 12/31 0701 - 01/01 0700 In: 300 [P.O.:150; IV Piggyback:150] Out: 300 [Urine:300]   Physical Exam:  Cardiovascular: RRR, no murmurs, gallops, or rubs. Pulmonary: Slightly decreased at bases bilaterally; no rales, wheezes, or rhonchi. Abdomen: Soft, non tender, bowel sounds present. Extremities: Mild bilateral lower extremity edema. Wounds: Clean and dry.  No erythema or signs of infection.  Lab Results: CBC: Basename 03/29/11 0615 03/28/11 0430  WBC 11.9* 16.6*  HGB 8.7* 9.4*  HCT 26.5* 28.2*  PLT 150 126*   BMET:  Basename 03/29/11 0615 03/28/11 0430  NA 142 139  K 3.5 3.5  CL 105 105  CO2 28 27  GLUCOSE 91 116*  BUN 20 19  CREATININE 0.73 1.01  CALCIUM 8.9 8.4    PT/INR:  Basename 03/26/11 1440  LABPROT 17.0*  INR 1.36   ABG:  INR: Will add last result for INR, ABG once components are confirmed Will add last 4 CBG results once components are confirmed  Assessment/Plan:  1.  CV - Had brief run of questionable SVT (HR 140-160's) around 6:36 pm last evening. SR this am. Continue with Lisinopril 5 daily.Lopressor not on MAR this am so will start.As of 9:45 am, patient apperaed to have brief run of afib. Will increase Lopressor to 25 bid and decrease Lisinopril to 2.5. Continue to mointor. 2.  Pulmonary - Encourage incentive spirometer.Wean O2 as tolerates.CXR this am shows  small b/l pleural effusions (L>R) and bibasilar atx R>L. 3. Volume Overload - Diurese. 4.  Acute blood loss anemia - H/H this am 8.7/26.5.Start Nu Iron and Folic Acid. 5.HGA1C 5.9. CBGS have been less than 120. Stop CBGs and SS.   ZIMMERMAN,DONIELLE M, PA-C 03/29/2011   in and out of a fib, sinus tach when not in a fib, will give IV lopressor, PO lopressor dose increased. If develops sustained a fib will start amiodarone

## 2011-03-29 NOTE — Progress Notes (Signed)
Patient ambulated 434ft with rolling walker on 2L of oxygen. Patient tolerated walk well. Encouraged continued usage of IS and one more walk before end of day. Will continue to monitor.  Stacy Adams

## 2011-03-29 NOTE — Progress Notes (Signed)
1 Day Post-Op  Procedure(s) (LRB):  CORONARY ARTERY BYPASS GRAFTING (CABG) X 4 (LIMA TO LAD,SVG to Diagonal,SVG to distal Circumflex, SVG to PDA  (N/A)   Subjective: No specific complaints.   Had large BM this AM after meds  Objective: Vital signs in last 24 hours: Temp:  [97.8 F (36.6 C)-99.1 F (37.3 C)] 97.8 F (36.6 C) (01/01 1411) Pulse Rate:  [41-97] 64  (01/01 1411) Resp:  [19-22] 19  (01/01 1411) BP: (112-170)/(62-108) 146/108 mmHg (01/01 1411) SpO2:  [83 %-98 %] 98 % (01/01 1411) Weight:  [69.8 kg (153 lb 14.1 oz)] 153 lb 14.1 oz (69.8 kg) (01/01 0505) Weight change: -0.6 kg (-1 lb 5.2 oz) Last BM Date: 03/25/11   PAP 43/26 Intake/Output from previous day: +1160 12/31 0701 - 01/01 0700 In: 300 [P.O.:150; IV Piggyback:150] Out: 300 [Urine:300] Intake/Output this shift: Total I/O In: 320 [P.O.:320] Out: 700 [Urine:700]  PE: General appearance: alert, cooperative, appears stated age, no distress and pale Lungs: clear to auscultation bilaterally Heart: regular rate and rhythm, S1, S2 normal, no S3 or S4, no click and friction rub heard anterior precordium Abdomen: soft, non-tender; bowel sounds normal; no masses,  no organomegaly Neurologic: Grossly normal  Telemetry:  NSR, with intermittent bursts of PAF  Basename 03/29/11 0615 03/28/11 0430  WBC 11.9* 16.6*  HGB 8.7* 9.4*  HCT 26.5* 28.2*  PLT 150 126*   BMET  Basename 03/29/11 0615 03/28/11 0430  NA 142 139  K 3.5 3.5  CL 105 105  CO2 28 27  GLUCOSE 91 116*  BUN 20 19  CREATININE 0.73 1.01  CALCIUM 8.9 8.4   No results found for this basename: TROPONINI:2,CK,MB:2 in the last 72 hours  Lab Results  Component Value Date   CHOL 272* 03/25/2011   HDL 37* 03/25/2011   LDLCALC 196* 03/25/2011   LDLDIRECT 203.5 01/09/2008   TRIG 197* 03/25/2011   CHOLHDL 7.4 03/25/2011   Lab Results  Component Value Date   HGBA1C 5.9* 03/25/2011     Lab Results  Component Value Date   TSH 3.830 03/25/2011      EKG: Reviwed NSR - Inf Q waves  Studies/Results:  CXR reviewed   Medications: I have reviewed the patient's current medications.  Assessment/Plan: Principal Problem:  *NSTEMI (non-ST elevated myocardial infarction) Urgent cath Active Problems:  CAD, multiple vessel  HYPERLIPIDEMIA  HYPERTENSION  ANXIETY  Anemia associated with acute blood loss - post op CABG   POD #2 CABG. Progressing well.  PAF - agree with BB first (not sure why not on MAR) - if persists, agree with short Term Amiodarone as noted by CT Sgx. Blood loss anemia secondary to surgery - started on Iron etc. -- monitor Volume overload - diurese per CT Sgx   Would gradually  increase BB & ACE-I dose (BB first) On statin. Minimal volume up - diurese per CT Sgx. Finally had BM,  Ambulate.   LOS: 4 days   Stacy Adams, M.D., M.S. [facility] [address] [place], [place]  [address]  [phone]  03/29/2011 2:45 PM

## 2011-03-29 NOTE — Progress Notes (Signed)
Patient oxygen sat on room air 82% while ambulating to bathroom.  Reapplied oxygen at 2L and sat immediately returned to 93%.  Encouraged continued usage of IS, will attempt oxygen weaning later. Stacy Adams

## 2011-03-30 ENCOUNTER — Encounter (HOSPITAL_COMMUNITY): Payer: Self-pay | Admitting: Cardiothoracic Surgery

## 2011-03-30 ENCOUNTER — Other Ambulatory Visit: Payer: Self-pay

## 2011-03-30 DIAGNOSIS — Z951 Presence of aortocoronary bypass graft: Secondary | ICD-10-CM

## 2011-03-30 DIAGNOSIS — I472 Ventricular tachycardia: Secondary | ICD-10-CM

## 2011-03-30 MED ORDER — AMIODARONE HCL 200 MG PO TABS
400.0000 mg | ORAL_TABLET | Freq: Two times a day (BID) | ORAL | Status: DC
  Administered 2011-03-30 – 2011-04-02 (×7): 400 mg via ORAL
  Filled 2011-03-30 (×8): qty 2

## 2011-03-30 MED ORDER — FUROSEMIDE 40 MG PO TABS: 40.0000 mg | ORAL_TABLET | Freq: Every day | ORAL | Status: DC

## 2011-03-30 MED ORDER — POTASSIUM CHLORIDE CRYS ER 20 MEQ PO TBCR
40.0000 meq | EXTENDED_RELEASE_TABLET | Freq: Once | ORAL | Status: AC
  Administered 2011-03-30: 40 meq via ORAL
  Filled 2011-03-30: qty 2

## 2011-03-30 MED ORDER — POLYSACCHARIDE IRON 150 MG PO CAPS: 150.0000 mg | ORAL_CAPSULE | Freq: Every day | ORAL | Status: DC

## 2011-03-30 MED ORDER — FOLIC ACID 1 MG PO TABS: 1.0000 mg | ORAL_TABLET | Freq: Every day | ORAL | Status: AC

## 2011-03-30 MED ORDER — ASPIRIN 325 MG PO TBEC: 325.0000 mg | DELAYED_RELEASE_TABLET | Freq: Every day | ORAL | Status: AC

## 2011-03-30 MED ORDER — METOPROLOL TARTRATE 25 MG PO TABS
25.0000 mg | ORAL_TABLET | Freq: Three times a day (TID) | ORAL | Status: DC
  Administered 2011-03-30 – 2011-04-02 (×10): 25 mg via ORAL
  Filled 2011-03-30 (×12): qty 1

## 2011-03-30 MED ORDER — SODIUM CHLORIDE 0.9 % IV SOLN
Freq: Once | INTRAVENOUS | Status: AC
  Administered 2011-03-30: 14:00:00 via INTRAVENOUS

## 2011-03-30 MED ORDER — METOPROLOL TARTRATE 25 MG PO TABS: 37.5000 mg | ORAL_TABLET | Freq: Two times a day (BID) | ORAL | Status: DC

## 2011-03-30 MED ORDER — ROSUVASTATIN CALCIUM 10 MG PO TABS: 10.0000 mg | ORAL_TABLET | Freq: Every day | ORAL | Status: DC

## 2011-03-30 MED ORDER — POTASSIUM CHLORIDE CRYS ER 20 MEQ PO TBCR
20.0000 meq | EXTENDED_RELEASE_TABLET | Freq: Once | ORAL | Status: DC
  Filled 2011-03-30: qty 1

## 2011-03-30 MED ORDER — FUROSEMIDE 40 MG PO TABS
40.0000 mg | ORAL_TABLET | Freq: Once | ORAL | Status: DC
  Filled 2011-03-30: qty 1

## 2011-03-30 MED ORDER — POTASSIUM CHLORIDE CRYS ER 20 MEQ PO TBCR
20.0000 meq | EXTENDED_RELEASE_TABLET | Freq: Every day | ORAL | Status: DC
  Administered 2011-03-31: 20 meq via ORAL
  Filled 2011-03-30 (×2): qty 1

## 2011-03-30 MED ORDER — METOPROLOL TARTRATE 25 MG PO TABS: 25.0000 mg | ORAL_TABLET | Freq: Three times a day (TID) | ORAL | Status: DC

## 2011-03-30 MED ORDER — LISINOPRIL 2.5 MG PO TABS: 2.5000 mg | ORAL_TABLET | Freq: Every day | ORAL | Status: DC

## 2011-03-30 MED ORDER — TRAMADOL HCL 50 MG PO TABS: 50.0000 mg | ORAL_TABLET | ORAL | Status: AC | PRN

## 2011-03-30 MED ORDER — DEXTROSE 5 % IV SOLN
150.0000 mg | Freq: Once | INTRAVENOUS | Status: AC
  Administered 2011-03-30: 150 mg via INTRAVENOUS
  Filled 2011-03-30: qty 3

## 2011-03-30 MED ORDER — POTASSIUM CHLORIDE CRYS ER 20 MEQ PO TBCR: 40.0000 meq | EXTENDED_RELEASE_TABLET | Freq: Every day | ORAL | Status: DC

## 2011-03-30 MED ORDER — POTASSIUM CHLORIDE CRYS ER 20 MEQ PO TBCR: 20.0000 meq | EXTENDED_RELEASE_TABLET | Freq: Every day | ORAL | Status: DC

## 2011-03-30 NOTE — Discharge Instructions (Signed)
Activity: 1.May walk up steps                2.No lifting more than ten pounds for four weeks.                 3.No driving for four weeks.                4.Stop any activity that causes chest pain, shortness of breath, dizziness,sweating or excessive weakness.                5.Avoid straining.                6.Continue with your breathing exercises daily.  Diet: Low fat, Low salt diet  Wound Care: May shower.  Clean wounds with mild soap and water daily. Contact the office at [phone] if any problems arise.  Coronary Artery Bypass Grafting Care After Refer to this sheet in the next few weeks. These instructions provide you with information on caring for yourself after your procedure. Your caregiver may also give you more specific instructions. Your treatment has been planned according to current medical practices, but problems sometimes occur. Call your caregiver if you have any problems or questions after your procedure.  Recovery from open heart surgery will be different for everyone. Some people feel well after 3 or 4 weeks, while for others it takes longer. After heart surgery, it may be normal to:  Not have an appetite, feel nauseated by the smell of food, or only want to eat a small amount.   Be constipated because of changes in your diet, activity, and medicines. Eat foods high in fiber. Add fresh fruits and vegetables to your diet. Stool softeners may be helpful.   Feel sad or unhappy. You may be frustrated or cranky. You may have good days and bad days. Do not give up. Talk to your caregiver if you do not feel better.   Feel weakness and fatigue. You many need physical therapy or cardiac rehabilitation to get your strength back.   Develop an irregular heartbeat called atrial fibrillation. Symptoms of atrial fibrillation are a fast, irregular heartbeat or feelings of fluttery heartbeats, shortness of breath, low blood pressure, and dizziness. If these symptoms develop, see your  caregiver right away.  MEDICATION  Have a list of all the medicines you will be taking when you leave the hospital. For every medicine, know the following:   Name.   Exact dose.   Time of day to be taken.   How often it should be taken.   Why you are taking it.   Ask which medicines should or should not be taken together. If you take more than one heart medicine, ask if it is okay to take them together. Some heart medicines should not be taken at the same time because they may lower your blood pressure too much.   Narcotic pain medicine can cause constipation. Eat fresh fruits and vegetables. Add fiber to your diet. Stool softener medicine may help relieve constipation.   Keep a copy of your medicines with you at all times.   Do not add or stop taking any medicine until you check with your caregiver.   Medicines can have side effects. Call your caregiver who prescribed the medicine if you:   Start throwing up, have diarrhea, or have stomach pain.   Feel dizzy or lightheaded when you stand up.   Feel your heart is skipping beats or is beating too fast or  too slow.   Develop a rash.   Notice unusual bruising or bleeding.  HOME CARE INSTRUCTIONS  After heart surgery, it is important to learn how to take your pulse. Have your caregiver show you how to take your pulse.   Use your incentive spirometer. Ask your caregiver how long after surgery you need to use it.  Care of your chest incision  Tell your caregiver right away if you notice clicking in your chest (sternum).   Support your chest with a pillow or your arms when you take deep breaths and cough.   Follow your caregiver's instructions about when you can bathe or swim.   Protect your incision from sunlight during the first year to keep the scar from getting dark.   Tell your caregiver if you notice:   Increased tenderness of your incision.   Increased redness or swelling around your incision.   Drainage or pus  from your incision.  Care of your leg incision(s)  Avoid crossing your legs.   Avoid sitting for long periods of time. Change positions every half hour.   Elevate your leg(s) when you are sitting.   Check your leg(s) daily for swelling. Check the incisions for redness or drainage.   Wear your elastic stockings as told by your caregiver. Take them off at bedtime.  Diet  Diet is very important to heart health.   Eat plenty of fresh fruits and vegetables. Meats should be lean cut. Avoid canned, processed, and fried foods.   Talk to a dietician. They can teach you how to make healthy food and drink choices.  Weight  Weigh yourself every day. This is important because it helps to know if you are retaining fluid that may make your heart and lungs work harder.   Use the same scale each time.   Weigh yourself every morning at the same time. You should do this after you go to the bathroom, but before you eat breakfast.   Your weight will be more accurate if you do not wear any clothes.   Record your weight.   Tell your caregiver if you have gained 2 pounds or more overnight.  Activity Stop any activity at once if you have chest pain, shortness of breath, irregular heartbeats, or dizziness. Get help right away if you have any of these symptoms.  Bathing.  Avoid soaking in a bath or hot tub until your incisions are healed.   Rest. You need a balance of rest and activity.   Exercise. Exercise per your caregiver's advice. You may need physical therapy or cardiac rehabilitation to help strengthen your muscles and build your endurance.   Climbing stairs. Unless your caregiver tells you not to climb stairs, go up stairs slowly and rest if you tire. Do not pull yourself up by the handrail.   Driving a car. Follow your caregiver's advice on when you may drive. You may ride as a passenger at any time. When traveling for long periods of time in a car, get out of the car and walk around for a  few minutes every 2 hours.   Lifting. Avoid lifting, pushing, or pulling anything heavier than 10 pounds for 6 weeks after surgery or as told by your caregiver.   Returning to work. Check with your caregiver. People heal at different rates. Most people will be able to go back to work 6 to 12 weeks after surgery.   Sexual activity. You may resume sexual relations as told by your caregiver.  SEEK MEDICAL CARE IF:  Any of your incisions are red, painful, or have any type of drainage coming from them.   You have an oral temperature above 102 F (38.9 C).   You have ankle or leg swelling.   You have pain in your legs.   You have weight gain of 2 or more pounds a day.   You feel dizzy or lightheaded when you stand up.  SEEK IMMEDIATE MEDICAL CARE IF:  You have angina or chest pain that goes to your jaw or arms. Call your local emergency services right away.   You have shortness of breath at rest or with activity.   You have a fast or irregular heartbeat (arrhythmia).   There is a "clicking" in your sternum when you move.   You have numbness or weakness in your arms or legs.  MAKE SURE YOU:  Understand these instructions.   Will watch your condition.   Will get help right away if you are not doing well or get worse.  Document Released: 10/01/2004 Document Revised: 11/24/2010 Document Reviewed: 05/19/2010 [facility] Patient Information 2012 [place], [place].

## 2011-03-30 NOTE — Progress Notes (Signed)
[facility]  Subjective: PT doing well.  Ambulating well.  Objective: Vital signs in last 24 hours: Temp:  [97.8 F (36.6 C)-98.8 F (37.1 C)] 98.2 F (36.8 C) (01/02 0538) Pulse Rate:  [64-97] 84  (01/02 0538) Resp:  [18-20] 18  (01/02 0538) BP: (108-146)/(65-108) 146/65 mmHg (01/02 0538) SpO2:  [94 %-98 %] 94 % (01/02 0538) Last BM Date: 03/29/11  Intake/Output from previous day: 01/01 0701 - 01/02 0700 In: 320 [P.O.:320] Out: 700 [Urine:700] Intake/Output this shift:    Medications Current Facility-Administered Medications  Medication Dose Route Frequency Provider Last Rate Last Dose  . acetaminophen (TYLENOL) tablet 650 mg  650 mg Oral Q6H PRN Delight Ovens, MD      . aspirin EC tablet 325 mg  325 mg Oral Daily Delight Ovens, MD   325 mg at 03/29/11 0951  . bisacodyl (DULCOLAX) EC tablet 10 mg  10 mg Oral Daily PRN Delight Ovens, MD   10 mg at 03/28/11 1610   Or  . bisacodyl (DULCOLAX) suppository 10 mg  10 mg Rectal Daily PRN Delight Ovens, MD      . docusate sodium (COLACE) capsule 200 mg  200 mg Oral Daily Delight Ovens, MD   200 mg at 03/29/11 0951  . enoxaparin (LOVENOX) injection 30 mg  30 mg Subcutaneous Q24H Delight Ovens, MD   30 mg at 03/29/11 1957  . folic acid (FOLVITE) tablet 1 mg  1 mg Oral Daily Stacy Balls, Stacy Adams   1 mg at 03/29/11 0951  . furosemide (LASIX) tablet 40 mg  40 mg Oral Daily Stacy Balls, Stacy Adams   40 mg at 03/29/11 0951  . furosemide (LASIX) tablet 40 mg  40 mg Oral Once Stacy Balls, Stacy Adams      . lactulose (CHRONULAC) 10 GM/15ML solution 20 g  20 g Oral Once Stacy Balls, Stacy Adams   20 g at 03/29/11 0951  . lisinopril (PRINIVIL,ZESTRIL) tablet 2.5 mg  2.5 mg Oral Daily Stacy Balls, Stacy Adams   2.5 mg at 03/29/11 0952  . metoprolol (LOPRESSOR) injection 2.5 mg  2.5 mg Intravenous Q15 min PRN Stacy Slot, MD   2.5 mg at 03/29/11 1011  . metoprolol tartrate  (LOPRESSOR) tablet 25 mg  25 mg Oral BID Stacy Balls, Stacy Adams   25 mg at 03/29/11 2110  . ondansetron (ZOFRAN) tablet 4 mg  4 mg Oral Q6H PRN Delight Ovens, MD       Or  . ondansetron [facility]) injection 4 mg  4 mg Intravenous Q6H PRN Delight Ovens, MD   4 mg at 03/28/11 1649  . pantoprazole (PROTONIX) EC tablet 40 mg  40 mg Oral QAC breakfast Delight Ovens, MD   40 mg at 03/29/11 9604  . polysaccharide iron (NIFEREX) capsule 150 mg  150 mg Oral Daily Stacy Balls, Stacy Adams   150 mg at 03/29/11 0951  . potassium chloride SA (K-DUR,KLOR-CON) CR tablet 20 mEq  20 mEq Oral Once Stacy Balls, Stacy Adams      . potassium chloride SA (K-DUR,KLOR-CON) CR tablet 20 mEq  20 mEq Oral Daily Stacy Margaretann Loveless, Stacy Adams      . potassium chloride SA (K-DUR,KLOR-CON) CR tablet 40 mEq  40 mEq Oral Once Stacy Balls, Stacy Adams      . povidone-iodine (BETADINE) 10 % external solution 1 application  1 application Topical BID Delight Ovens, MD   1  application at 04/18/2011 2111  . rosuvastatin (CRESTOR) tablet 10 mg  10 mg Oral QHS Delight Ovens, MD   10 mg at 04-18-11 2110  . sodium chloride 0.9 % injection 3 mL  3 mL Intravenous Q12H Delight Ovens, MD   3 mL at 04/18/2011 2110  . sodium chloride 0.9 % injection 3 mL  3 mL Intravenous PRN Delight Ovens, MD      . traMADol Janean Sark) tablet 50-100 mg  50-100 mg Oral Q4H PRN Delight Ovens, MD      . DISCONTD: insulin aspart (novoLOG) injection 0-24 Units  0-24 Units Subcutaneous TID AC & HS Delight Ovens, MD   2 Units at 03/28/11 2151  . DISCONTD: lisinopril (PRINIVIL,ZESTRIL) tablet 5 mg  5 mg Oral Daily Marykay Lex   5 mg at 03/28/11 2952  . DISCONTD: metoprolol tartrate (LOPRESSOR) tablet 12.5 mg  12.5 mg Oral BID Stacy Balls, Stacy Adams      . DISCONTD: potassium chloride (KLOR-CON) packet 20 mEq  20 mEq Oral Daily Stacy Balls, Stacy Adams      . DISCONTD: potassium chloride SA (K-DUR,KLOR-CON) CR tablet 20 mEq  20 mEq Oral  Daily Stacy Balls, Stacy Adams   20 mEq at 04/18/2011 1743  . DISCONTD: potassium chloride SA (K-DUR,KLOR-CON) CR tablet 40 mEq  40 mEq Oral Daily Stacy Balls, Stacy Adams      . DISCONTD: traMADol Janean Sark) tablet 50 mg  50 mg Oral Q6H PRN Delight Ovens, MD   50 mg at 03/28/11 0947    PE: General appearance: alert, cooperative and no distress Lungs: Decreased BS right base Heart: regular rate and rhythm, S1, S2 normal, no murmur, click, rub or gallop Extremities: 1+ LEE Pulses: 1+ DP pulses.  Lab Results:   Basename Apr 18, 2011 0615 03/28/11 0430 03/27/11 1530 03/27/11 1500  WBC 11.9* 16.6* -- 16.7*  HGB 8.7* 9.4* 9.9* --  HCT 26.5* 28.2* 29.0* --  PLT 150 126* -- 129*   BMET  Basename 04-18-11 0615 03/28/11 0430 03/27/11 1530  NA 142 139 142  K 3.5 3.5 3.8  CL 105 105 105  CO2 28 27 --  GLUCOSE 91 116* 124*  BUN 20 19 15   CREATININE 0.73 1.01 1.10  CALCIUM 8.9 8.4 --   Studies/Results: CHEST - 2 VIEW, 18-Apr-2011  Comparison: 03/28/2011, 03/27/2011  Findings: There are changes of median sternotomy for CABG. The  left-sided chest tube and right IJ Swan-Ganz catheter have been  removed. No visible pneumothorax. Small bilateral pleural  effusions and bibasilar atelectasis are present. Negative for  edema. Epicardial pacing leads noted. No acute bony abnormality.  IMPRESSION:  Small bilateral pleural effusions and bibasilar atelectasis. Recent  CABG.  Assessment/Plan  Principal Problem:  *S/P CABG x 4: CORONARY ARTERY BYPASS GRAFTING (CABG) X 4 (LIMA TO LAD,SVG to Diagonal,SVG to distal Circumflex, SVG to PDA Active Problems:  HYPERLIPIDEMIA  ANXIETY  HYPERTENSION  NSTEMI (non-ST elevated myocardial infarction) Urgent cath  CAD, multiple vessel  Anemia associated with acute blood loss - post op CABG  NSVT (nonsustained ventricular tachycardia)  PAF  Plan:  Short runs SVT this AM. Lopressor increased per CTS to 25mg  TID.  HGB decreased to 8.7.   LOS: 5 days     Stacy Adams,Stacy Adams 03/30/2011 8:23 AM  Agree with note written by Stacy Adams PAC  POD#4 CABGx4. Exam benign.Mild peripheral edema. Labs OK. Ambulating. SVT. Meds adjusted. Cont current treatment per TCTS. F/U with Dr. Herbie Baltimore as OP.  Runell Gess 03/30/2011 9:31 AM

## 2011-03-30 NOTE — Progress Notes (Signed)
Patient had short burst of SVT hr 150s. Patient asymptomatic and was ambulating back from bathroom.  Returned back to SR 80s after one minute. Nickolas Madrid

## 2011-03-30 NOTE — Progress Notes (Signed)
Pt refused to walk tonight HR is irregular,with burst of SVT at 150s.

## 2011-03-30 NOTE — Progress Notes (Addendum)
4 Days Post-Op Procedure(s) (LRB): CORONARY ARTERY BYPASS GRAFTING (CABG) (N/A)  Subjective: Patient feeling ok.  Objective: Vital signs in last 24 hours: Patient Vitals for the past 24 hrs:  BP Temp Temp src Pulse Resp SpO2  03/30/11 0538 146/65 mmHg 98.2 F (36.8 C) Oral 84  18  94 %  03/29/11 2108 108/90 mmHg 98.8 F (37.1 C) Oral 97  20  94 %  03/29/11 1534 135/70 mmHg - - - - -  03/29/11 1411 146/108 mmHg 97.8 F (36.6 C) Oral 64  19  98 %  03/29/11 0955 114/65 mmHg - - - - -   Pre op weight  65 kg Current Weight  03/29/11 153 lb 14.1 oz (69.8 kg)      Intake/Output from previous day: 01/01 0701 - 01/02 0700 In: 320 [P.O.:320] Out: 700 [Urine:700]   Physical Exam:  Cardiovascular: RRR, no murmurs, gallops, or rubs. Pulmonary: Slightly decreased at bases bilaterally; no rales, wheezes, or rhonchi. Abdomen: Soft, non tender, bowel sounds present. Extremities: Mild bilateral lower extremity edema. Wounds: Clean and dry.  No erythema or signs of infection.  Lab Results: CBC:  Basename 03/29/11 0615 03/28/11 0430  WBC 11.9* 16.6*  HGB 8.7* 9.4*  HCT 26.5* 28.2*  PLT 150 126*   BMET:   Basename 03/29/11 0615 03/28/11 0430  NA 142 139  K 3.5 3.5  CL 105 105  CO2 28 27  GLUCOSE 91 116*  BUN 20 19  CREATININE 0.73 1.01  CALCIUM 8.9 8.4    PT/INR: No results found for this basename: LABPROT,INR in the last 72 hours ABG:  INR: Will add last result for INR, ABG once components are confirmed Will add last 4 CBG results once components are confirmed  Assessment/Plan:  1. CV - Had a couple of brief runs ofSVT (HR 140-160's) 12/31 and 1/1 . Continue with Lisinopril 2.5 daily.Will increase Lopressor to 25 tid. 2.  Pulmonary - Encourage incentive spirometer.Wean O2 as tolerates. 3. Volume Overload - Continue with diuresis.Will give additional dose of lasix today. 4.  Acute blood loss anemia - H/H this am 8.7/26.5.Continue with Nu Iron and Folic  Acid. 5.Continue with CRPI 6.Possibly discharge in 1-2 days if off O2.    ZIMMERMAN,DONIELLE M, PA-C 03/30/2011   Now in sinus has had some afib Home poss 2-3 days if rhythm remains in sinus  I have seen and examined Evelene Croon and agree with the above assessment  and plan.  Delight Ovens MD Beeper [phone] Office [phone] 03/30/2011 5:28 PM

## 2011-03-30 NOTE — Progress Notes (Signed)
   CARE MANAGEMENT NOTE 03/30/2011  Patient:  Stacy Adams, Stacy Adams   Account Number:  [id number]  Date Initiated:  03/30/2011  Documentation initiated by:  Alishba Naples  Subjective/Objective Assessment:   PT S/P CABG ON 03/26/11.  PTA, PT INDEPENDENT, LIVES WITH SPOUSE.     Action/Plan:   MET WITH PT AND HUSBAND TO DISCUSS DC PLANS.  SPOUSE TO PROVIDE 24HR CARE AT DISCHARGE.   Anticipated DC Date:  03/31/2011   Anticipated DC Plan:  HOME/SELF CARE         Choice offered to / List presented to:     DME arranged  [facility]  BEDSIDE [facility]      DME agency  [facility]        Status of service:   Medicare Important Message given?   (If response is "NO", the following Medicare IM given date fields will be blank) Date Medicare IM given:   Date Additional Medicare IM given:    Discharge Disposition:    Per UR Regulation:    Comments:  03/30/11 Kohl Polinsky,RN,BSN 1120 PT REQUESTS RW AND BSC FOR HOME.  REFERRAL TO JUSTIN WITH [facility] FOR DME NEEDS.  DME TO BE DELIVERED TO PT'S ROOM PRIOR TO DC HOME. Phone #[phone]

## 2011-03-30 NOTE — Progress Notes (Signed)
Patient converted back to NSR hr 80 after completion of IV Amiodarone bolus of 150mg .  Patient bp 99/57. Will continue to monitor. Stacy Adams

## 2011-03-30 NOTE — Progress Notes (Signed)
CARDIAC REHAB PHASE I   PRE:  Rate/Rhythm: 82SR  BP:  Supine:   Sitting: 150/56  Standing:    SaO2: 100%2L  MODE:  Ambulation: 550 ft   POST:  Rate/Rhythem: 116ST  BP:  Supine:   Sitting: 135/51  Standing:    SaO2: 93-94%RA hallway and 91%RA room  0820-0840 Pt walked 550 ft on RA with rolling walker and asst x 1. Gait steady. Checked sats on RA several times during walk. Remained above 90%RA. Left off oxygen and notified RN. Encouraged I.S. To chair after walk. Tolerated well.  Duanne Limerick

## 2011-03-30 NOTE — Progress Notes (Signed)
Began ambulating patient in hallway after walking about 64ft patient stated she felt very weak and was seeing spot and became pale.  Called by monitor tech at same time and HR was 170s Afib.  Pushed patient back to room and bp 70/45.  PA notified and orders received for 150mg  IV Amiodarone bolus and bolus of normal saline.  Repeat bp after sitting, 77/54. Will continue to monitor patient closely.  Stacy Adams

## 2011-03-31 LAB — BASIC METABOLIC PANEL
Chloride: 101 mEq/L (ref 96–112)
Creatinine, Ser: 0.86 mg/dL (ref 0.50–1.10)
GFR calc Af Amer: 76 mL/min — ABNORMAL LOW (ref >90)
Sodium: 141 mEq/L (ref 135–145)

## 2011-03-31 LAB — GLUCOSE, CAPILLARY: Glucose-Capillary: 88 mg/dL (ref 70–99)

## 2011-03-31 MED ORDER — METOPROLOL TARTRATE 25 MG PO TABS: 25.0000 mg | ORAL_TABLET | Freq: Three times a day (TID) | ORAL | Status: DC

## 2011-03-31 MED ORDER — FUROSEMIDE 10 MG/ML IJ SOLN
40.0000 mg | Freq: Once | INTRAMUSCULAR | Status: AC
  Administered 2011-03-31: 40 mg via INTRAVENOUS
  Filled 2011-03-31: qty 4

## 2011-03-31 MED ORDER — POTASSIUM CHLORIDE CRYS ER 20 MEQ PO TBCR
30.0000 meq | EXTENDED_RELEASE_TABLET | Freq: Once | ORAL | Status: AC
  Administered 2011-03-31: 30 meq via ORAL
  Filled 2011-03-31: qty 1

## 2011-03-31 MED ORDER — AMIODARONE HCL 400 MG PO TABS: 400.0000 mg | ORAL_TABLET | Freq: Two times a day (BID) | ORAL | Status: DC

## 2011-03-31 MED ORDER — FUROSEMIDE 40 MG PO TABS
40.0000 mg | ORAL_TABLET | Freq: Every day | ORAL | Status: DC
  Administered 2011-04-01: 40 mg via ORAL
  Filled 2011-03-31 (×2): qty 1

## 2011-03-31 MED ORDER — LACTULOSE 10 GM/15ML PO SOLN
10.0000 g | Freq: Once | ORAL | Status: DC
  Filled 2011-03-31: qty 15

## 2011-03-31 NOTE — Progress Notes (Signed)
UR Completed.  Stacy Adams [phone] 03/31/2011  

## 2011-03-31 NOTE — Progress Notes (Signed)
CARDIAC REHAB PHASE I   PRE:  Rate/Rhythm: 74SR  BP:  Supine:   Sitting: 154/59  Standing:    SaO2: 100% 3L  MODE:  Ambulation: 550 ft   POST:  Rate/Rhythem: 85SR  BP:  Supine:   Sitting: 134/59  Standing:    SaO2: 94%RA in hall and room 1000-1025 Pt walked 550 ft on RA with her rolling walker. Tolerated well. Remained in NSR. To chair after walk. Left off oxygen. Encouraged IS. Permission given to refer to [facility] Phase 2.  Duanne Limerick

## 2011-03-31 NOTE — Discharge Summary (Signed)
Physician Discharge Summary  Patient ID: Stacy Adams MRN: [id number] DOB/AGE: October 21, 1940 71 y.o.  Admit date: 03/25/2011 Discharge date: 03/31/2011  Admission Diagnoses: 1.NSTEMI 2.Multivessel CAD 3.History of hyperlipidemia 4.History of hypertension 5.History of anxiety 6.History of oseopenia  Discharge Diagnoses:  1.NSTEMI 2.Multivessel CAD 3.History of hyperlipidemia 4.History of hypertension 5.History of anxiety 6.History of oseopenia 7. ABL Anemia  8.NSVT (nonsustained ventricular tachycardia) 9.Post op afib with RVR (conversion to SR)   Procedure (s):  1.Cardiac catheterization done by Dr. Herbie Baltimore on 03/25/2011 2. Intra Op TEE by Dr. Noreene Larsson 03/26/2011. 3.Coronary artery bypass grafting x4  (left  internal mammary to the left anterior descending coronary artery,  reverse saphenous vein graft to the diagonal coronary artery, reverse  saphenous vein graft to the distal circumflex coronary artery, reverse  saphenous vein graft to the distal right coronary artery) with bilateral  thigh endo-vein harvesting by Dr. Tyrone Sage on 03/26/2011.   History of Presenting Illness: This is a 71 year old WF who developed chest pain around 1am. The morning of 03/25/2011 she initially went to [facility]. After waiting several hours, she started have chest pain again.  EMS was called. Associated symptoms were mild SOB and mild diaphoresis, but no nausea. She also stated she has had to decrease her walking due to chest tightness over the last couple of months. She thought it was related to the cold. An  EKG was done which showed ST depression in leads V2, V3, V4, V5, V6, I, II. She was brought emergently to cath lab for emergent cardiac cath. Initial cardiac enzymes showed a Troponin I 2.04 and a CKMB 8.1.She did rule in for a NSTEMI. Her chest pain apparently resolved on arrival to cardiac catheterization. A cardiothoracic consult was obtained with Dr. Tyrone Sage for consideration of  coronary bypass grafting surgery. Potential risks, complications, and benefits of the surgery discussed with the patient and she agreed to proceed with surgery. It should be noted that preoperative carotid duplex carotid ultrasound showed no evidence of significant internal carotid artery stenosis bilaterally. She then underwent CABG x4 on 03/26/2011.  Brief Hospital Course:  The patient was extubated successfully early the afternoon of postoperative day 1. The patient remained afebrile and hemodynamically stable. Her Swan-Ganz, A-line, chest tubes, and Foley were all removed early in her postoperative course. She was started on a low-dose beta blocker which was titrated accordingly. She was found to be volume overloaded and diuresed accordingly. She also had acute blood loss anemia postoperatively but did not require a postoperative transfusion. Her last H&H was 8.7 /26.5. She was started on Nu-Iron and folic acid. She was felt surgically stable for transfer from the intensive care to [facility] for further convalescence on 03/28/2011. She did have a  Couple of brief runs of an as NSVT.Marland Kitchen She then later had a brief run of A fib and then went into A fib with RVR. She also became hypotensive. Given a bolus of normal saline in an amiodarone bolus. She converted to sinus rhythm and had improvement in her blood pressure. His are then tolerating a diet has had a bowel movement. Her limb postop day #5 she remains afebrile, hemodynamically stable, and in sinus rhythm. He is in the process of being weaned off oxygen. Provided she remains afebrile, hemodynamically stable, and pending morning round evaluation, she'll be surgically stable for discharge on 04/01/2011. Prior to her discharge, however, her epicardial pacing wires and chest tube sutures will be removed.  Filed Vitals:   03/30/11 2100  BP: 117/68  Pulse: 76  Temp: 98 F (36.7 C)  Resp: 18     Latest Vital Signs: Blood pressure 117/68, pulse 76, temperature  98 F (36.7 C), temperature source Oral, resp. rate 18, height 5' (1.524 m), weight 153 lb 14.1 oz (69.8 kg), SpO2 92.00%.  Physical Exam: Cardiovascular: RRR, no murmurs, gallops, or rubs.  Pulmonary: Decreased at bases bilaterally; no rales, wheezes, or rhonchi.  Abdomen: Soft, non tender, bowel sounds present.  Extremities: Mild bilateral lower extremity edema and ecchymosis.  Wounds: Clean and dry. No erythema or signs of infection.   Discharge Condition:Stable.  Recent laboratory studies:  Lab Results  Component Value Date   WBC 11.9* 03/29/2011   HGB 8.7* 03/29/2011   HCT 26.5* 03/29/2011   MCV 87.7 03/29/2011   PLT 150 03/29/2011   Lab Results  Component Value Date   NA 141 03/31/2011   K 3.7 03/31/2011   CL 101 03/31/2011   CO2 28 03/31/2011   CREATININE 0.86 03/31/2011   GLUCOSE 101 03/31/2011     Diagnostic Studies: Dg Chest 2 View  03/29/2011  *RADIOLOGY REPORT*  Clinical Data: Follow-up CABG  CHEST - 2 VIEW  Comparison: 03/28/2011, 03/27/2011  Findings: There are changes of median sternotomy for CABG.  The left-sided chest tube and right IJ Swan-Ganz catheter have been removed.  No visible pneumothorax.  Small bilateral pleural effusions and bibasilar atelectasis are present.  Negative for edema.  Epicardial pacing leads noted.  No acute bony abnormality.  IMPRESSION:  Small bilateral pleural effusions and bibasilar atelectasis. Recent CABG.  Original Report Authenticated By: Britta Mccreedy, M.D.    Discharge Orders    Future Appointments: Provider: Department: Dept Phone: Center:   04/21/2011 2:45 PM Delight Ovens, MD [facility] [phone] [facility]      Discharge Medications: Current Discharge Medication List    START taking these medications   Details  amiodarone (PACERONE) 400 MG tablet Take 1 tablet (400 mg total) by mouth 2 (two) times daily. For one week.Patient then to take Amiodarone 400 mg po daily thereafter. Qty: 60 tablet, Refills: 1    folic acid (FOLVITE) 1 MG  tablet Take 1 tablet (1 mg total) by mouth daily. For one month then stop. Qty: 30 tablet, Refills: 0    furosemide (LASIX) 40 MG tablet Take 1 tablet (40 mg total) by mouth daily. For 5 days then stop. Qty: 5 tablet, Refills: 0    lisinopril (PRINIVIL,ZESTRIL) 2.5 MG tablet Take 1 tablet (2.5 mg total) by mouth daily. Qty: 30 tablet, Refills: 1    metoprolol tartrate (LOPRESSOR) 25 MG tablet Take 1 tablet (25 mg total) by mouth 3 (three) times daily. Qty: 90 tablet, Refills: 1    polysaccharide iron (NIFEREX) 150 MG CAPS capsule Take 1 capsule (150 mg total) by mouth daily. For one month then stop. Qty: 30 each, Refills: 0    potassium chloride SA (K-DUR,KLOR-CON) 20 MEQ tablet Take 1 tablet (20 mEq total) by mouth daily. For 5 days then stop. Qty: 5 tablet, Refills: 0    rosuvastatin (CRESTOR) 10 MG tablet Take 1 tablet (10 mg total) by mouth at bedtime. Qty: 30 tablet, Refills: 1    traMADol (ULTRAM) 50 MG tablet Take 1-2 tablets (50-100 mg total) by mouth every 4 (four) hours as needed for pain. Maximum dose= 8 tablets per day Qty: 45 tablet, Refills: 0      CONTINUE these medications which have CHANGED   Details  aspirin EC 325  MG EC tablet Take 1 tablet (325 mg total) by mouth daily. Qty: 30 tablet      CONTINUE these medications which have NOT CHANGED   Details  OVER THE COUNTER MEDICATION Take 1 tablet by mouth daily. "Source of Life" multivitamin     vitamin C (ASCORBIC ACID) 500 MG tablet Take 500 mg by mouth daily.          Follow Up Appointments: Follow-up Information    Follow up with GERHARDT,EDWARD B, MD. (PA/LAT CXR to be taken on 04/21/2011 at 2:00 pm;Appointment with Dr. Tyrone Sage is on 04/21/2011 at 2:45 pm )    Contact information:   [address] [place] [address] [phone]       Follow up with HARDING,DAVID W. (Call for an appointment for 2 weeks)    Contact information:   [facility] [address] [place] [address] [phone]    Call for a follow up with Dr. Lovell Sheehan regarding further surveillance of HGA1C 5.9      Signed: ZIMMERMAN,DONIELLE M PA-C 03/31/2011, 11:09 AM

## 2011-03-31 NOTE — Progress Notes (Addendum)
5 Days Post-Op Procedure(s) (LRB): CORONARY ARTERY BYPASS GRAFTING (CABG) (N/A)  Subjective: Patient with constipation but no other complaints this am.  Objective: Vital signs in last 24 hours: Patient Vitals for the past 24 hrs:  BP Temp Temp src Pulse Resp SpO2  03/30/11 2100 117/68 mmHg 98 F (36.7 C) Oral 76  18  92 %  03/30/11 1706 120/68 mmHg - - 88  - -  03/30/11 1409 108/53 mmHg - - 78  - -  03/30/11 1405 99/57 mmHg - - 80  - -  03/30/11 1352 77/54 mmHg - - 149  - -  03/30/11 1345 77/46 mmHg - - 154  - -  03/30/11 1330 70/45 mmHg - - 160  - -   Pre op weight  65 kg Current Weight  03/29/11 153 lb 14.1 oz (69.8 kg)         Physical Exam:  Cardiovascular: RRR, no murmurs, gallops, or rubs. Pulmonary: Decreased at bases bilaterally; no rales, wheezes, or rhonchi. Abdomen: Soft, non tender, bowel sounds present. Extremities: Mild bilateral lower extremity edema and ecchymosis. Wounds: Clean and dry.  No erythema or signs of infection.  Lab Results: CBC:  Basename 03/29/11 0615  WBC 11.9*  HGB 8.7*  HCT 26.5*  PLT 150   BMET:   Basename 03/29/11 0615  NA 142  K 3.5  CL 105  CO2 28  GLUCOSE 91  BUN 20  CREATININE 0.73  CALCIUM 8.9    PT/INR: No results found for this basename: LABPROT,INR in the last 72 hours ABG:  INR: Will add last result for INR, ABG once components are confirmed Will add last 4 CBG results once components are confirmed  Assessment/Plan:  1. CV - Had a couple of brief runs ofSVT (HR 140-160's) 12/31 and 1/1 . Went into afib with RVR (hypotensive as well) yesterday.She was given an Amiodarone (and normal saline bolus for hypotension) and converted to SR.Maintaining SR on Lopressor 25 tid, Amiodarone 400 bid, and Lisinopril 2.5 daily. 2.  Pulmonary - Encourage incentive spirometer.Wean O2 as tolerates. 3. Volume Overload - Continue with diuresis.Will give Lasix IV this am. 4.  Acute blood loss anemia - H/H this am  8.7/26.5.Continue with Nu Iron and Folic Acid. 5.Continue with CRPI. 6.BMET results available as of 11:45 am. Will supplement potassium as is 3.7.   Ardelle Balls, PA-C 03/31/2011   Improving holding sinus, poss home  Friday I have seen and examined Evelene Croon and agree with the above assessment  and plan.  Delight Ovens MD Beeper [phone] Office [phone] 03/31/2011 8:50 AM

## 2011-03-31 NOTE — Progress Notes (Signed)
[facility]  Subjective: Feels better.   Sustained post-op Afib resolved after Amiodarone.  BP stable.  Objective: Vital signs in last 24 hours: Temp:  [98 F (36.7 C)] 98 F (36.7 C) (01/02 2100) Pulse Rate:  [76-160] 76  (01/02 2100) Resp:  [18] 18  (01/02 2100) BP: (70-120)/(45-68) 117/68 mmHg (01/02 2100) SpO2:  [92 %] 92 % (01/02 2100) Last BM Date: 03/30/11  Intake/Output from previous day: Not accurately reported (vs. Not collected)     Medications Current Facility-Administered Medications  Medication Dose Route Frequency Provider Last Rate Last Dose  . 0.9 %  sodium chloride infusion   Intravenous Once Rowe Clack, PA 250 mL/hr at 03/30/11 1347    . acetaminophen (TYLENOL) tablet 650 mg  650 mg Oral Q6H PRN Delight Ovens, MD      . amiodarone (CORDARONE) 150 mg in dextrose 5 % 100 mL bolus  150 mg Intravenous Once Rowe Clack, PA   150 mg at 03/30/11 1348  . amiodarone (PACERONE) tablet 400 mg  400 mg Oral BID Rowe Clack, PA   400 mg at 03/30/11 2124  . aspirin EC tablet 325 mg  325 mg Oral Daily Delight Ovens, MD   325 mg at 03/30/11 0948  . bisacodyl (DULCOLAX) EC tablet 10 mg  10 mg Oral Daily PRN Delight Ovens, MD   10 mg at 03/28/11 1610   Or  . bisacodyl (DULCOLAX) suppository 10 mg  10 mg Rectal Daily PRN Delight Ovens, MD      . docusate sodium (COLACE) capsule 200 mg  200 mg Oral Daily Delight Ovens, MD   200 mg at 03/29/11 0951  . enoxaparin (LOVENOX) injection 30 mg  30 mg Subcutaneous Q24H Delight Ovens, MD   30 mg at 03/30/11 2015  . folic acid (FOLVITE) tablet 1 mg  1 mg Oral Daily Ardelle Balls, PA   1 mg at 03/30/11 0948  . furosemide (LASIX) injection 40 mg  40 mg Intravenous Once Ardelle Balls, PA      . furosemide (LASIX) tablet 40 mg  40 mg Oral Daily Donielle Margaretann Loveless, PA      . lactulose (CHRONULAC) 10 GM/15ML solution 10 g  10 g Oral Once Ardelle Balls, PA      .  lisinopril (PRINIVIL,ZESTRIL) tablet 2.5 mg  2.5 mg Oral Daily Ardelle Balls, PA   2.5 mg at 03/30/11 0948  . metoprolol (LOPRESSOR) injection 2.5 mg  2.5 mg Intravenous Q15 min PRN Loreli Slot, MD   2.5 mg at 03/29/11 1011  . metoprolol tartrate (LOPRESSOR) tablet 25 mg  25 mg Oral TID Ardelle Balls, PA   25 mg at 03/30/11 2124  . ondansetron (ZOFRAN) tablet 4 mg  4 mg Oral Q6H PRN Delight Ovens, MD       Or  . ondansetron [facility]) injection 4 mg  4 mg Intravenous Q6H PRN Delight Ovens, MD   4 mg at 03/28/11 1649  . pantoprazole (PROTONIX) EC tablet 40 mg  40 mg Oral QAC breakfast Delight Ovens, MD   40 mg at 03/30/11 0949  . polysaccharide iron (NIFEREX) capsule 150 mg  150 mg Oral Daily Ardelle Balls, PA   150 mg at 03/30/11 0948  . potassium chloride SA (K-DUR,KLOR-CON) CR tablet 20 mEq  20 mEq Oral Once Ardelle Balls, PA      . potassium  chloride SA (K-DUR,KLOR-CON) CR tablet 20 mEq  20 mEq Oral Daily Donielle Margaretann Loveless, PA      . potassium chloride SA (K-DUR,KLOR-CON) CR tablet 40 mEq  40 mEq Oral Once Ardelle Balls, PA   40 mEq at 03/30/11 0948  . povidone-iodine (BETADINE) 10 % external solution 1 application  1 application Topical BID Delight Ovens, MD   1 application at 03/30/11 2124  . rosuvastatin (CRESTOR) tablet 10 mg  10 mg Oral QHS Delight Ovens, MD   10 mg at 03/30/11 2124  . sodium chloride 0.9 % injection 3 mL  3 mL Intravenous Q12H Delight Ovens, MD   3 mL at 03/30/11 2124  . sodium chloride 0.9 % injection 3 mL  3 mL Intravenous PRN Delight Ovens, MD      . traMADol Janean Sark) tablet 50-100 mg  50-100 mg Oral Q4H PRN Delight Ovens, MD      . DISCONTD: furosemide (LASIX) tablet 40 mg  40 mg Oral Daily Ardelle Balls, PA   40 mg at 03/30/11 0948  . DISCONTD: furosemide (LASIX) tablet 40 mg  40 mg Oral Once Ardelle Balls, PA        PE: General appearance: alert, cooperative and no  distress Lungs: clear to auscultation bilaterally, non-labored Heart: regular rate and rhythm, S1, S2 normal, no murmur, click, rub or gallop Extremities: 1+ LEE Pulses: 2+ right DP, 1+ Left DP. LEE cool.  Lab Results:   [facility] 03/29/11 0615  WBC 11.9*  HGB 8.7*  HCT 26.5*  PLT 150   BMET  Basename 03/29/11 0615  NA 142  K 3.5  CL 105  CO2 28  GLUCOSE 91  BUN 20  CREATININE 0.73  CALCIUM 8.9    Assessment/Plan  Principal Problem:  *S/P CABG x 4: CORONARY ARTERY BYPASS GRAFTING (CABG) X 4 (LIMA TO LAD,SVG to Diagonal,SVG to distal Circumflex, SVG to  PDA Active Problems:  CAD, multiple vessel  NSTEMI (non-ST elevated myocardial infarction) Urgent cath  AFib with RVR (post-op) - on Amiodarone  HYPERLIPIDEMIA   HYPERTENSION   ANXIETY   Anemia associated with acute blood loss - post op CABG  NSVT (nonsustained ventricular tachycardia - resolved)  Plan: POD #5 CABG x4,  Episode of Afib/RVR yesterday converted with Amiodarone bolus.  Currently in NSR.  PO amio at 400mg  BID.  BP stable.  Continue to monitor for Afib.  No anticoagulation currently warranted given post-op Afib. BP stable now that Afib-RVR resolved.   LOS: 6 days    HAGER,BRYAN W 03/31/2011 9:23 AM  I have seen and examined the patient along with Wilburt Finlay, PA.  I have reviewed the chart, notes and new data.  I agree with Bryan's note.  Key new complaints: Feels tired & weak, but no CP or SOB.  A bit of swelling Key examination changes: 1-2+ pedal edema Key new findings / data: Tele stable with NSR & occ PACs/PVCs  PLAN: Continue with current medication regimen -- ASA, low dose BB & ACE-I, & statin for CAD  Agree with short term Amiodarone given her intolerance of AFib-RVR, will likley increase BB as OP; no need to anticoagulate. Agree with diuretic as she still has some volume overload - will need supplemental K+ Iron supplementation for anemia (would like to check baseline H/H prior to  discharge)  Phase II Cardiac Rehab  Marykay Lex, M.D., M.S. [facility] [address] [place], [place]  [address]  [phone]  03/31/2011 4:21 PM

## 2011-03-31 NOTE — Progress Notes (Signed)
Patient ambulated 521ft with rolling walker on room air without difficulty. Patient tolerated walk well with only one standing rest break. Encouraged patient to one more walk before bedtime. Stacy Adams

## 2011-04-01 LAB — GLUCOSE, CAPILLARY: Glucose-Capillary: 99 mg/dL (ref 70–99)

## 2011-04-01 MED ORDER — POTASSIUM CHLORIDE CRYS ER 20 MEQ PO TBCR
40.0000 meq | EXTENDED_RELEASE_TABLET | Freq: Every day | ORAL | Status: DC
  Administered 2011-04-01 – 2011-04-02 (×2): 40 meq via ORAL
  Filled 2011-04-01: qty 2

## 2011-04-01 MED ORDER — FUROSEMIDE 10 MG/ML IJ SOLN
40.0000 mg | Freq: Once | INTRAMUSCULAR | Status: AC
Start: 2011-04-01 — End: 2011-04-01
  Administered 2011-04-01: 40 mg via INTRAVENOUS
  Filled 2011-04-01 (×2): qty 4

## 2011-04-01 NOTE — Progress Notes (Signed)
EPW's D/c per MD order and Hospital protocol.All ends intact. Pt reminded to stay in bed for 1 hr. Will continue to monitor closely. 

## 2011-04-01 NOTE — Progress Notes (Signed)
[facility] Progress Note  Subjective:  Day 6 s/p CABG; Breathing better  Objective:   Vital Signs in the last 24 hours: Temp:  [97.8 F (36.6 C)-98.1 F (36.7 C)] 97.8 F (36.6 C) (01/04 0510) Pulse Rate:  [70-75] 70  (01/04 0510) Resp:  [18] 18  (01/04 0510) BP: (135-159)/(54-82) 147/82 mmHg (01/04 0510) SpO2:  [90 %-96 %] 96 % (01/04 0510) Weight:  [67 kg (147 lb 11.3 oz)] 147 lb 11.3 oz (67 kg) (01/04 0421)  Intake/Output from previous day: 01/03 0701 - 01/04 0700 In: 360 [P.O.:360] Out: 901 [Urine:900; Stool:1]  Scheduled:   . amiodarone  400 mg Oral BID  . aspirin EC  325 mg Oral Daily  . docusate sodium  200 mg Oral Daily  . enoxaparin  30 mg Subcutaneous Q24H  . folic acid  1 mg Oral Daily  . furosemide  40 mg Intravenous Once  . furosemide  40 mg Intravenous Once  . furosemide  40 mg Oral Daily  . lactulose  10 g Oral Once  . lisinopril  2.5 mg Oral Daily  . metoprolol tartrate  25 mg Oral TID  . pantoprazole  40 mg Oral QAC breakfast  . polysaccharide iron  150 mg Oral Daily  . potassium chloride  30 mEq Oral Once  . potassium chloride  20 mEq Oral Once  . potassium chloride  40 mEq Oral Daily  . povidone-iodine  1 application Topical BID  . rosuvastatin  10 mg Oral QHS  . sodium chloride  3 mL Intravenous Q12H  . DISCONTD: potassium chloride  20 mEq Oral Daily    Physical Exam:   General appearance: alert, cooperative and no distress Neck: no adenopathy, no carotid bruit, no JVD, supple, symmetrical, trachea midline and thyroid not enlarged, symmetric, no tenderness/mass/nodules Lungs: diminished breath sounds bibasilar; no wheezing Heart: S1, S2 normal Abdomen: soft, non-tender; bowel sounds normal; no masses,  no organomegaly Extremities: mild edema, R>L  Currently NSR; no further A Fib.  PAC's noted with walking.    Lab Results: No results found for this or any previous visit (from the past 24  hour(s)).   Basename 03/31/11 0555  NA 141  K 3.7  CL 101  CO2 28  GLUCOSE 101*  BUN 22  CREATININE 0.86   No results found for this basename: TROPONINI:2,CK,MB:2 in the last 72 hours Hepatic Function Panel No results found for this basename: PROT,ALBUMIN,AST,ALT,ALKPHOS,BILITOT,BILIDIR,IBILI in the last 72 hours }No results found for this basename: INR in the last 72 hours    Imaging:  No results found.     Assessment/Plan:   Principal Problem:  *S/P CABG x 4: CORONARY ARTERY BYPASS GRAFTING (CABG) X 4 (LIMA TO LAD,SVG to Diagonal,SVG to distal Circumflex, SVG to PDA Active Problems:  HYPERLIPIDEMIA  ANXIETY  HYPERTENSION  NSTEMI (non-ST elevated myocardial infarction) Urgent cath  CAD, multiple vessel  Anemia associated with acute blood loss - post op CABG  NSVT (nonsustained ventricular tachycardia)  Ambulating well up to 700 feet today.  Continue diuresis. Cardiac Rehab post d/c in [place].    Lennette Bihari, MD, [facility] 04/01/2011, 8:27 AM

## 2011-04-01 NOTE — Progress Notes (Signed)
Ambulated 550 feet using rolling walker,room air with short breaks at interval but tolerated well,O2 sat post ambulation is 92%,back to recliner,call button within reach.Will continue to monitor. Shaneka Efaw Jamey Reas RN

## 2011-04-01 NOTE — Progress Notes (Signed)
Pt transfered at 3:16 PM to 2038. Report given to [facility].

## 2011-04-01 NOTE — Progress Notes (Addendum)
[address]            [place] [address]          [phone]     6 Days Post-Op  Procedure(s) (LRB): CORONARY ARTERY BYPASS GRAFTING (CABG) (N/A) Subjective: C/O airway congestion with sputum production. Mostly feels better  Objective  Telemetry primarily NSR but some afib with cvr  Temp:  [97.8 F (36.6 C)-98.1 F (36.7 C)] 97.8 F (36.6 C) (01/04 0510) Pulse Rate:  [70-75] 70  (01/04 0510) Resp:  [18] 18  (01/04 0510) BP: (135-159)/(54-82) 147/82 mmHg (01/04 0510) SpO2:  [90 %-96 %] 96 % (01/04 0510) Weight:  [147 lb 11.3 oz (67 kg)] 147 lb 11.3 oz (67 kg) (01/04 0421)   Intake/Output Summary (Last 24 hours) at 04/01/11 0741 Last data filed at 03/31/11 2100  Gross per 24 hour  Intake    360 ml  Output    901 ml  Net   -541 ml       General appearance: alert, cooperative and no distress Heart: regular rate and rhythm and S1, S2 normal Lungs: diminished in the bases Abdomen: benign Extremities: significant bilat LE edema Wound: incisions healing well without signs of infection  Lab Results:  Basename 03/31/11 0555  NA 141  K 3.7  CL 101  CO2 28  GLUCOSE 101*  BUN 22  CREATININE 0.86  CALCIUM 8.9  MG --  PHOS --   No results found for this basename: AST:2,ALT:2,ALKPHOS:2,BILITOT:2,PROT:2,ALBUMIN:2 in the last 72 hours No results found for this basename: LIPASE:2,AMYLASE:2 in the last 72 hours No results found for this basename: WBC:2,NEUTROABS:2,HGB:2,HCT:2,MCV:2,PLT:2 in the last 72 hours No results found for this basename: CKTOTAL:4,CKMB:4,TROPONINI:4 in the last 72 hours No components found with this basename: POCBNP:3 No results found for this basename: DDIMER in the last 72 hours No results found for this basename: HGBA1C in the last 72 hours No results found for this basename: CHOL,HDL,LDLCALC,TRIG,CHOLHDL in the last 72 hours No results found for this basename: TSH,T4TOTAL,FREET3,T3FREE,THYROIDAB in the last  72 hours No results found for this basename: VITAMINB12,FOLATE,FERRITIN,TIBC,IRON,RETICCTPCT in the last 72 hours  Medications: Scheduled    . amiodarone  400 mg Oral BID  . aspirin EC  325 mg Oral Daily  . docusate sodium  200 mg Oral Daily  . enoxaparin  30 mg Subcutaneous Q24H  . folic acid  1 mg Oral Daily  . furosemide  40 mg Intravenous Once  . furosemide  40 mg Oral Daily  . lactulose  10 g Oral Once  . lisinopril  2.5 mg Oral Daily  . metoprolol tartrate  25 mg Oral TID  . pantoprazole  40 mg Oral QAC breakfast  . polysaccharide iron  150 mg Oral Daily  . potassium chloride  30 mEq Oral Once  . potassium chloride  20 mEq Oral Once  . potassium chloride  20 mEq Oral Daily  . povidone-iodine  1 application Topical BID  . rosuvastatin  10 mg Oral QHS  . sodium chloride  3 mL Intravenous Q12H  . DISCONTD: furosemide  40 mg Oral Daily  . DISCONTD: furosemide  40 mg Oral Once     Radiology/Studies:  No results found.  INR: Will add last result for INR, ABG once components are confirmed Will add last 4 CBG results once components are confirmed  Assessment/Plan: S/P Procedure(s) (LRB): CORONARY ARTERY BYPASS GRAFTING (  CABG) (N/A)  1. Afib - mostly controlled on current rx 2. Volume overload with significant edema persists, will give iv lasix again this am, check BNP 3. D/c epw's this am 4. Possible d/c later today if all are in agreement   LOS: 7 days    GOLD,WAYNE E 1/4/20137:41 AM    I have seen and examined Stacy Adams and  Plan D/c Tomorrow  Delight Ovens MD Beeper [phone] Office [phone] 04/01/2011 5:25 PM

## 2011-04-01 NOTE — Progress Notes (Signed)
CARDIAC REHAB PHASE I   PRE:  Rate/Rhythm: 96SR  BP:  Supine:   Sitting:   Standing:   Up in hall with husband   SaO2: 94%RA  MODE:  Ambulation: 700 ft   POST:  Rate/Rhythem: 114 ST lots of PACS  To 85NSR  With rest  BP:  Supine:   Sitting: 142/63  Standing:    SaO2: 92-96%RA 0810-0905 Pt up in hall with husband. Gait steady with her rolling walker. O2 sats good on RA. Pt walked 550 ft rested standing and then went another 150 ft. Lots of PACs upon return to room that disappeared with rest. Notified Dr Tresa Endo of PACs when he came to see pt. Education completed with pt and husband.  Duanne Limerick

## 2011-04-02 DIAGNOSIS — I480 Paroxysmal atrial fibrillation: Secondary | ICD-10-CM | POA: Diagnosis not present

## 2011-04-02 LAB — BASIC METABOLIC PANEL
CO2: 32 mEq/L (ref 19–32)
Calcium: 9 mg/dL (ref 8.4–10.5)
Creatinine, Ser: 0.94 mg/dL (ref 0.50–1.10)
GFR calc non Af Amer: 59 mL/min — ABNORMAL LOW (ref >90)
Sodium: 141 mEq/L (ref 135–145)

## 2011-04-02 MED ORDER — FUROSEMIDE 40 MG PO TABS: 40.0000 mg | ORAL_TABLET | Freq: Every day | ORAL | Status: DC

## 2011-04-02 MED ORDER — POTASSIUM CHLORIDE CRYS ER 20 MEQ PO TBCR: 40.0000 meq | EXTENDED_RELEASE_TABLET | Freq: Every day | ORAL | Status: DC

## 2011-04-02 NOTE — Progress Notes (Signed)
Pt/family given discharge instructions, medication reconciliation, follow up appts and s/sx infection.  Pt/family verbalized understanding of instructions. Thomas Hoff

## 2011-04-02 NOTE — Progress Notes (Signed)
                    [address]            [place] [address]          [phone]     7 Days Post-Op Procedure(s) (LRB): CORONARY ARTERY BYPASS GRAFTING (CABG) (N/A)  Subjective: Feeling much better today.  Diuresed well overnight.  No rhythm issues.    Objective: Vital signs in last 24 hours: Patient Vitals for the past 24 hrs:  BP Temp Temp src Pulse Resp SpO2 Weight  04/02/11 0414 152/72 mmHg 97.2 F (36.2 C) Oral 70  18  95 % 64.5 kg (142 lb 3.2 oz)  04/01/11 2205 158/79 mmHg - - 76  - - -  04/01/11 2052 158/79 mmHg 98.2 F (36.8 C) Oral 76  18  94 % -  04/01/11 1735 158/71 mmHg 97 F (36.1 C) Oral 72  18  94 % -  04/01/11 1400 125/58 mmHg 97.4 F (36.3 C) Oral 65  18  96 % -  04/01/11 1230 118/59 mmHg 97.6 F (36.4 C) - 73  18  98 % -  04/01/11 1200 137/65 mmHg - - 79  18  98 % -  04/01/11 1130 156/64 mmHg - - 88  16  98 % -  04/01/11 1115 161/61 mmHg - - 61  18  97 % -  04/01/11 1100 162/72 mmHg - - 100  18  97 % -  04/01/11 1045 168/68 mmHg 97.6 F (36.4 C) Oral 104  18  96 % -  04/01/11 1031 168/67 mmHg - - 76  - - -   Current Weight  04/02/11 64.5 kg (142 lb 3.2 oz)     Intake/Output from previous day: 01/04 0701 - 01/05 0700 In: 123 [P.O.:120; I.V.:3] Out: 650 [Urine:650]    PHYSICAL EXAM:  Heart: RRR Lungs: clear Wound: clean and dry Extremities: mild LE edema, resolving ecchymosis R thigh  Lab Results: CBC:No results found for this basename: WBC:2,HGB:2,HCT:2,PLT:2 in the last 72 hours BMET:  Basename 04/02/11 0600 03/31/11 0555  NA 141 141  K 3.4* 3.7  CL 95* 101  CO2 32 28  GLUCOSE 93 101*  BUN 19 22  CREATININE 0.94 0.86  CALCIUM 9.0 8.9    PT/INR: No results found for this basename: LABPROT,INR in the last 72 hours   Assessment/Plan: S/P Procedure(s) (LRB): CORONARY ARTERY BYPASS GRAFTING (CABG) (N/A) Vol overload- improved with IV Lasix.  Continue diuresis CV- no further AF. Plan home today.  Instructions  reviewed with patient.   LOS: 8 days    Citlalli Weikel H 04/02/2011

## 2011-04-02 NOTE — Discharge Summary (Signed)
[address]            [name] [address]          [phone]         Discharge Summary-ADDENDUM  Name: Stacy Adams DOB: December 16, 1940 71 y.o. MRN: [id number]  Admission Date: 03/25/2011 Discharge Date: 04/02/2011   ADDENDUM: The patient was originally scheduled for discharge home on 03/31/2011. However she continued to have intermittent episodes of atrial fibrillation and supraventricular tachycardia. She was also significantly volume overloaded and it was felt that she should remain in the hospital for further diuresis and management of her rhythm. She was given IV Lasix to which she has responded well. Her pro BNP on 04/01/2011 was 1313. She has had no further arrhythmias on amiodarone and Lopressor. She otherwise remained stable. She currently is ready for discharge home on 04/02/2011. Please see previously dictated discharge summary for complete details of her history and admission.  Recent vital signs:  Filed Vitals:   04/02/11 0414  BP: 152/72  Pulse: 70  Temp: 97.2 F (36.2 C)  Resp: 18    Recent laboratory studies:  CBC:No results found for this basename: WBC:2,HGB:2,HCT:2,PLT:2 in the last 72 hours BMET:  Basename 04/02/11 0600 03/31/11 0555  NA 141 141  K 3.4* 3.7  CL 95* 101  CO2 32 28  GLUCOSE 93 101*  BUN 19 22  CREATININE 0.94 0.86  CALCIUM 9.0 8.9      Discharge Medications:  Current Discharge Medication List    START taking these medications   Details  amiodarone (PACERONE) 400 MG tablet Take 1 tablet (400 mg total) by mouth 2 (two) times daily. For one week.Patient then to take Amiodarone 400 mg po daily thereafter. Qty: 60 tablet, Refills: 1    folic acid (FOLVITE) 1 MG tablet Take 1 tablet (1 mg total) by mouth daily. For one month then stop. Qty: 30 tablet, Refills: 0    furosemide (LASIX) 40 MG tablet Take 1 tablet (40 mg total) by mouth daily. Qty: 7 tablet, Refills: 0    lisinopril  (PRINIVIL,ZESTRIL) 2.5 MG tablet Take 1 tablet (2.5 mg total) by mouth daily. Qty: 30 tablet, Refills: 1    metoprolol tartrate (LOPRESSOR) 25 MG tablet Take 1 tablet (25 mg total) by mouth 3 (three) times daily. Qty: 90 tablet, Refills: 1    polysaccharide iron (NIFEREX) 150 MG CAPS capsule Take 1 capsule (150 mg total) by mouth daily. For one month then stop. Qty: 30 each, Refills: 0    potassium chloride SA (K-DUR,KLOR-CON) 20 MEQ tablet Take 2 tablets (40 mEq total) by mouth daily. Qty: 14 tablet, Refills: 0    rosuvastatin (CRESTOR) 10 MG tablet Take 1 tablet (10 mg total) by mouth at bedtime. Qty: 30 tablet, Refills: 1    traMADol (ULTRAM) 50 MG tablet Take 1-2 tablets (50-100 mg total) by mouth every 4 (four) hours as needed for pain. Maximum dose= 8 tablets per day Qty: 45 tablet, Refills: 0      CONTINUE these medications which have CHANGED   Details  aspirin EC 325 MG EC tablet Take 1 tablet (325 mg total) by mouth daily. Qty: 30 tablet      CONTINUE these medications which have NOT CHANGED   Details  OVER THE COUNTER MEDICATION Take 1 tablet by mouth daily. "Source of Life" multivitamin     vitamin C (ASCORBIC  ACID) 500 MG tablet Take 500 mg by mouth daily.          Discharge Instructions:  The patient is to refrain from driving, heavy lifting or strenuous activity.  May shower daily and clean incisions with soap and water.  May resume regular diet.  Discharge Orders    Future Appointments: Provider: Department: Dept Phone: Center:   04/21/2011 2:45 PM Delight Ovens, MD [facility] [phone] [facility]      Follow-up Information    Follow up with GERHARDT,EDWARD B, MD. (PA/LAT CXR to be taken on 04/21/2011 at 2:00 pm;Appointment with Dr. Tyrone Sage is on 04/21/2011 at 2:45 pm )    Contact information:   [address] [place] [address] [phone]       Follow up with HARDING,DAVID W. (Call for an appointment for 2 weeks)     Contact information:   [facility] [address] [place] [address] [phone]       Follow up with Carrie Mew. (Call for follow up appointment regarding further surveillance of HGA1C 5.9)    Contact information:   [address] [place] [address] [phone]           Jayr Lupercio H 04/02/2011, 10:37 AM

## 2011-04-02 NOTE — Progress Notes (Signed)
[facility]  DAILY PROGRESS NOTE   Subjective:  No events overnight. Felt some palpitations this am.   Objective:  Temp:  [97 F (36.1 C)-98.2 F (36.8 C)] 97.2 F (36.2 C) (01/05 0414) Pulse Rate:  [61-104] 70  (01/05 0414) Resp:  [16-18] 18  (01/05 0414) BP: (118-168)/(58-79) 152/72 mmHg (01/05 0414) SpO2:  [94 %-98 %] 95 % (01/05 0414) Weight:  [64.5 kg (142 lb 3.2 oz)] 142 lb 3.2 oz (64.5 kg) (01/05 0414) Weight change: -2.5 kg (-5 lb 8.2 oz)  Intake/Output from previous day: 01/04 0701 - 01/05 0700 In: 123 [P.O.:120; I.V.:3] Out: 650 [Urine:650]  Intake/Output from this shift: Total I/O In: 240 [P.O.:240] Out: -   Medications: Current Facility-Administered Medications  Medication Dose Route Frequency Provider Last Rate Last Dose  . acetaminophen (TYLENOL) tablet 650 mg  650 mg Oral Q6H PRN Delight Ovens, MD   650 mg at 04/01/11 1032  . amiodarone (PACERONE) tablet 400 mg  400 mg Oral BID Rowe Clack, PA   400 mg at 04/01/11 2205  . aspirin EC tablet 325 mg  325 mg Oral Daily Delight Ovens, MD   325 mg at 04/01/11 1031  . bisacodyl (DULCOLAX) EC tablet 10 mg  10 mg Oral Daily PRN Delight Ovens, MD   10 mg at 03/28/11 1478   Or  . bisacodyl (DULCOLAX) suppository 10 mg  10 mg Rectal Daily PRN Delight Ovens, MD      . docusate sodium (COLACE) capsule 200 mg  200 mg Oral Daily Delight Ovens, MD   200 mg at 04/01/11 1031  . enoxaparin (LOVENOX) injection 30 mg  30 mg Subcutaneous Q24H Delight Ovens, MD   30 mg at 04/01/11 2206  . folic acid (FOLVITE) tablet 1 mg  1 mg Oral Daily Ardelle Balls, PA   1 mg at 04/01/11 1031  . furosemide (LASIX) injection 40 mg  40 mg Intravenous Once Rowe Clack, PA   40 mg at 04/01/11 1032  . furosemide (LASIX) tablet 40 mg  40 mg Oral Daily Ardelle Balls, PA   40 mg at 04/01/11 1032  . lactulose (CHRONULAC) 10 GM/15ML solution 10 g  10 g Oral Once Ardelle Balls, PA       . lisinopril (PRINIVIL,ZESTRIL) tablet 2.5 mg  2.5 mg Oral Daily Ardelle Balls, PA   2.5 mg at 04/01/11 1035  . metoprolol (LOPRESSOR) injection 2.5 mg  2.5 mg Intravenous Q15 min PRN Loreli Slot, MD   2.5 mg at 03/29/11 1011  . metoprolol tartrate (LOPRESSOR) tablet 25 mg  25 mg Oral TID Ardelle Balls, PA   25 mg at 04/01/11 2205  . ondansetron (ZOFRAN) tablet 4 mg  4 mg Oral Q6H PRN Delight Ovens, MD       Or  . ondansetron [facility]) injection 4 mg  4 mg Intravenous Q6H PRN Delight Ovens, MD   4 mg at 03/28/11 1649  . pantoprazole (PROTONIX) EC tablet 40 mg  40 mg Oral QAC breakfast Delight Ovens, MD   40 mg at 04/02/11 0859  . polysaccharide iron (NIFEREX) capsule 150 mg  150 mg Oral Daily Ardelle Balls, PA   150 mg at 04/01/11 1032  . potassium chloride SA (K-DUR,KLOR-CON) CR tablet 20 mEq  20 mEq Oral Once Ardelle Balls, PA      . potassium chloride SA (K-DUR,KLOR-CON) CR tablet 40  mEq  40 mEq Oral Daily Rowe Clack, PA   40 mEq at 04/01/11 1031  . povidone-iodine (BETADINE) 10 % external solution 1 application  1 application Topical BID Delight Ovens, MD   1 application at 04/01/11 2200  . rosuvastatin (CRESTOR) tablet 10 mg  10 mg Oral QHS Delight Ovens, MD   10 mg at 04/01/11 2206  . sodium chloride 0.9 % injection 3 mL  3 mL Intravenous Q12H Delight Ovens, MD   3 mL at 04/01/11 2206  . sodium chloride 0.9 % injection 3 mL  3 mL Intravenous PRN Delight Ovens, MD      . traMADol Janean Sark) tablet 50-100 mg  50-100 mg Oral Q4H PRN Delight Ovens, MD        Physical Exam: General appearance: alert and no distress Neck: no adenopathy, no carotid bruit, no JVD, supple, symmetrical, trachea midline and thyroid not enlarged, symmetric, no tenderness/mass/nodules Lungs: clear to auscultation bilaterally Heart: regular rate and rhythm, S1, S2 normal, no murmur, click, rub or gallop Abdomen: soft, non-tender; bowel sounds  normal; no masses,  no organomegaly Extremities: extremities normal, atraumatic, no cyanosis or edema  Lab Results: Results for orders placed during the hospital encounter of 03/25/11 (from the past 48 hour(s))  PRO B NATRIURETIC PEPTIDE     Status: Abnormal   Collection Time   04/01/11 10:08 AM      Component Value Range Comment   Pro B Natriuretic peptide (BNP) 1313.0 (*) 0 - 125 (pg/mL)   GLUCOSE, CAPILLARY     Status: Normal   Collection Time   04/01/11 11:51 AM      Component Value Range Comment   Glucose-Capillary 99  70 - 99 (mg/dL)   BASIC METABOLIC PANEL     Status: Abnormal   Collection Time   04/02/11  6:00 AM      Component Value Range Comment   Sodium 141  135 - 145 (mEq/L)    Potassium 3.4 (*) 3.5 - 5.1 (mEq/L)    Chloride 95 (*) 96 - 112 (mEq/L)    CO2 32  19 - 32 (mEq/L)    Glucose, Bld 93  70 - 99 (mg/dL)    BUN 19  6 - 23 (mg/dL)    Creatinine, Ser 8.29  0.50 - 1.10 (mg/dL)    Calcium 9.0  8.4 - 10.5 (mg/dL)    GFR calc non Af Amer 59 (*) >90 (mL/min)    GFR calc Af Amer 69 (*) >90 (mL/min)     Imaging: No results found.  Assessment:  1. Principal Problem: 2.  *NSTEMI (non-ST elevated myocardial infarction) Urgent cath 3. Active Problems: 4.  HYPERLIPIDEMIA 5.  ANXIETY 6.  HYPERTENSION 7.  CAD, multiple vessel 8.  Anemia associated with acute blood loss - post op CABG 9.  S/P CABG x 4: CORONARY ARTERY BYPASS GRAFTING (CABG) X 4 (LIMA TO LAD,SVG to Diagonal,SVG to distal Circumflex, SVG to PDA 10.  NSVT (nonsustained ventricular tachycardia) 11.  PAF (paroxysmal atrial fibrillation) post-CABG 12.   Plan:  1. Probably ok for discharge. ?today.  Will follow-up with Dr. Herbie Baltimore in 2 weeks.  Time Spent Directly with Patient:  15 minutes  Length of Stay:  LOS: 8 days   Chrystie Nose, MD Attending Cardiologist [facility]  HILTY,Kenneth C 04/02/2011, 10:08 AM

## 2011-04-04 MED FILL — Heparin Sodium (Porcine) Inj 1000 Unit/ML: INTRAMUSCULAR | Qty: 60 | Status: AC

## 2011-04-04 MED FILL — Sodium Chloride IV Soln 0.9%: INTRAVENOUS | Qty: 1000 | Status: AC

## 2011-04-04 MED FILL — Sodium Chloride Irrigation Soln 0.9%: Qty: 3000 | Status: AC

## 2011-04-04 MED FILL — Electrolyte-R (PH 7.4) Solution: INTRAVENOUS | Qty: 5000 | Status: AC

## 2011-04-21 ENCOUNTER — Ambulatory Visit: Payer: Medicare Other | Admitting: Cardiothoracic Surgery

## 2011-04-27 ENCOUNTER — Other Ambulatory Visit: Payer: Self-pay | Admitting: Cardiothoracic Surgery

## 2011-04-27 DIAGNOSIS — Z951 Presence of aortocoronary bypass graft: Secondary | ICD-10-CM

## 2011-04-27 DIAGNOSIS — I2510 Atherosclerotic heart disease of native coronary artery without angina pectoris: Secondary | ICD-10-CM

## 2011-04-28 ENCOUNTER — Encounter: Payer: Self-pay | Admitting: Cardiothoracic Surgery

## 2011-04-28 ENCOUNTER — Ambulatory Visit
Admission: RE | Admit: 2011-04-28 | Discharge: 2011-04-28 | Disposition: A | Discharge status: Home / Self Care | Payer: Medicare Other | Source: Ambulatory Visit | Attending: Cardiothoracic Surgery | Admitting: Cardiothoracic Surgery

## 2011-04-28 ENCOUNTER — Ambulatory Visit (INDEPENDENT_AMBULATORY_CARE_PROVIDER_SITE_OTHER): Payer: Self-pay | Admitting: Cardiothoracic Surgery

## 2011-04-28 VITALS — BP 193/74 | HR 60 | Resp 20 | Ht <= 58 in | Wt 138.0 lb

## 2011-04-28 DIAGNOSIS — I2510 Atherosclerotic heart disease of native coronary artery without angina pectoris: Secondary | ICD-10-CM

## 2011-04-28 DIAGNOSIS — Z951 Presence of aortocoronary bypass graft: Secondary | ICD-10-CM

## 2011-04-28 NOTE — Patient Instructions (Signed)
Coronary Artery Bypass Grafting Care After Refer to this sheet in the next few weeks. These instructions provide you with information on caring for yourself after your procedure. Your caregiver may also give you more specific instructions. Your treatment has been planned according to current medical practices, but problems sometimes occur. Call your caregiver if you have any problems or questions after your procedure.  Recovery from open heart surgery will be different for everyone. Some people feel well after 3 or 4 weeks, while for others it takes longer. After heart surgery, it may be normal to:  Not have an appetite, feel nauseated by the smell of food, or only want to eat a small amount.   Be constipated because of changes in your diet, activity, and medicines. Eat foods high in fiber. Add fresh fruits and vegetables to your diet. Stool softeners may be helpful.   Feel sad or unhappy. You may be frustrated or cranky. You may have good days and bad days. Do not give up. Talk to your caregiver if you do not feel better.   Feel weakness and fatigue. You many need physical therapy or cardiac rehabilitation to get your strength back.   Develop an irregular heartbeat called atrial fibrillation. Symptoms of atrial fibrillation are a fast, irregular heartbeat or feelings of fluttery heartbeats, shortness of breath, low blood pressure, and dizziness. If these symptoms develop, see your caregiver right away.  MEDICATION  Have a list of all the medicines you will be taking when you leave the hospital. For every medicine, know the following:   Name.   Exact dose.   Time of day to be taken.   How often it should be taken.   Why you are taking it.   Ask which medicines should or should not be taken together. If you take more than one heart medicine, ask if it is okay to take them together. Some heart medicines should not be taken at the same time because they may lower your blood pressure too  much.   Narcotic pain medicine can cause constipation. Eat fresh fruits and vegetables. Add fiber to your diet. Stool softener medicine may help relieve constipation.   Keep a copy of your medicines with you at all times.   Do not add or stop taking any medicine until you check with your caregiver.   Medicines can have side effects. Call your caregiver who prescribed the medicine if you:   Start throwing up, have diarrhea, or have stomach pain.   Feel dizzy or lightheaded when you stand up.   Feel your heart is skipping beats or is beating too fast or too slow.   Develop a rash.   Notice unusual bruising or bleeding.  HOME CARE INSTRUCTIONS  After heart surgery, it is important to learn how to take your pulse. Have your caregiver show you how to take your pulse.   Use your incentive spirometer. Ask your caregiver how long after surgery you need to use it.  Care of your chest incision  Tell your caregiver right away if you notice clicking in your chest (sternum).   Support your chest with a pillow or your arms when you take deep breaths and cough.   Follow your caregiver's instructions about when you can bathe or swim.   Protect your incision from sunlight during the first year to keep the scar from getting dark.   Tell your caregiver if you notice:   Increased tenderness of your incision.   Increased redness   or swelling around your incision.   Drainage or pus from your incision.  Care of your leg incision(s)  Avoid crossing your legs.   Avoid sitting for long periods of time. Change positions every half hour.   Elevate your leg(s) when you are sitting.   Check your leg(s) daily for swelling. Check the incisions for redness or drainage.   Wear your elastic stockings as told by your caregiver. Take them off at bedtime.  Diet  Diet is very important to heart health.   Eat plenty of fresh fruits and vegetables. Meats should be lean cut. Avoid canned, processed, and  fried foods.   Talk to a dietician. They can teach you how to make healthy food and drink choices.  Weight  Weigh yourself every day. This is important because it helps to know if you are retaining fluid that may make your heart and lungs work harder.   Use the same scale each time.   Weigh yourself every morning at the same time. You should do this after you go to the bathroom, but before you eat breakfast.   Your weight will be more accurate if you do not wear any clothes.   Record your weight.   Tell your caregiver if you have gained 2 pounds or more overnight.  Activity Stop any activity at once if you have chest pain, shortness of breath, irregular heartbeats, or dizziness. Get help right away if you have any of these symptoms.  Bathing.  Avoid soaking in a bath or hot tub until your incisions are healed.   Rest. You need a balance of rest and activity.   Exercise. Exercise per your caregiver's advice. You may need physical therapy or cardiac rehabilitation to help strengthen your muscles and build your endurance.   Climbing stairs. Unless your caregiver tells you not to climb stairs, go up stairs slowly and rest if you tire. Do not pull yourself up by the handrail.   Driving a car. Follow your caregiver's advice on when you may drive. You may ride as a passenger at any time. When traveling for long periods of time in a car, get out of the car and walk around for a few minutes every 2 hours.   Lifting. Avoid lifting, pushing, or pulling anything heavier than 10 pounds for 6 weeks after surgery or as told by your caregiver.   Returning to work. Check with your caregiver. People heal at different rates. Most people will be able to go back to work 6 to 12 weeks after surgery.   Sexual activity. You may resume sexual relations as told by your caregiver.  SEEK MEDICAL CARE IF:  Any of your incisions are red, painful, or have any type of drainage coming from them.   You have an  oral temperature above 102 F (38.9 C).   You have ankle or leg swelling.   You have pain in your legs.   You have weight gain of 2 or more pounds a day.   You feel dizzy or lightheaded when you stand up.  SEEK IMMEDIATE MEDICAL CARE IF:  You have angina or chest pain that goes to your jaw or arms. Call your local emergency services right away.   You have shortness of breath at rest or with activity.   You have a fast or irregular heartbeat (arrhythmia).   There is a "clicking" in your sternum when you move.   You have numbness or weakness in your arms or legs.    MAKE SURE YOU:  Understand these instructions.   Will watch your condition.   Will get help right away if you are not doing well or get worse.  Document Released: 10/01/2004 Document Revised: 11/24/2010 Document Reviewed: 05/19/2010 [employer] Patient Information 2012 [employer]. 

## 2011-04-28 NOTE — Progress Notes (Signed)
[address]            [place] [address]          [phone]       QUYEN CUTSFORTH [facility] Medical Record #[id number] Date of Birth: 04-20-1940  Dr Herbie Baltimore MD Carrie Mew, MD, MD  Chief Complaint:   PostOp Follow Up Visit Critical three-vessel disease with  subendocardial myocardial infarction.  SURGICAL PROCEDURE: Coronary artery bypass grafting x4 with the left  internal mammary to the left anterior descending coronary artery,  reverse saphenous vein graft to the diagonal coronary artery, reverse  saphenous vein graft to the distal circumflex coronary artery, reverse  saphenous vein graft to the distal right coronary artery with bilateral  thigh endo-vein harvesting. 03/26/11  History of Present Illness:     Patient returns to the office today in followup after her recent coronary bypass grafting. She seems to be making reasonable progress postoperatively. He's increasing her physical activity appropriately. She's had no overt symptoms of congestive heart failure. Her blood pressure has been elevated and she was started on an increased dose of lisinopril 2 days ago but has not started taking the increased dose yet.   History  Smoking status  . Never Smoker   Smokeless tobacco  . Never Used       Allergies  Allergen Reactions  . Sulfa Antibiotics     Current Outpatient Prescriptions  Medication Sig Dispense Refill  . amiodarone (PACERONE) 400 MG tablet Take 200 mg by mouth daily. 200mg  po every day      . folic acid (FOLVITE) 1 MG tablet Take 1 tablet (1 mg total) by mouth daily. For one month then stop.  30 tablet  0  . furosemide (LASIX) 40 MG tablet Take 20 mg by mouth daily as needed.      Marland Kitchen lisinopril (PRINIVIL,ZESTRIL) 2.5 MG tablet Take 5 mg by mouth daily.      . metoprolol tartrate (LOPRESSOR) 25 MG tablet Take 1 tablet (25 mg total) by mouth 3 (three) times daily.  90 tablet  1  . OVER THE COUNTER  MEDICATION Take 1 tablet by mouth daily. "Source of Life" multivitamin       . polysaccharide iron (NIFEREX) 150 MG CAPS capsule Take 1 capsule (150 mg total) by mouth daily. For one month then stop.  30 each  0  . rosuvastatin (CRESTOR) 10 MG tablet Take 1 tablet (10 mg total) by mouth at bedtime.  30 tablet  1  . vitamin C (ASCORBIC ACID) 500 MG tablet Take 500 mg by mouth daily.             Physical Exam: BP 193/74  Pulse 60  Resp 20  Ht 4\' 10"  (1.473 m)  Wt 138 lb (62.596 kg)  BMI 28.84 kg/m2  SpO2 92%  General appearance: alert and cooperative Neurologic: intact Heart: regular rate and rhythm, S1, S2 normal, no murmur, click, rub or gallop Lungs: clear to auscultation bilaterally Abdomen: soft, non-tender; bowel sounds normal; no masses,  no organomegaly Extremities: extremities normal, atraumatic, no cyanosis or edema, edema Patient has mild pedal edema and Vein harvest sites are well-healed Wound: Sternum is stable and well healed   Diagnostic Studies & Laboratory data:         Recent Radiology Findings: Dg Chest 2 View  04/28/2011  *RADIOLOGY REPORT*  Clinical Data: Follow-up  status post CABG.  CHEST - 2 VIEW  Comparison: 03/29/2011 and 03/28/2011.  Findings: The heart size and mediastinal contours are stable status post CABG.  There are improving pleural effusions and bibasilar atelectasis.  No pneumothorax or edema is identified.  IMPRESSION: Improving pleural effusions and bibasilar atelectasis.  Original Report Authenticated By: Gerrianne Scale, M.D.      Recent Labs: Lab Results  Component Value Date   WBC 11.9* 03/29/2011   HGB 8.7* 03/29/2011   HCT 26.5* 03/29/2011   PLT 150 03/29/2011   GLUCOSE 93 04/02/2011   CHOL 272* 03/25/2011   TRIG 197* 03/25/2011   HDL 37* 03/25/2011   LDLDIRECT 203.5 01/09/2008   LDLCALC 196* 03/25/2011   ALT 28 03/25/2011   AST 32 03/25/2011   NA 141 04/02/2011   K 3.4* 04/02/2011   CL 95* 04/02/2011   CREATININE 0.94 04/02/2011   BUN 19  04/02/2011   CO2 32 04/02/2011   TSH 3.830 03/25/2011   INR 1.36 03/26/2011   HGBA1C 5.9* 03/25/2011      Assessment / Plan:      Patient is stable after recent coronary artery bypass grafting. Her blood pressure is elevated. I've encouradged her to take the increased dose of lisinopril as prescribed by Dr. Mardella Layman encouraged her to enroll in a cardiac rehabilitation program in [place] . Overall am pleased with her progress postoperatively.  The patient is currently on Crestor but prefers not to take any cholesterol medication, I told her not to stop it without discussing it with her physicians.    Delight Ovens MD 04/28/2011 4:09 PM

## 2012-11-15 ENCOUNTER — Ambulatory Visit (INDEPENDENT_AMBULATORY_CARE_PROVIDER_SITE_OTHER): Payer: Medicare Other | Admitting: Cardiology

## 2012-11-15 VITALS — BP 130/80 | HR 52 | Ht 60.0 in | Wt 142.9 lb

## 2012-11-15 DIAGNOSIS — I472 Ventricular tachycardia, unspecified: Secondary | ICD-10-CM

## 2012-11-15 DIAGNOSIS — I2510 Atherosclerotic heart disease of native coronary artery without angina pectoris: Secondary | ICD-10-CM

## 2012-11-15 DIAGNOSIS — F411 Generalized anxiety disorder: Secondary | ICD-10-CM

## 2012-11-15 DIAGNOSIS — I480 Paroxysmal atrial fibrillation: Secondary | ICD-10-CM

## 2012-11-15 DIAGNOSIS — I10 Essential (primary) hypertension: Secondary | ICD-10-CM

## 2012-11-15 DIAGNOSIS — E785 Hyperlipidemia, unspecified: Secondary | ICD-10-CM

## 2012-11-15 DIAGNOSIS — I214 Non-ST elevation (NSTEMI) myocardial infarction: Secondary | ICD-10-CM

## 2012-11-15 DIAGNOSIS — I4891 Unspecified atrial fibrillation: Secondary | ICD-10-CM

## 2012-11-15 DIAGNOSIS — Z951 Presence of aortocoronary bypass graft: Secondary | ICD-10-CM

## 2012-11-15 MED ORDER — FENOFIBRATE 48 MG PO TABS: 48.0000 mg | ORAL_TABLET | Freq: Every day | ORAL | Status: DC

## 2012-11-15 NOTE — Patient Instructions (Addendum)
All in all things seem doing relatively well.  I will be concerned that your cholesterol is still high.  I am glad to see that your symptoms get better with stopping the Crestor, but we'll do need to replace that with a different medication.  All to restart the niacin, and has been discussed to take 500 mg a half an hour after her half dose of aspirin and do that twice a day.  The new medicine is called fenofibrate (TriCor) 40 mg daily  Imagine your primary doctor to recheck your cholesterol in about 3 months.  We'll see how it  is working.  Congratulate you on how well you're doing with your exercise.  This is outstanding.  It really shows that you're taking it the heart and I appreciate your effort.  I think her well enough for me to just to see you back on an annual basis, provide your still seeing your primary doctor routinely.  Dear for my office within a month or so of your visit.  If you have not, please contact the office to ensure that you are scheduled.  Marykay Lex, MD

## 2012-11-19 ENCOUNTER — Telehealth: Payer: Self-pay | Admitting: Cardiology

## 2012-11-19 MED ORDER — PRAVASTATIN SODIUM 20 MG PO TABS: 20.0000 mg | ORAL_TABLET | Freq: Every evening | ORAL | Status: DC

## 2012-11-19 NOTE — Telephone Encounter (Signed)
Returned call.  Pt stated she has never taken the Fenofibrate and would like to switch to something she's had before.  Stated she would like to switch to Pravachol or Zocor b/c she's had them before.  Pt informed Dr. Herbie Baltimore will be notified and she will be notified when a response is received.  Pt verbalized understanding and agreed w/ plan.  Message forwarded to Dr. Herbie Baltimore.

## 2012-11-19 NOTE — Telephone Encounter (Signed)
Returned call and informed pt per instructions by MD/PA.  Pt verbalized understanding and asked for 20 mg instead of 40 mg.  Dr. Herbie Baltimore notified and VO given to change to 20 mg.  Call to pt and informed.  Pt agreed w/ plan.  Rx sent to pharmacy.

## 2012-11-19 NOTE — Telephone Encounter (Signed)
Do pravachol 40 mg then.  Tell her that if her Sx occurred with Crestor, they may well occur with Pravachol, but we can retry it.  Fnofibrate is a different class of medication & may not have the same side effects.  Marykay Lex, MD

## 2012-11-19 NOTE — Telephone Encounter (Signed)
Would like for Dr Herbie Baltimore to change Her Fenofibrate 48-she have not taken any of it.Would like to have something else.

## 2012-11-29 ENCOUNTER — Encounter: Payer: Self-pay | Admitting: Cardiology

## 2012-11-29 NOTE — Assessment & Plan Note (Signed)
Adequate blood pressure control on twice a day Lopressor, lisinopril plus HCTZ.  Stable. Continue current management.

## 2012-11-29 NOTE — Progress Notes (Signed)
Patient ID: Evelene Croon, female   DOB: 22-Jul-1940, 72 y.o.   MRN: [id number] PCP: Simone Curia, MD  Clinic Note: Chief Complaint  Patient presents with  . Follow-up    follow up 6 months    HPI: ZAELYN NOACK is a 72 y.o. female with a PMH below who presents today for 47 -month followup of her coronary disease. I met her when she presented with a non-ST elevation MI back in December 2012. I last saw her in November 2013.  Interval History: Since her last visit, she's been doing quite well. She had stopped Crestor due to cramping in the past. Since then she to walk every day. She denies any symptoms of chest pressure/angina with rest or exertion. No dyspnea at rest or exertion.  The remainder of Cardiovascular ROS: negative for - edema, irregular heartbeat, loss of consciousness, murmur, orthopnea, palpitations, paroxysmal nocturnal dyspnea, rapid heart rate or shortness of breath is as follows: Additional cardiac review of systems: Lightheadedness - no, dizziness - no, syncope/near-syncope - no; TIA/amaurosis fugax - no Melena - no, hematochezia no; hematuria - no; nosebleeds - no; claudication - no   Past Medical History  Diagnosis Date  . NSTEMI (non-ST elevated myocardial infarction) Urgent cath 03/25/2011    Multivessel CAD  . CAD (coronary artery disease), native coronary artery 03/25/2011    80-90% LAD after D1 followed by 60-70% consecutive lesions at T2. Circumflex: 95% ostial, RCA 95-99% --> referred for CABG  . S/P CABG x 4 03/26/2011    LIMA-LAD, SVG-Diag, SVG-Cx-OM, SVG-RCA; Intra-Op TEE: EF 60-65%.  Marland Kitchen PAF (paroxysmal atrial fibrillation) January 2013    Postop A. fib, no recurrence  . HYPERTENSION 12/21/2006  . Hyperlipidemia 12/21/2006  . CAD, multiple vessel 08/24/2007  . Anxiety   . GERD (gastroesophageal reflux disease)   . Osteopenia 12/21/2006    Prior Cardiac Evaluation and Past Surgical History: Past Surgical History  Procedure Laterality Date  .  Abdominal hysterectomy    . Coronary artery bypass graft  03/26/2011    Procedure: CORONARY ARTERY BYPASS GRAFTING (CABG);  Surgeon: Delight Ovens, MD;  Location: [facility] OR;  Service: Open Heart Surgery;  Laterality: N/A;    Allergies  Allergen Reactions  . Sulfa Antibiotics     Current Outpatient Prescriptions  Medication Sig Dispense Refill  . aspirin 325 MG tablet Take 325 mg by mouth daily.      . furosemide (LASIX) 40 MG tablet Take 20 mg by mouth daily as needed.      . hydrochlorothiazide (HYDRODIURIL) 25 MG tablet Take 1 tablet by mouth daily.      Marland Kitchen lisinopril (PRINIVIL,ZESTRIL) 30 MG tablet Take 30 mg by mouth daily.      . metoprolol tartrate (LOPRESSOR) 25 MG tablet Take 50 mg by mouth 2 (two) times daily.      Marland Kitchen OVER THE COUNTER MEDICATION Take 1 tablet by mouth daily. "Source of Life" multivitamin       . vitamin C (ASCORBIC ACID) 500 MG tablet Take 500 mg by mouth daily.        . pravastatin (PRAVACHOL) 20 MG tablet Take 1 tablet (20 mg total) by mouth every evening.  30 tablet  5   No current facility-administered medications for this visit.    History   Social History Narrative   She is a married, mother of one was 1 grandchild that unfortunately died early. She is exercising anywhere from 15-60 minutes a day. She does not smoke or  drink.    ROS: A comprehensive Review of Systems - Negative except Mild osteoporosis pains. The cramping was thought that she stopped Crestor. We did try to change her to fenofibrate, but she was reluctant to do so. No dry cough.  PHYSICAL EXAM BP 130/80  Pulse 52  Ht 5' (1.524 m)  Wt 142 lb 14.4 oz (64.819 kg)  BMI 27.91 kg/m2 General appearance: alert, cooperative, appears stated age, no distress and answers questions appropriate. Overweight, but not obese. Well groomed well-nourished. Neck: no adenopathy, no carotid bruit, no JVD, supple, symmetrical, trachea midline and thyroid not enlarged, symmetric, no  tenderness/mass/nodules Lungs: clear to auscultation bilaterally, normal percussion bilaterally and Nonlabored, good air movement. No W./R./R. Heart: regular rate and rhythm, S1, S2 normal, no murmur, click, rub or gallop and normal apical impulse Abdomen: soft, non-tender; bowel sounds normal; no masses,  no organomegaly Extremities: extremities normal, atraumatic, no cyanosis or edema, no edema, redness or tenderness in the calves or thighs and no ulcers, gangrene or trophic changes Pulses: 2+ and symmetric Neurologic: Grossly normal HEENT: [place]/AT, EOMI, MMM, anicteric sclera  [id number] today: Yes Rate: 52 , Rhythm:  sinus bradycardia, right ulna branch block. Lateral T wave inversions. No change from before  Recent Labs: she just had labs checked by her primary physician a week ago. I do not have them currently.  ASSESSMENT / PLAN: S/P CABG x 4: CORONARY ARTERY BYPASS GRAFTING (CABG) X 4 (LIMA TO LAD,SVG to Diagonal,SVG to distal Circumflex, SVG to PDA She is doing quite well overall for cardiac standpoint. No symptoms suggestive of angina or heart failure. She is on aspirin, beta blocker and ACE inhibitor. Next  She is not on statin because of Crestor intolerance. As she is not having active symptoms. I would not routinely check a stress test until 4-5 years post CABG.  CAD, multiple vessel On stable regimen.  HYPERLIPIDEMIA Previously is not well controlled. We'll restart Pravachol at 20 mg daily. Recheck lipids in roughly 3 months. If she doesn't tolerate Crestor or Pravachol I believe she's been on Lipitor since that in the past. He could try Livalo as the last option.  HYPERTENSION Adequate blood pressure control on twice a day Lopressor, lisinopril plus HCTZ.  Stable. Continue current management.  PAF (paroxysmal atrial fibrillation) post-CABG As best I can tell, she is not having further recurrences.    Orders Placed This Encounter  Procedures  . EKG 12-Lead    New Rx: -- was listed above after ordering pravastatin (PRAVACHOL) 20 MG tablet Take 1 tablet (20 mg total) by mouth every evening.  30 tablet  5    Followup:  one year   DAVID W. Herbie Baltimore, M.D., M.S. [facility] [address] [place], [place]  [address]  [phone] Pager # [phone]

## 2012-11-29 NOTE — Assessment & Plan Note (Signed)
On stable regimen.

## 2012-11-29 NOTE — Assessment & Plan Note (Signed)
As best I can tell, she is not having further recurrences.

## 2012-11-29 NOTE — Assessment & Plan Note (Addendum)
She is doing quite well overall for cardiac standpoint. No symptoms suggestive of angina or heart failure. She is on aspirin, beta blocker and ACE inhibitor. Next  She is not on statin because of Crestor intolerance. As she is not having active symptoms. I would not routinely check a stress test until 4-5 years post CABG.

## 2012-11-29 NOTE — Assessment & Plan Note (Addendum)
Previously is not well controlled. We'll restart Pravachol at 20 mg daily. Recheck lipids in roughly 3 months. If she doesn't tolerate Crestor or Pravachol I believe she's been on Lipitor since that in the past. He could try Livalo as the last option.

## 2013-02-11 IMAGING — CR DG CHEST 1V PORT
1 series · 1 of 1 positions shown · non-contrast
Comparison: None.

CLINICAL DATA: MI.  Preoperative respiratory evaluation.

PORTABLE CHEST - 1 VIEW [DATE]/0020 0002 hours:

[AP]
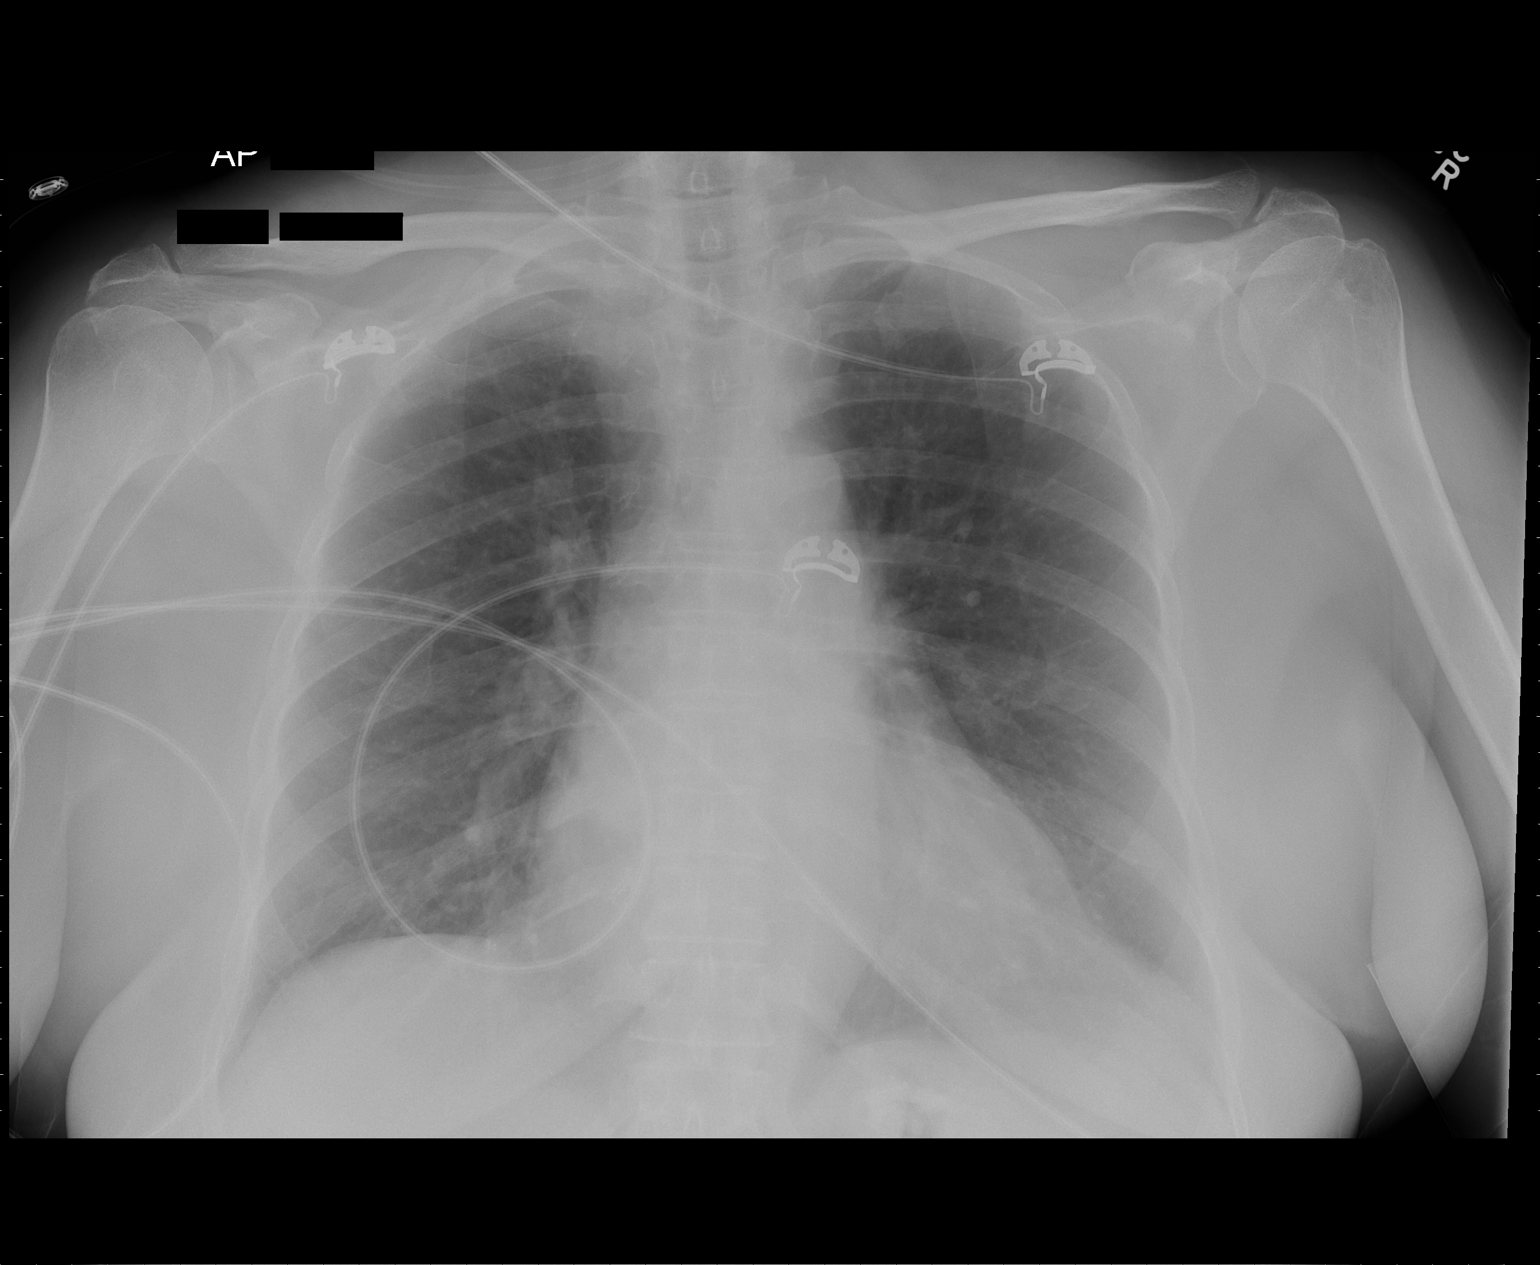

[1 of 1 positions shown; findings below may reference images not displayed]

FINDINGS: Cardiac silhouette mildly enlarged for the AP portable
technique.  Thoracic aorta mildly tortuous.  Hilar and mediastinal
contours otherwise unremarkable.  Lungs clear.  Pulmonary
vascularity normal.  No pleural effusions.  Prominent paracardiac
fat pad on the left.
IMPRESSION: Mild cardiomegaly.  No acute cardiopulmonary disease.

## 2013-02-14 IMAGING — CR DG CHEST 1V PORT
1 series · 1 of 1 positions shown · non-contrast
Comparison: 03/27/2011

CLINICAL DATA: Postoperative exam status post CABG

PORTABLE CHEST - 1 VIEW

[view not recorded]
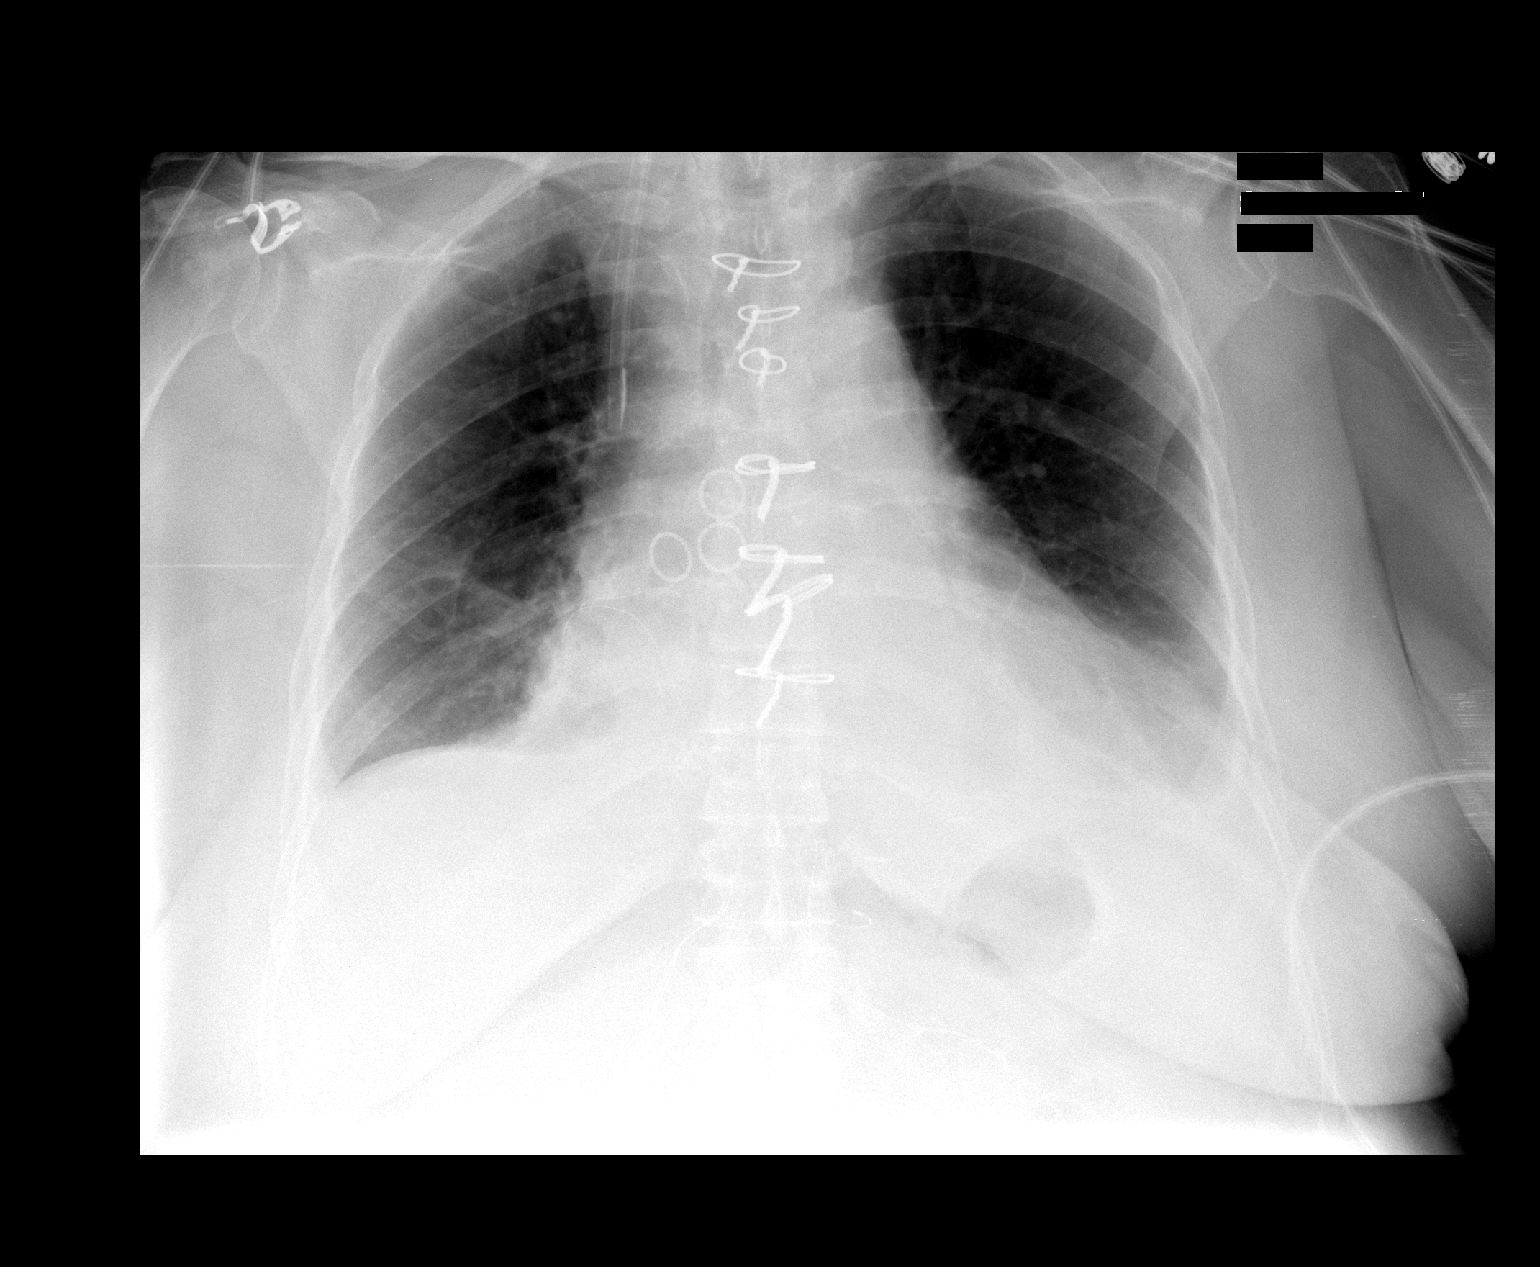

[1 of 1 positions shown; findings below may reference images not displayed]

FINDINGS: Chest tube and mediastinal drain have been removed.
Right IJ Swan-Ganz catheter has been removed and the sheath remains
in place with its tip over the mid SVC.  Moderate enlargement
cardiomediastinal silhouette is stable without edema.  Curvilinear
bibasilar atelectasis again noted.  Trace left greater than right
pleural effusions are stable.  No pneumothorax.  Epicardial pacer
wires again noted.
IMPRESSION: Trace bilateral pleural effusions and bibasilar atelectasis but no
pneumothorax after removal of Swan-Ganz catheter or chest tube.

## 2013-02-15 IMAGING — CR DG CHEST 2V
2 series · 2 of 2 positions shown · non-contrast
Comparison: 03/28/2011, 03/27/2011

CLINICAL DATA: Follow-up CABG

CHEST - 2 VIEW

[w chest pa]
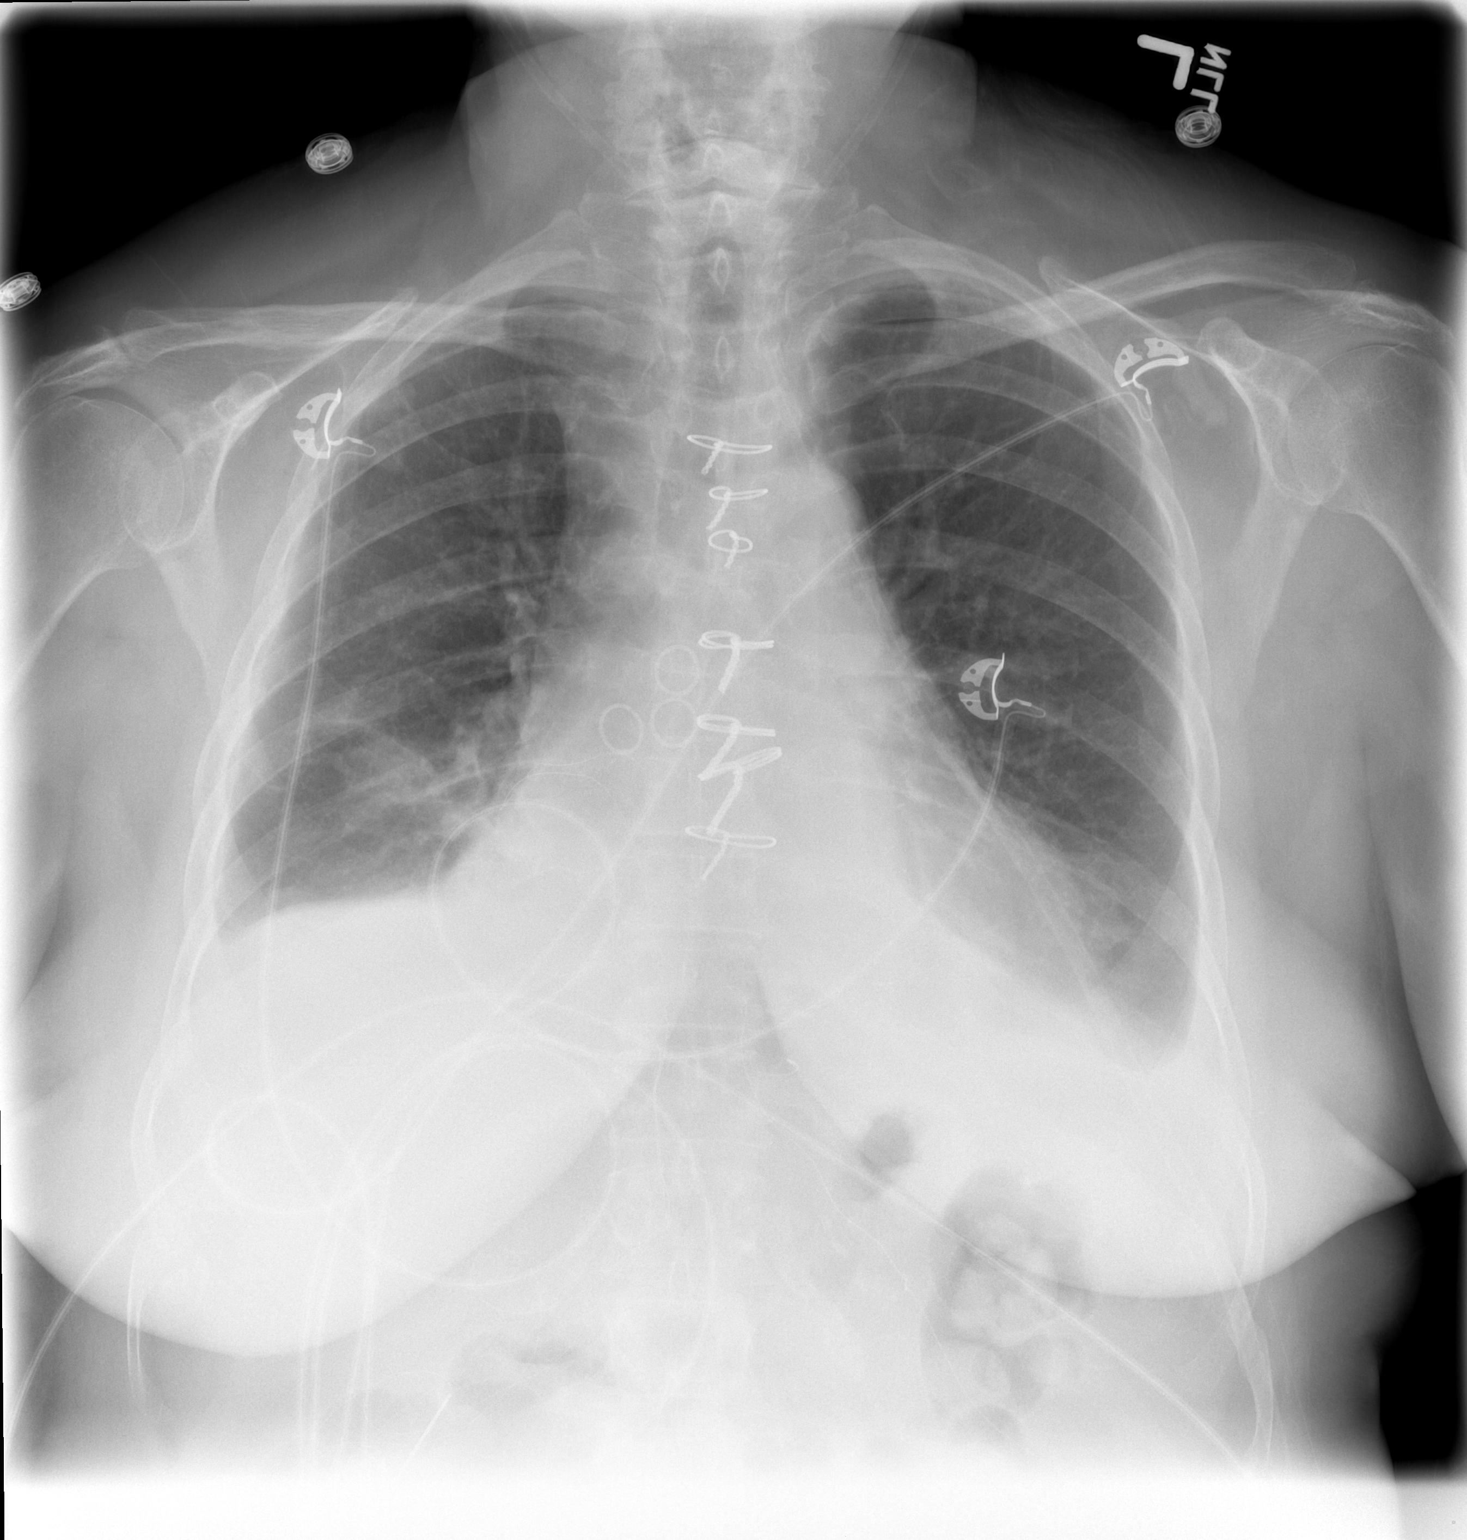

[w chest lat]
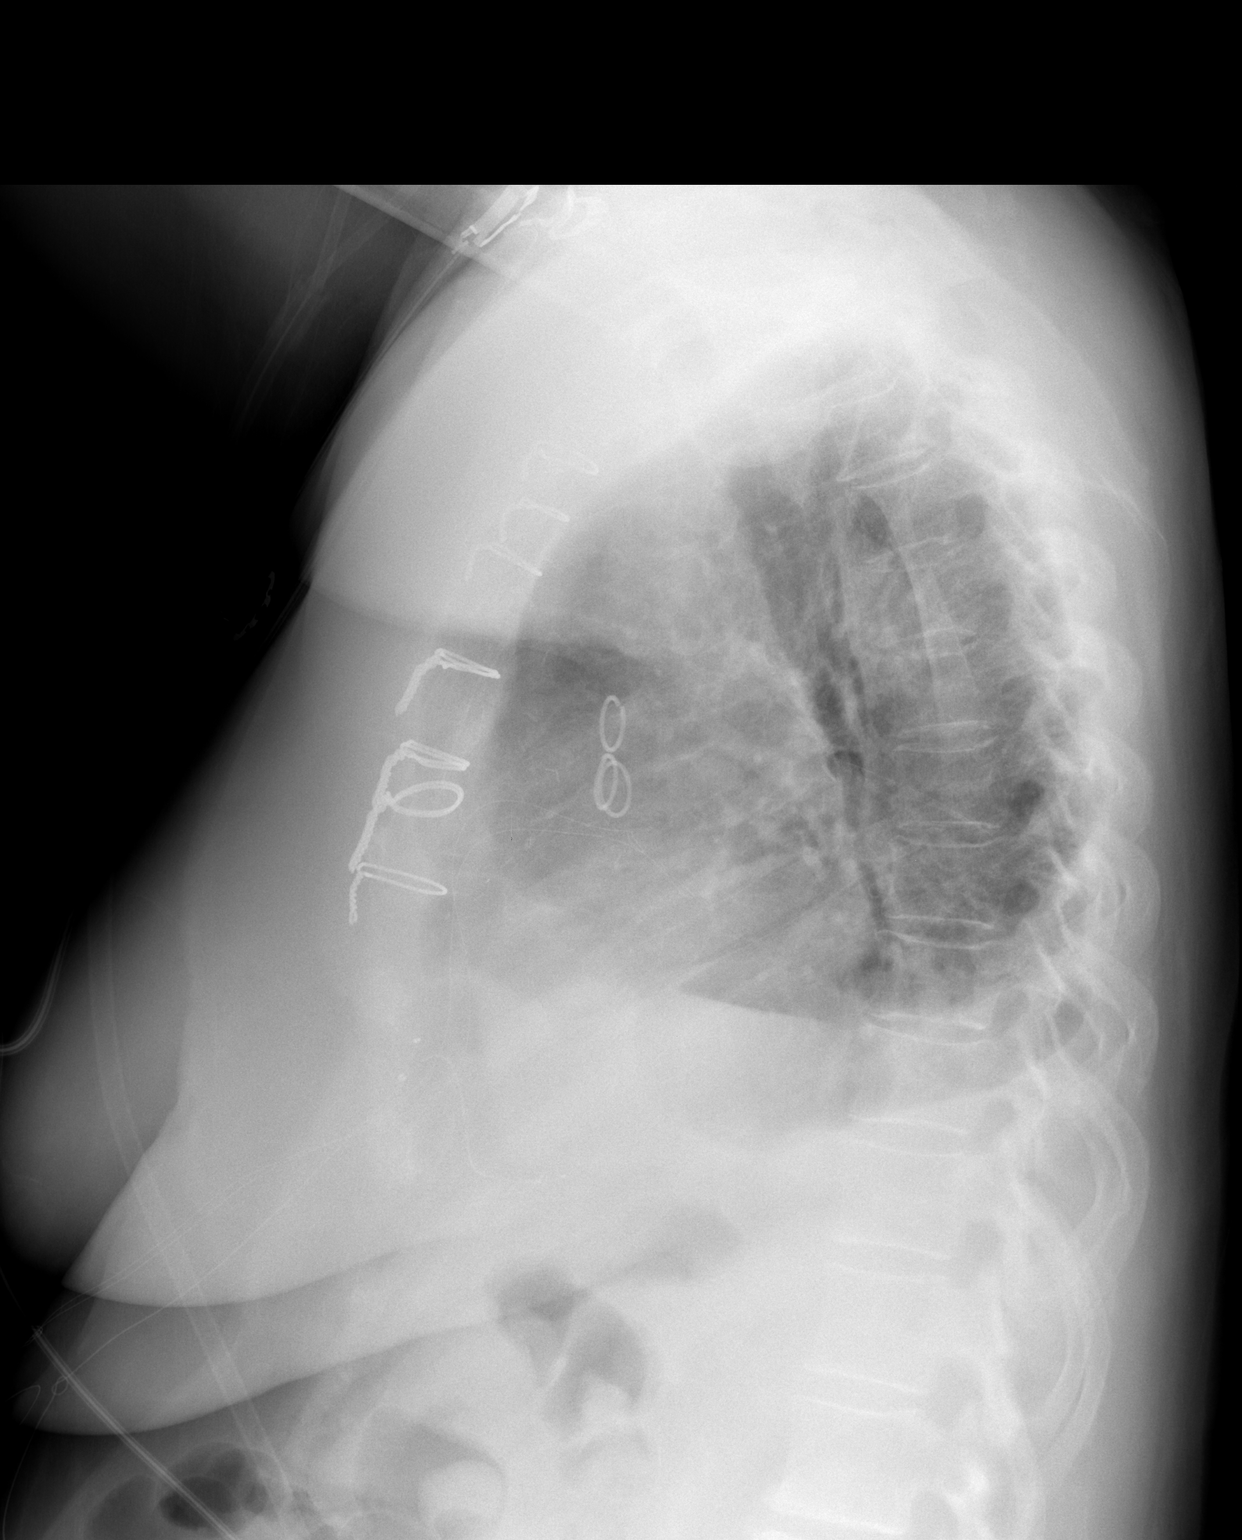

[2 of 2 positions shown; findings below may reference images not displayed]

FINDINGS: There are changes of median sternotomy for CABG.  The
left-sided chest tube and right IJ Swan-Ganz catheter have been
removed.  No visible pneumothorax.  Small bilateral pleural
effusions and bibasilar atelectasis are present.  Negative for
edema.  Epicardial pacing leads noted.  No acute bony abnormality.
IMPRESSION: Small bilateral pleural effusions and bibasilar atelectasis. Recent
CABG.

## 2013-11-18 ENCOUNTER — Ambulatory Visit (INDEPENDENT_AMBULATORY_CARE_PROVIDER_SITE_OTHER): Payer: Medicare HMO | Admitting: Cardiology

## 2013-11-18 ENCOUNTER — Encounter: Payer: Self-pay | Admitting: Cardiology

## 2013-11-18 VITALS — BP 124/72 | HR 56 | Ht 60.0 in | Wt 143.0 lb

## 2013-11-18 DIAGNOSIS — I480 Paroxysmal atrial fibrillation: Secondary | ICD-10-CM

## 2013-11-18 DIAGNOSIS — I4891 Unspecified atrial fibrillation: Secondary | ICD-10-CM

## 2013-11-18 DIAGNOSIS — I2510 Atherosclerotic heart disease of native coronary artery without angina pectoris: Secondary | ICD-10-CM

## 2013-11-18 DIAGNOSIS — Z951 Presence of aortocoronary bypass graft: Secondary | ICD-10-CM

## 2013-11-18 DIAGNOSIS — I214 Non-ST elevation (NSTEMI) myocardial infarction: Secondary | ICD-10-CM

## 2013-11-18 DIAGNOSIS — E785 Hyperlipidemia, unspecified: Secondary | ICD-10-CM

## 2013-11-18 DIAGNOSIS — I10 Essential (primary) hypertension: Secondary | ICD-10-CM

## 2013-11-18 NOTE — Patient Instructions (Signed)
PLEASE SEND A COPY OF CHOLESTEROL LEVEL ONCE THEY ARE DONE.  CONTINUE WITH CURRENT MEDICATIONS   Your physician wants you to follow-up in [place].  You will receive a reminder letter in the mail two months in advance. If you don't receive a letter, please call our office to schedule the follow-up appointment.

## 2013-11-19 ENCOUNTER — Encounter: Payer: Self-pay | Admitting: Cardiology

## 2013-11-19 NOTE — Progress Notes (Signed)
PCP: [name], MD  Clinic Note: Chief Complaint  Patient presents with  . Annual Exam    no chest. no swelling. no tiredness but has had some weakness recently.     HPI: Stacy Adams is a 73 y.o. female with a Cardiovascular Problem List below who presents today for annual followup of her CAD. She had a non-STEMI in December 2012 and found to have multivessel CAD --> referred for CABG. I last saw her September 20 14th she was doing quite well at that time but any major complaints.   Interval History: Stacy Adams seems to be doing relatively well overall from a cardiac standpoint. She denies any recurrence of resting or exertional angina or dyspnea. If she overdoes it she will get little short of breath but doesn't really do that much. She says she doesn't exercise as much as she should - had previously been exercising with a walk every day the. She gets an occasional pinching sensation in her chest with certain activities that is consistent with her postop musculoskeletal symptoms. Remainder of her cardiac review of systems is as follows: No PND, orthopnea or edema.  No palpitations, lightheadedness, dizziness, weakness or syncope/near syncope. No TIA/amaurosis fugax symptoms. No melena, hematochezia, hematuria, or epstaxis. No claudication.  Recently restarted on atorvastatin by her PCP in less than 3 months ago and has not noted much in the way of any significant arthralgias or myalgias. She is asking about a supplementation with herbal such as red yeast rice.  Past Medical History  Diagnosis Date  . NSTEMI (non-ST elevated myocardial infarction) Urgent cath 03/25/2011    Multivessel CAD  . CAD (coronary artery disease), native coronary artery 03/25/2011    80-90% LAD after D1 followed by 60-70% consecutive lesions at T2. Circumflex: 95% ostial, RCA 95-99% --> referred for CABG  . S/P CABG x 4 03/26/2011    LIMA-LAD, SVG-Diag, SVG-Cx-OM, SVG-RCA; Intra-Op TEE: EF 60-65%.  Marland Kitchen PAF  (paroxysmal atrial fibrillation) January 2013    Postop A. fib, no recurrence  . HYPERTENSION 12/21/2006  . Hyperlipidemia 12/21/2006  . CAD, multiple vessel 08/24/2007  . Anxiety   . GERD (gastroesophageal reflux disease)   . Osteopenia 12/21/2006    Prior Cardiac Evaluation and Past Surgical History: Past Surgical History  Procedure Laterality Date  . Cardiac catheterization  03/25/2011    EF 60 is 55%.80-90% LAD after D1 followed by 60-70% consecutive lesions at D2. Circumflex: 95% ostial, RCA 95-99% --> referred for CABG  . Coronary artery bypass graft  03/26/2011    Procedure: CORONARY ARTERY BYPASS GRAFTING (CABG);  Surgeon: Grace Isaac, MD;  Location: [facility];  Service: Open Heart Surgery;  Laterality: N/A;  . Abdominal hysterectomy     MEDICATIONS AND ALLERGIES REVIEWED IN EPIC No Change in Social and Family History  ROS: A comprehensive Review of Systems - was performed Review of Systems  Constitutional: Negative for malaise/fatigue.  HENT: Negative for hearing loss.   Respiratory: Negative for cough, hemoptysis, sputum production, shortness of breath and wheezing.   Cardiovascular: Negative.        Per history of present illness  Gastrointestinal: Negative for blood in stool and melena.  Genitourinary: Negative for dysuria, frequency and hematuria.  Musculoskeletal: Negative for myalgias.  Neurological: Negative for dizziness, sensory change, speech change, focal weakness, seizures, loss of consciousness, weakness and headaches.  Psychiatric/Behavioral: Negative for depression. The patient is not nervous/anxious.   All other systems reviewed and are negative.  Wt Readings from Last  3 Encounters:  11/18/13 143 lb (64.864 kg)  11/15/12 142 lb 14.4 oz (64.819 kg)  04/28/11 138 lb (62.596 kg)   PHYSICAL EXAM BP 124/72  Pulse 56  Ht 5' (1.524 m)  Wt 143 lb (64.864 kg)  BMI 27.93 kg/m2 General appearance: alert, cooperative, appears stated age, no distress and  answers questions appropriate. Overweight, but not obese. Well groomed well-nourished.  Neck: no adenopathy, no carotid bruit, no JVD, supple, symmetrical, trachea midline and thyroid not enlarged, symmetric, no tenderness/mass/nodules  Lungs: clear to auscultation bilaterally, normal percussion bilaterally and Nonlabored, good air movement. No W./R./R.  Heart: regular rate and rhythm, S1, S2 normal, no murmur, click, rub or gallop and normal apical impulse  Abdomen: soft, non-tender; bowel sounds normal; no masses, no organomegaly  Extremities: extremities normal, atraumatic, no cyanosis or edema, no edema, redness or tenderness in the calves or thighs and no ulcers, gangrene or trophic changes  Pulses: 2+ and symmetric  Neurologic: Grossly normal  HEENT: /AT, EOMI, MMM, anicteric sclera   Adult ECG Report  Rate: 57 ;  Rhythm: sinus bradycardia with RBBB; stable EKG  Recent Labs ordered by PCP. Not available:   ASSESSMENT / PLAN: CAD, multiple vessel No active anginal symptoms following her CABG she seems to be doing quite well on her current medication regimen of beta blocker and ACE inhibitor.  She is on an aspirin and now recently back on statin. No change current management  S/P CABG x 4: CORONARY ARTERY BYPASS GRAFTING (CABG) X 4 (LIMA TO LAD,SVG to Diagonal,SVG to distal Circumflex, SVG to PDA She seems to be tolerating her statin relatively well and is otherwise on a good regimen no angina heart failure. Would consider ordering a surveillance Myoview stress test at roughly 4 years post if she has no symptoms in the intervening time.  Essential hypertension Well-controlled on beta blocker plus on usual dose of ACE inhibitor/HCTZ  Hyperlipidemia with target LDL less than 70 She stopped taking Pravachol that was started last year, and complained of intolerance to Crestor, both of which are known to have the least side effect profile. Interestingly, she is now on Lipitor at a  moderate to high dose, and is not having symptoms. I will be curious to see what her repeat labs look like after being back on statin. Hopefully also her plan is to get back into exercising and wants to lose back some of the weight that she gained postoperatively.  PAF (paroxysmal atrial fibrillation) post-CABG We can safely say that she probably does not have paroxysmal A. fib as she has not had any recurrence since her CABG   Well over 15 minutes of the 25 minutes in the patient's room was dedicated to counseling and answering questions about various herbal supplements and other options for treating her cholesterol levels. Also discussed importance of diet and exercise.  No orders of the defined types were placed in this encounter.   Meds ordered this encounter  Medications  . atorvastatin (LIPITOR) 40 MG tablet    Sig: Take 1 tablet by mouth daily.    Followup: 12 months    HARDING,DAVID W, M.D., M.S. Interventional Cardiologist   Pager # [phone]

## 2013-11-19 NOTE — Assessment & Plan Note (Signed)
We can safely say that she probably does not have paroxysmal A. fib as she has not had any recurrence since her CABG

## 2013-11-19 NOTE — Assessment & Plan Note (Signed)
She seems to be tolerating her statin relatively well and is otherwise on a good regimen no angina heart failure. Would consider ordering a surveillance Myoview stress test at roughly 4 years post if she has no symptoms in the intervening time.

## 2013-11-19 NOTE — Assessment & Plan Note (Signed)
Well-controlled on beta blocker plus on usual dose of ACE inhibitor/HCTZ

## 2013-11-19 NOTE — Assessment & Plan Note (Signed)
No active anginal symptoms following her CABG she seems to be doing quite well on her current medication regimen of beta blocker and ACE inhibitor.  She is on an aspirin and now recently back on statin. No change current management

## 2013-11-19 NOTE — Assessment & Plan Note (Signed)
She stopped taking Pravachol that was started last year, and complained of intolerance to Crestor, both of which are known to have the least side effect profile. Interestingly, she is now on Lipitor at a moderate to high dose, and is not having symptoms. I will be curious to see what her repeat labs look like after being back on statin. Hopefully also her plan is to get back into exercising and wants to lose back some of the weight that she gained postoperatively.

## 2013-12-09 ENCOUNTER — Encounter: Payer: Self-pay | Admitting: Cardiology

## 2014-03-06 ENCOUNTER — Encounter (HOSPITAL_COMMUNITY): Payer: Self-pay | Admitting: Cardiology

## 2014-10-28 ENCOUNTER — Encounter: Payer: Self-pay | Admitting: Cardiology

## 2014-11-21 ENCOUNTER — Ambulatory Visit: Payer: Medicare HMO | Admitting: Cardiology

## 2014-11-27 ENCOUNTER — Ambulatory Visit (INDEPENDENT_AMBULATORY_CARE_PROVIDER_SITE_OTHER): Payer: Medicare HMO | Admitting: Cardiology

## 2014-11-27 VITALS — BP 140/80 | HR 57 | Ht 60.0 in | Wt 152.0 lb

## 2014-11-27 DIAGNOSIS — E785 Hyperlipidemia, unspecified: Secondary | ICD-10-CM | POA: Diagnosis not present

## 2014-11-27 DIAGNOSIS — I2510 Atherosclerotic heart disease of native coronary artery without angina pectoris: Secondary | ICD-10-CM

## 2014-11-27 DIAGNOSIS — I214 Non-ST elevation (NSTEMI) myocardial infarction: Secondary | ICD-10-CM | POA: Diagnosis not present

## 2014-11-27 DIAGNOSIS — I10 Essential (primary) hypertension: Secondary | ICD-10-CM

## 2014-11-27 DIAGNOSIS — Z951 Presence of aortocoronary bypass graft: Secondary | ICD-10-CM

## 2014-11-27 NOTE — Patient Instructions (Signed)
NO CHANGE IN CURRENT MEDICATIONS   Your physician wants you to follow-up in [place]. You will receive a reminder letter in the mail two months in advance. If you don't receive a letter, please call our office to schedule the follow-up appointment.

## 2014-11-27 NOTE — Progress Notes (Signed)
PCP: [name], MD  Clinic Note: Chief Complaint  Patient presents with  . Annual Exam    pt c/o dizziness when she lays down/pt states no other Sx.  . Coronary Artery Disease    HPI: Stacy Adams is a 74 y.o. female with a PMH below who presents today for annual followup of her CAD status post CABG - in late December 2012.  Stacy Adams was last seen on 11/18/2013 and  Recent Hospitalizations: n/a  Studies Reviewed: no new studies  Interval History: Stacy Adams is doing pretty well today without any new complaints. She has not had any resting or exertional chest tightness or pressure. No heart failure symptoms of PND orthopnea or edema. She's been in the chest because her sister recently died as several of her friends. As a result she is being otherwise occupied and not been doing routine exercise. Approximate partly because of the lack of exercise issue  Along with having all the food available during the funeral services. Also notably, she has stopped taking her statin because of concerns of what she is seeing on television. She says that she feels bad and had bad side effects, so she is not to take them. She essentially refused despite several minutes of artery. Apparently her labs were checked prior to CT recently however I do not have any of the results.  She has some mild bruising issues, is currently still taking full dose aspirin.  Remainder of Cardiovascular ROS: no chest pain or dyspnea on exertion positive for - mild swelling @ end of the day, bruising negative for - irregular heartbeat, loss of consciousness, orthopnea, palpitations, paroxysmal nocturnal dyspnea, rapid heart rate, shortness of breath or TIA/amaurosis fugax, near syncope/syncope   Past Medical History  Diagnosis Date  . NSTEMI (non-ST elevated myocardial infarction) Urgent cath 03/25/2011    Multivessel CAD  . CAD (coronary artery disease), native coronary artery 03/25/2011    80-90% LAD after D1  followed by 60-70% consecutive lesions at T2. Circumflex: 95% ostial, RCA 95-99% --> referred for CABG  . S/P CABG x 4 03/26/2011    LIMA-LAD, SVG-Diag, SVG-Cx-OM, SVG-RCA; Intra-Op TEE: EF 60-65%.  Marland Kitchen PAF (paroxysmal atrial fibrillation) January 2013    Postop A. fib, no recurrence  . HYPERTENSION 12/21/2006  . Hyperlipidemia 12/21/2006  . CAD, multiple vessel 08/24/2007  . Anxiety   . GERD (gastroesophageal reflux disease)   . Osteopenia 12/21/2006    Past Surgical History  Procedure Laterality Date  . Cardiac catheterization  03/25/2011    EF 60 is 55%.80-90% LAD after D1 followed by 60-70% consecutive lesions at D2. Circumflex: 95% ostial, RCA 95-99% --> referred for CABG  . Coronary artery bypass graft  03/26/2011    Procedure: CORONARY ARTERY BYPASS GRAFTING (CABG);  Surgeon: Grace Isaac, MD;  Location: [facility];  Service: Open Heart Surgery;  Laterality: N/A;  . Abdominal hysterectomy    . Left heart catheterization with coronary angiogram N/A 03/25/2011    Procedure: LEFT HEART CATHETERIZATION WITH CORONARY ANGIOGRAM;  Surgeon: Leonie Man, MD;  Location: [facility] CATH LAB;  Service: Cardiovascular;  Laterality: N/A;    ROS: A comprehensive was performed. Review of Systems  Constitutional: Negative for weight loss and malaise/fatigue.  HENT: Negative for nosebleeds.   Respiratory: Negative for cough and shortness of breath.   Cardiovascular: Negative for claudication.  Gastrointestinal: Negative for blood in stool and melena.  Genitourinary: Negative for hematuria.  Musculoskeletal: Negative for myalgias.  Neurological: Negative for dizziness.  Endo/Heme/Allergies: Bruises/bleeds easily.  All other systems reviewed and are negative.    Past Surgical History  Procedure Laterality Date  . Cardiac catheterization  03/25/2011    EF 60 is 55%.80-90% LAD after D1 followed by 60-70% consecutive lesions at D2. Circumflex: 95% ostial, RCA 95-99% --> referred for CABG  . Coronary  artery bypass graft  03/26/2011    Procedure: CORONARY ARTERY BYPASS GRAFTING (CABG);  Surgeon: Grace Isaac, MD;  Location: [facility];  Service: Open Heart Surgery;  Laterality: N/A;  . Abdominal hysterectomy    . Left heart catheterization with coronary angiogram N/A 03/25/2011    Procedure: LEFT HEART CATHETERIZATION WITH CORONARY ANGIOGRAM;  Surgeon: Leonie Man, MD;  Location: [facility] CATH LAB;  Service: Cardiovascular;  Laterality: N/A;   Prior to Admission medications   Medication Sig Start Date End Date Taking? Authorizing Provider  aspirin 325 MG tablet Take 325 mg by mouth daily. Taking 1/2   Yes Historical Provider, MD  hydrochlorothiazide (HYDRODIURIL) 25 MG tablet Take 1 tablet by mouth daily. 10/16/12  Yes Historical Provider, MD  lisinopril (PRINIVIL,ZESTRIL) 30 MG tablet Take 30 mg by mouth daily.   Yes Historical Provider, MD  metoprolol tartrate (LOPRESSOR) 25 MG tablet Take 50 mg by mouth 2 (two) times daily. 03/31/11  Yes Donielle Liston Alba, PA-C  OVER THE COUNTER MEDICATION Take 1 tablet by mouth daily. "Source of Life" multivitamin    Yes Historical Provider, MD  vitamin C (ASCORBIC ACID) 500 MG tablet Take 500 mg by mouth daily.     Yes Historical Provider, MD   Allergies  Allergen Reactions  . Sulfa Antibiotics     Social History   Social History  . Marital Status: Married    Spouse Name: N/A  . Number of Children: N/A  . Years of Education: N/A   Social History Main Topics  . Smoking status: Never Smoker   . Smokeless tobacco: Never Used  . Alcohol Use: No  . Drug Use: No  . Sexual Activity: Yes   Other Topics Concern  . None   Social History Narrative   She is a married, mother of one was 1 grandchild that unfortunately died early. She is exercising anywhere from 15-60 minutes a day. She does not smoke or drink.   Family History  Problem Relation Age of Onset  . Alzheimer's disease Mother   . Alzheimer's disease Father   . Cancer Brother   .  Lupus Brother     Wt Readings from Last 3 Encounters:  11/27/14 152 lb (68.947 kg)  11/18/13 143 lb (64.864 kg)  11/15/12 142 lb 14.4 oz (64.819 kg)    PHYSICAL EXAM BP 140/80 mmHg  Pulse 57  Ht 5' (1.524 m)  Wt 152 lb (68.947 kg)  BMI 29.69 kg/m2  @ PCP 130/70 mHg General appearance: alert, cooperative, appears stated age, no distress and answers questions appropriate. Overweight, but not obese. Well groomed well-nourished.  HEENT: [place]/AT, EOMI, MMM, anicteric sclera Neck: no adenopathy, no carotid bruit, no JVD, supple, symmetrical, trachea midline and thyroid not enlarged, symmetric, no tenderness/mass/nodules  Lungs: clear to auscultation bilaterally, normal percussion bilaterally and Nonlabored, good air movement. No W./R./R.  Heart: regular rate and rhythm, S1, S2 normal, no murmur, click, rub or gallop and normal apical impulse  Abdomen: soft, non-tender; bowel sounds normal; no masses, no organomegaly  Extremities: extremities normal, atraumatic, no cyanosis or edema, no edema, redness or tenderness in the calves or thighs and no ulcers, gangrene  or trophic changes  Pulses: 2+ and symmetric  Neurologic: Grossly normal    Adult ECG Report  Rate: 57 ;  Rhythm: sinus bradycardia and RBBB, ~ LVH, Lateral T wave abnormalities (new, but non-specific);   Narrative Interpretation: Otherwise stable EKG   Other studies Reviewed: Additional studies/ records that were reviewed today include:  Recent Labs:  None available from PCP    ASSESSMENT / PLAN: Problem List Items Addressed This Visit    CAD, multiple vessel (Chronic)    No anginal symptoms since discharge. His operative risk from a cardiac standpoint with ACE inhibitor beta blocker that she is tolerating well. She also uses a when necessary Lasix but intermittently. Unfortunately she is no longer taking her statin. This department she is also having with her PCP. Since I don't have labs to go by I not sure what to  tell her sides continued exercise and try to lose the weight she gained.      Relevant Orders   EKG 12-Lead (Completed)   Essential hypertension (Chronic)    Borderline blood pressure today. She says her PCPs office they're much better than this. He is on increasing doses lisinopril 30 mg along with Lopressor and HCTZ. I would say that her blood pressure continues to creep up, avoid probably consider doing the 40 mg of lisinopril.      Relevant Orders   EKG 12-Lead (Completed)   Hyperlipidemia with target LDL less than 70 - Primary (Chronic)    In the past, she hasclaimed intolerance to Crestor. Now she has refused to take atorvastatin. Basically sighting her concerns over her to start on television about adverse effects of these medications. Ultimately, her willingness to take the medication is not something that I can control. If her lipids are not controlled and we need to consider something else to use. She has a mental block now on statins during the side effects of statins probably more than her potential for adverse cardiac outcome.      Relevant Orders   EKG 12-Lead (Completed)   NSTEMI (non-ST elevated myocardial infarction) Urgent cath (Chronic)    No recurrent anginal symptoms to suggest MRI. No heart failure symptoms. She had preserved ELF by transesophageal echocardiogram perioperatively. She will be due for followup Myoview from CABG roughly this time next year.      S/P CABG x 4: CORONARY ARTERY BYPASS GRAFTING (CABG) X 4 (LIMA TO LAD,SVG to Diagonal,SVG to distal Circumflex, SVG to PDA (Chronic)    Has not had a functional study or stress test since her CABG -- will plan to order Myoview following next visit.         Current medicines are reviewed at length with the patient today. (+/- concerns -- will NOT take a statin The following changes have been made: cannot decide about appropriate Lipid treatment without labs -- for now will defer to PCP, but need to be  persistent -- goal LDL < 70, HDL > 40.    Studies Ordered:   Orders Placed This Encounter  Procedures  . EKG 12-Lead    F/U 1 yr   Leonie Man, M.D., M.S. Interventional Cardiologist   Pager # [phone]

## 2014-11-29 ENCOUNTER — Encounter: Payer: Self-pay | Admitting: Cardiology

## 2014-11-29 NOTE — Assessment & Plan Note (Signed)
No recurrent anginal symptoms to suggest MRI. No heart failure symptoms. She had preserved ELF by transesophageal echocardiogram perioperatively. She will be due for followup Myoview from CABG roughly this time next year.

## 2014-11-29 NOTE — Assessment & Plan Note (Signed)
In the past, she hasclaimed intolerance to Crestor. Now she has refused to take atorvastatin. Basically sighting her concerns over her to start on television about adverse effects of these medications. Ultimately, her willingness to take the medication is not something that I can control. If her lipids are not controlled and we need to consider something else to use. She has a mental block now on statins during the side effects of statins probably more than her potential for adverse cardiac outcome.

## 2014-11-29 NOTE — Assessment & Plan Note (Addendum)
No anginal symptoms since discharge. His operative risk from a cardiac standpoint with ACE inhibitor beta blocker that she is tolerating well. She also uses a when necessary Lasix but intermittently. Unfortunately she is no longer taking her statin. This department she is also having with her PCP. Since I don't have labs to go by I not sure what to tell her sides continued exercise and try to lose the weight she gained.

## 2014-11-29 NOTE — Assessment & Plan Note (Signed)
Has not had a functional study or stress test since her CABG -- will plan to order Myoview following next visit.

## 2014-11-29 NOTE — Assessment & Plan Note (Signed)
Borderline blood pressure today. She says her PCPs office they're much better than this. He is on increasing doses lisinopril 30 mg along with Lopressor and HCTZ. I would say that her blood pressure continues to creep up, avoid probably consider doing the 40 mg of lisinopril.

## 2015-04-08 DIAGNOSIS — E8881 Metabolic syndrome: Secondary | ICD-10-CM | POA: Diagnosis not present

## 2015-04-08 DIAGNOSIS — F419 Anxiety disorder, unspecified: Secondary | ICD-10-CM | POA: Diagnosis not present

## 2015-04-08 DIAGNOSIS — J309 Allergic rhinitis, unspecified: Secondary | ICD-10-CM | POA: Diagnosis not present

## 2015-04-08 DIAGNOSIS — I499 Cardiac arrhythmia, unspecified: Secondary | ICD-10-CM | POA: Diagnosis not present

## 2015-04-08 DIAGNOSIS — Z6831 Body mass index (BMI) 31.0-31.9, adult: Secondary | ICD-10-CM | POA: Diagnosis not present

## 2015-04-08 DIAGNOSIS — R609 Edema, unspecified: Secondary | ICD-10-CM | POA: Diagnosis not present

## 2015-04-08 DIAGNOSIS — I2510 Atherosclerotic heart disease of native coronary artery without angina pectoris: Secondary | ICD-10-CM | POA: Diagnosis not present

## 2015-04-08 DIAGNOSIS — K589 Irritable bowel syndrome without diarrhea: Secondary | ICD-10-CM | POA: Diagnosis not present

## 2015-04-08 DIAGNOSIS — E7800 Pure hypercholesterolemia, unspecified: Secondary | ICD-10-CM | POA: Diagnosis not present

## 2015-04-08 DIAGNOSIS — I10 Essential (primary) hypertension: Secondary | ICD-10-CM | POA: Diagnosis not present

## 2015-04-08 DIAGNOSIS — Z139 Encounter for screening, unspecified: Secondary | ICD-10-CM | POA: Diagnosis not present

## 2015-05-06 ENCOUNTER — Telehealth: Payer: Self-pay | Admitting: Cardiology

## 2015-05-06 NOTE — Telephone Encounter (Signed)
New message       C/o left leg pain.  Pt states there is a knot on her lower lft leg.  The leg is swollen and painful.  She had open heart surgery approx 4 yrs ago.  Should she be concerned about blood clots?  Please call

## 2015-05-06 NOTE — Telephone Encounter (Signed)
Spoke to patient She states leg and knee swollen for few weeks, swelling behind the knee.  hurts when she walks,  She states she had a knot that was there yesterday , but placed compress and it went away.  patient states she has an appointment with a leg specialist tomorrow - Dr Franne Grip? Rn informed her to keep appointment with doctor.- if needed can call for an appointment.

## 2015-05-07 DIAGNOSIS — M1712 Unilateral primary osteoarthritis, left knee: Secondary | ICD-10-CM | POA: Diagnosis not present

## 2015-07-08 DIAGNOSIS — R609 Edema, unspecified: Secondary | ICD-10-CM | POA: Diagnosis not present

## 2015-07-08 DIAGNOSIS — E7800 Pure hypercholesterolemia, unspecified: Secondary | ICD-10-CM | POA: Diagnosis not present

## 2015-07-08 DIAGNOSIS — J208 Acute bronchitis due to other specified organisms: Secondary | ICD-10-CM | POA: Diagnosis not present

## 2015-07-08 DIAGNOSIS — F419 Anxiety disorder, unspecified: Secondary | ICD-10-CM | POA: Diagnosis not present

## 2015-07-08 DIAGNOSIS — I499 Cardiac arrhythmia, unspecified: Secondary | ICD-10-CM | POA: Diagnosis not present

## 2015-07-08 DIAGNOSIS — I2510 Atherosclerotic heart disease of native coronary artery without angina pectoris: Secondary | ICD-10-CM | POA: Diagnosis not present

## 2015-07-08 DIAGNOSIS — J309 Allergic rhinitis, unspecified: Secondary | ICD-10-CM | POA: Diagnosis not present

## 2015-07-08 DIAGNOSIS — I10 Essential (primary) hypertension: Secondary | ICD-10-CM | POA: Diagnosis not present

## 2015-07-08 DIAGNOSIS — E8881 Metabolic syndrome: Secondary | ICD-10-CM | POA: Diagnosis not present

## 2015-07-08 DIAGNOSIS — Z6830 Body mass index (BMI) 30.0-30.9, adult: Secondary | ICD-10-CM | POA: Diagnosis not present

## 2015-07-08 DIAGNOSIS — K589 Irritable bowel syndrome without diarrhea: Secondary | ICD-10-CM | POA: Diagnosis not present

## 2015-10-07 DIAGNOSIS — R609 Edema, unspecified: Secondary | ICD-10-CM | POA: Diagnosis not present

## 2015-10-07 DIAGNOSIS — I499 Cardiac arrhythmia, unspecified: Secondary | ICD-10-CM | POA: Diagnosis not present

## 2015-10-07 DIAGNOSIS — J309 Allergic rhinitis, unspecified: Secondary | ICD-10-CM | POA: Diagnosis not present

## 2015-10-07 DIAGNOSIS — F419 Anxiety disorder, unspecified: Secondary | ICD-10-CM | POA: Diagnosis not present

## 2015-10-07 DIAGNOSIS — I2510 Atherosclerotic heart disease of native coronary artery without angina pectoris: Secondary | ICD-10-CM | POA: Diagnosis not present

## 2015-10-07 DIAGNOSIS — E7800 Pure hypercholesterolemia, unspecified: Secondary | ICD-10-CM | POA: Diagnosis not present

## 2015-10-07 DIAGNOSIS — E8881 Metabolic syndrome: Secondary | ICD-10-CM | POA: Diagnosis not present

## 2015-10-07 DIAGNOSIS — I10 Essential (primary) hypertension: Secondary | ICD-10-CM | POA: Diagnosis not present

## 2015-10-07 DIAGNOSIS — K589 Irritable bowel syndrome without diarrhea: Secondary | ICD-10-CM | POA: Diagnosis not present

## 2015-10-07 DIAGNOSIS — Z6830 Body mass index (BMI) 30.0-30.9, adult: Secondary | ICD-10-CM | POA: Diagnosis not present

## 2015-11-15 DIAGNOSIS — N3090 Cystitis, unspecified without hematuria: Secondary | ICD-10-CM | POA: Diagnosis not present

## 2015-11-15 DIAGNOSIS — R300 Dysuria: Secondary | ICD-10-CM | POA: Diagnosis not present

## 2015-11-17 ENCOUNTER — Telehealth: Payer: Self-pay | Admitting: Cardiology

## 2015-11-17 NOTE — Telephone Encounter (Signed)
Pt needs a note to be excused from jury duty on 12-08-15. Pt needs this asap,please mail to [facility] of [place] [address] [place] [address]. Thank you so much.

## 2015-11-17 NOTE — Telephone Encounter (Signed)
Called patient. She notes if she is 77 years of age or older and there is medical necessity she may be able to be exempt from jury duty. Pt asked when this will be done. I have informed patient the decision would be made by Dr. Ellyn Hack on whether to write excuse letter or not, would have to send him the request. Pt voiced understanding and thanks.

## 2015-11-20 NOTE — Telephone Encounter (Signed)
No medical reason to be excused from jury duty.  Glenetta Hew, MD

## 2015-11-24 NOTE — Telephone Encounter (Signed)
Left msg for patient to call. 

## 2015-12-02 DIAGNOSIS — H35721 Serous detachment of retinal pigment epithelium, right eye: Secondary | ICD-10-CM | POA: Diagnosis not present

## 2015-12-02 DIAGNOSIS — H26492 Other secondary cataract, left eye: Secondary | ICD-10-CM | POA: Diagnosis not present

## 2015-12-02 DIAGNOSIS — Z961 Presence of intraocular lens: Secondary | ICD-10-CM | POA: Diagnosis not present

## 2015-12-02 DIAGNOSIS — H353131 Nonexudative age-related macular degeneration, bilateral, early dry stage: Secondary | ICD-10-CM | POA: Diagnosis not present

## 2015-12-02 DIAGNOSIS — H353132 Nonexudative age-related macular degeneration, bilateral, intermediate dry stage: Secondary | ICD-10-CM | POA: Diagnosis not present

## 2015-12-02 DIAGNOSIS — H59032 Cystoid macular edema following cataract surgery, left eye: Secondary | ICD-10-CM | POA: Diagnosis not present

## 2015-12-02 DIAGNOSIS — I10 Essential (primary) hypertension: Secondary | ICD-10-CM | POA: Diagnosis not present

## 2015-12-02 DIAGNOSIS — H26491 Other secondary cataract, right eye: Secondary | ICD-10-CM | POA: Diagnosis not present

## 2015-12-28 ENCOUNTER — Ambulatory Visit (INDEPENDENT_AMBULATORY_CARE_PROVIDER_SITE_OTHER): Payer: PPO | Admitting: Cardiology

## 2015-12-28 ENCOUNTER — Encounter: Payer: Self-pay | Admitting: Cardiology

## 2015-12-28 VITALS — BP 160/90 | HR 57 | Ht 60.0 in | Wt 148.0 lb

## 2015-12-28 DIAGNOSIS — I214 Non-ST elevation (NSTEMI) myocardial infarction: Secondary | ICD-10-CM | POA: Diagnosis not present

## 2015-12-28 DIAGNOSIS — E785 Hyperlipidemia, unspecified: Secondary | ICD-10-CM

## 2015-12-28 DIAGNOSIS — I2510 Atherosclerotic heart disease of native coronary artery without angina pectoris: Secondary | ICD-10-CM | POA: Diagnosis not present

## 2015-12-28 DIAGNOSIS — I10 Essential (primary) hypertension: Secondary | ICD-10-CM

## 2015-12-28 DIAGNOSIS — Z951 Presence of aortocoronary bypass graft: Secondary | ICD-10-CM

## 2015-12-28 DIAGNOSIS — I83811 Varicose veins of right lower extremities with pain: Secondary | ICD-10-CM

## 2015-12-28 NOTE — Patient Instructions (Addendum)
WILL CONTACT PRIMARY AN OBTAIN LABS, WILL CONTACT YOU ONCE DR HARDING REVIEW.  SCHEDULE AT [place] has requested that you have a lexiscan myoview. For further information please visit [email or web address]. Please follow instruction sheet, as given.   PURCHASE KNEE-HI  SUPPORT ( COMPRESSION) STOCKING -( [place] OR DEPT. STORE.   Your physician wants you to follow-up in: [place] MINYou will receive a reminder letter in the mail two months in advance. If you don't receive a letter, please call our office to schedule the follow-up appointment.  If you need a refill on your cardiac medications before your next appointment, please call your pharmacy.

## 2015-12-28 NOTE — Progress Notes (Signed)
PCP: [name], MD  Clinic Note: Chief Complaint  Patient presents with  . Follow-up    CAD status post CABG  . Hyperlipidemia    "Will not take statin"    HPI: Stacy Adams is a 75 y.o. female with a PMH below who presents today for annual followup of her CAD status post CABG - in late December 2012.She had a brief episode of postoperative atrial fibrillation, but has not had any recurrence.   Stacy Adams was last seen In September 2016. She is doing very well without any major complaints. She noted some mild bruising on aspirin but otherwise was stable.  She has essentially stopped taking statins or side effects.  Recent Hospitalizations: n/a  Studies Reviewed: no new studies  Interval History: Stacy Adams is doing pretty well today without any new complaints. She has not had any resting or exertional chest tightness or pressure. No heart failure symptoms of PND orthopnea.  She does note having some discomfort in her right leg/shin. She had some small varicose veins there which are somewhat tender to palpation really no significant edema however. She really is not very active, but with what she does do she denies any significant symptoms of chest tightness/pressure with rest or exertion. She really hasn't been testing herself with any particular activity see if she has any anginal symptoms. She says her blood pressure at her PCPs office not that long ago was 120/70.  Her PCP has had her taking a trial medication - Bempoic Acid for her hyperlipidemia. She took it for about 23 days, but said that she just was  not going to take a cholesterol medicine. Unfortunately, she has been getting labs checked every 3 months, but I don't have any lab results.  She has some mild bruising issues, is currently still taking full dose aspirin.  Remainder of Cardiovascular ROS: no chest pain or dyspnea on exertion positive for - mild swelling @ end of the day, bruising negative for - irregular  heartbeat, loss of consciousness, orthopnea, palpitations, paroxysmal nocturnal dyspnea, rapid heart rate, shortness of breath or TIA/amaurosis fugax, near syncope/syncope   Past Medical History:  Diagnosis Date  . Anxiety   . CAD (coronary artery disease), native coronary artery 03/25/2011   80-90% LAD after D1 followed by 60-70% consecutive lesions at T2. Circumflex: 95% ostial, RCA 95-99% --> referred for CABG  . CAD, multiple vessel 08/24/2007  . GERD (gastroesophageal reflux disease)   . Hyperlipidemia 12/21/2006  . HYPERTENSION 12/21/2006  . NSTEMI (non-ST elevated myocardial infarction) Urgent cath 03/25/2011   Multivessel CAD  . Osteopenia 12/21/2006  . PAF (paroxysmal atrial fibrillation) [facility]) January 2013   Postop A. fib, no recurrence  . S/P CABG x 4 03/26/2011   LIMA-LAD, SVG-Diag, SVG-Cx-OM, SVG-RCA; Intra-Op TEE: EF 60-65%.    Past Surgical History:  Procedure Laterality Date  . ABDOMINAL HYSTERECTOMY    . CARDIAC CATHETERIZATION  03/25/2011   EF 60 is 55%.80-90% LAD after D1 followed by 60-70% consecutive lesions at D2. Circumflex: 95% ostial, RCA 95-99% --> referred for CABG  . CORONARY ARTERY BYPASS GRAFT  03/26/2011   Procedure: CORONARY ARTERY BYPASS GRAFTING (CABG);  Surgeon: Grace Isaac, MD;  Location: [place];  Service: Open Heart Surgery;  Laterality: N/A;  . LEFT HEART CATHETERIZATION WITH CORONARY ANGIOGRAM N/A 03/25/2011   Procedure: LEFT HEART CATHETERIZATION WITH CORONARY ANGIOGRAM;  Surgeon: Leonie Man, MD;  Location: [facility] CATH LAB;  Service: Cardiovascular;  Laterality: N/A;  ROS: A comprehensive was performed. Review of Systems  Constitutional: Negative for malaise/fatigue and weight loss.  HENT: Negative for nosebleeds.   Respiratory: Negative for cough and shortness of breath.   Cardiovascular: Negative for claudication.  Gastrointestinal: Negative for blood in stool and melena.  Genitourinary: Negative for hematuria.  Musculoskeletal:  Negative for myalgias.       Right shin pain with palpable small varicose vein  Neurological: Negative for dizziness.  Endo/Heme/Allergies: Bruises/bleeds easily.  Psychiatric/Behavioral: Negative for depression and memory loss. The patient is not nervous/anxious and does not have insomnia.   All other systems reviewed and are negative.    Past Surgical History:  Procedure Laterality Date  . ABDOMINAL HYSTERECTOMY    . CARDIAC CATHETERIZATION  03/25/2011   EF 60 is 55%.80-90% LAD after D1 followed by 60-70% consecutive lesions at D2. Circumflex: 95% ostial, RCA 95-99% --> referred for CABG  . CORONARY ARTERY BYPASS GRAFT  03/26/2011   Procedure: CORONARY ARTERY BYPASS GRAFTING (CABG);  Surgeon: Grace Isaac, MD;  Location: [place];  Service: Open Heart Surgery;  Laterality: N/A;  . LEFT HEART CATHETERIZATION WITH CORONARY ANGIOGRAM N/A 03/25/2011   Procedure: LEFT HEART CATHETERIZATION WITH CORONARY ANGIOGRAM;  Surgeon: Leonie Man, MD;  Location: [facility] CATH LAB;  Service: Cardiovascular;  Laterality: N/A;    Prior to Admission medications   Medication Sig Start Date End Date Taking? Authorizing Provider  aspirin 325 MG tablet Take 325 mg by mouth daily. Taking 1/2   Yes Historical Provider, MD  hydrochlorothiazide (HYDRODIURIL) 25 MG tablet Take 1 tablet by mouth daily. 10/16/12  Yes Historical Provider, MD  lisinopril (PRINIVIL,ZESTRIL) 30 MG tablet Take 30 mg by mouth daily.   Yes Historical Provider, MD  metoprolol tartrate (LOPRESSOR) 25 MG tablet Take 50 mg by mouth 2 (two) times daily. 03/31/11  Yes Donielle Liston Alba, PA-C  OVER THE COUNTER MEDICATION Take 1 tablet by mouth daily. "Source of Life" multivitamin    Yes Historical Provider, MD  vitamin C (ASCORBIC ACID) 500 MG tablet Take 500 mg by mouth daily.     Yes Historical Provider, MD   Allergies  Allergen Reactions  . Sulfa Antibiotics Other (See Comments)    Swelling years ago     Social History   Social History   . Marital status: Married    Spouse name: N/A  . Number of children: N/A  . Years of education: N/A   Social History Main Topics  . Smoking status: Never Smoker  . Smokeless tobacco: Never Used  . Alcohol use No  . Drug use: No  . Sexual activity: Yes   Other Topics Concern  . None   Social History Narrative   She is a married, mother of one was 1 grandchild that unfortunately died early. She is exercising anywhere from 15-60 minutes a day. She does not smoke or drink.   Family History  Problem Relation Age of Onset  . Alzheimer's disease Mother   . Alzheimer's disease Father   . Cancer Brother   . Lupus Brother     Wt Readings from Last 3 Encounters:  12/28/15 67.1 kg (148 lb)  11/27/14 68.9 kg (152 lb)  11/18/13 64.9 kg (143 lb)    PHYSICAL EXAM BP (!) 160/90 (BP Location: Right Arm, Patient Position: Sitting, Cuff Size: Normal)   Pulse (!) 57   Ht 5' (1.524 m)   Wt 67.1 kg (148 lb)   SpO2 97%   BMI 28.90 kg/m   @  PCP 130/70 mHg 1-2 weeks ago. General appearance: alert, cooperative, appears stated age, no distress and answers questions appropriate. Overweight, but not obese. Well groomed well-nourished.  HEENT: [place]/AT, EOMI, MMM, anicteric sclera Neck: no adenopathy, no carotid bruit, no JVD, supple, symmetrical, trachea midline and thyroid not enlarged, symmetric, no tenderness/mass/nodules  Lungs: clear to auscultation bilaterally, normal percussion bilaterally and Nonlabored, good air movement. No W./R./R.  Heart: regular rate and rhythm, S1, S2 normal, no murmur, click, rub or gallop and normal apical impulse  Abdomen: soft, non-tender; bowel sounds normal; no masses, no organomegaly  Extremities: extremities normal, atraumatic, no cyanosis or edema, no edema, -- very small palpable varicose vein on the right shin. This is tender to palpation.  Pulses: 2+ and symmetric  Neurologic: Grossly normal     Adult ECG Report  Rate: 57 ;  Rhythm: sinus  bradycardia and RBBB, ~ LVH, Lateral T wave abnormalities (new, but non-specific);   Narrative Interpretation: Otherwise stable EKG  Other studies Reviewed: Additional studies/ records that were reviewed today include:  Recent Labs:  None available from PCP -- need labs from PCP   ASSESSMENT / PLAN: Problem List Items Addressed This Visit    Varicose veins of leg with pain, right    Really doesn't have much venous stasis changes. Just a few small varicose veins. We discussed staying mobile, elevating legs, and support stockings.  I think if she were support stockings/compression stockings while on her feet for long. Time, this should improve. Would prefer not to treat with medications.      S/P CABG x 4: CORONARY ARTERY BYPASS GRAFTING (CABG) X 4 (LIMA TO LAD,SVG to Diagonal,SVG to distal Circumflex, SVG to PDA (Chronic)    Due for Myoview.      Relevant Orders   EKG 12-Lead (Completed)   Myocardial Perfusion Imaging   NSTEMI (non-ST elevated myocardial infarction) Urgent cath (Chronic)    No recurrent anginal symptoms or heart failure symptoms. Preserved ejection fraction by perioperative TEE. She had CABG following MI. Is due now for routine follow-up Myoview stress test.      Hyperlipidemia with target LDL less than 70 (Chronic)    She has claimed to have unbearable symptoms of myalgias with both Crestor and atorvastatin the past. She is on a medication that I have not heard of before, prescribed by her PCP. Unfortunately she is not taking that either.  I need to get her most recent set of labs (apparently were checked within a month). Pending the results of those labs, we'll likely refer her to our [facility].  The patient and her husband indicated they would prefer for [place] to follow-up the lipids in the future. Therefore for future lab testing we will check a lipid panel in the next 3-4 months.      Relevant Orders   EKG 12-Lead (Completed)   Myocardial  Perfusion Imaging   Essential hypertension (Chronic)    Hypertensive today, but she said that her PCP reduced her HCTZ dose to 12/2 mg at last visit. She is taking an unusual dose of lisinopril along with metoprolol. Her heart rate would make it difficult to titrate up her metoprolol. Could consider switching to carvedilol.  We can reassess her blood pressure when she is seen in the lipid clinic.      Relevant Orders   EKG 12-Lead (Completed)   Myocardial Perfusion Imaging   CAD, multiple vessel - Primary (Chronic)    No anginal symptoms since her CABG. But  not very active. Reluctant to take medications. But she is on a decent dose of ACE inhibitor and beta blocker along with aspirin. Refuses to take statins.  Plan: ~ 5 yr post CABG f/u with Treadmill Myoview that can be converted to [place] if necessary.      Relevant Orders   EKG 12-Lead (Completed)   Myocardial Perfusion Imaging    Other Visit Diagnoses   None.     Current medicines are reviewed at length with the patient today. (+/- concerns) -- will NOT take a statin The following changes have been made: cannot decide about appropriate Lipid treatment without labs -- for now will defer to PCP, but need to be persistent -- goal LDL < 70, HDL > 40.    WILL CONTACT PRIMARY AN OBTAIN LABS, WILL CONTACT YOU ONCE DR HARDING REVIEWs - & refer to [place].  SCHEDULE AT [place] has requested that you have a lexiscan myoview. For further information please visit [email or web address]. Please follow instruction sheet, as given.   PURCHASE KNEE-HI  SUPPORT ( COMPRESSION) STOCKING -( [place] OR DEPT. STORE.   Your physician wants you to follow-up in: [place] DR HARDING- 30 MIN   Studies Ordered:   Orders Placed This Encounter  Procedures  . Myocardial Perfusion Imaging  . EKG 12-Lead     Glenetta Hew, M.D., M.S. Interventional Cardiologist   Pager # [phone]

## 2015-12-29 ENCOUNTER — Encounter: Payer: Self-pay | Admitting: Cardiology

## 2015-12-29 DIAGNOSIS — I83811 Varicose veins of right lower extremities with pain: Secondary | ICD-10-CM | POA: Insufficient documentation

## 2015-12-29 NOTE — Assessment & Plan Note (Signed)
Really doesn't have much venous stasis changes. Just a few small varicose veins. We discussed staying mobile, elevating legs, and support stockings.  I think if she were support stockings/compression stockings while on her feet for long. Time, this should improve. Would prefer not to treat with medications.

## 2015-12-29 NOTE — Assessment & Plan Note (Signed)
No recurrent anginal symptoms or heart failure symptoms. Preserved ejection fraction by perioperative TEE. She had CABG following MI. Is due now for routine follow-up Myoview stress test.

## 2015-12-29 NOTE — Assessment & Plan Note (Addendum)
No anginal symptoms since her CABG. But not very active. Reluctant to take medications. But she is on a decent dose of ACE inhibitor and beta blocker along with aspirin. Refuses to take statins.  Plan: ~ 5 yr post CABG f/u with Treadmill Myoview that can be converted to [place] if necessary.

## 2015-12-29 NOTE — Assessment & Plan Note (Signed)
Due for Myoview.

## 2015-12-29 NOTE — Assessment & Plan Note (Signed)
She has claimed to have unbearable symptoms of myalgias with both Crestor and atorvastatin the past. She is on a medication that I have not heard of before, prescribed by her PCP. Unfortunately she is not taking that either.  I need to get her most recent set of labs (apparently were checked within a month). Pending the results of those labs, we'll likely refer her to our [facility].  The patient and her husband indicated they would prefer for [place] to follow-up the lipids in the future. Therefore for future lab testing we will check a lipid panel in the next 3-4 months.

## 2015-12-29 NOTE — Assessment & Plan Note (Addendum)
Hypertensive today, but she said that her PCP reduced her HCTZ dose to 12/2 mg at last visit. She is taking an unusual dose of lisinopril along with metoprolol. Her heart rate would make it difficult to titrate up her metoprolol. Could consider switching to carvedilol.  We can reassess her blood pressure when she is seen in the lipid clinic.

## 2015-12-31 DIAGNOSIS — H35372 Puckering of macula, left eye: Secondary | ICD-10-CM | POA: Diagnosis not present

## 2016-01-06 ENCOUNTER — Telehealth: Payer: Self-pay | Admitting: Cardiology

## 2016-01-06 NOTE — Telephone Encounter (Signed)
Request for surgical clearance:  1. What type of surgery is being performed? Vitrectomy  2. When is this surgery scheduled? 01-12-15 -Need this back today,if not surgery will have to be cancelled  3. Are there any medications that need to be held prior to surgery and how long?Can pt hold her aspirin from today until surgery? She will start aspirin back day after surgery  4. Name of physician performing surgery? Dr Bjorn Pippin   5. What is your office phone and fax number? [phone] and fax is [phone]

## 2016-01-06 NOTE — Telephone Encounter (Signed)
Forward to dr Ellyn Hack

## 2016-01-06 NOTE — Telephone Encounter (Signed)
Called office and spoke with Yancey Flemings he states that the surgery is scheduled for 01-12-16, but he would like pt to stop ASA today or tomorrow for her scheduled surgery, states that he will have her re-satart ASA back the day after surgery 01-12-16. Please call roger back ar the office at number noted, or fax clearance letter to fax number noted.

## 2016-01-07 NOTE — Telephone Encounter (Signed)
Okay to hold aspirin.  Glenetta Hew, MD

## 2016-01-08 ENCOUNTER — Telehealth: Payer: Self-pay | Admitting: Pharmacist Clinician (PhC)/ Clinical Pharmacy Specialist

## 2016-01-08 NOTE — Telephone Encounter (Signed)
ROUTED INFORMATION TO ROGER MITCHELL PA

## 2016-01-08 NOTE — Telephone Encounter (Signed)
-----   Message from Leonie Man, MD sent at 12/29/2015  4:05 PM EDT ----- Erasmo Downer I think this woman would probably benefit from considering possible non-statin therapy. She is just very reluctant to take any medications. I think a pharmacist discussion would be very helpful for her. She may potentially qualify for Repatha/ Praluent. We are trying to get labs from her PCP that were checked recently.  Glenetta Hew, MD

## 2016-01-08 NOTE — Telephone Encounter (Signed)
LMOM

## 2016-01-12 DIAGNOSIS — Z01818 Encounter for other preprocedural examination: Secondary | ICD-10-CM | POA: Diagnosis not present

## 2016-01-12 DIAGNOSIS — H578 Other specified disorders of eye and adnexa: Secondary | ICD-10-CM | POA: Diagnosis not present

## 2016-01-12 DIAGNOSIS — H35372 Puckering of macula, left eye: Secondary | ICD-10-CM | POA: Diagnosis not present

## 2016-01-12 DIAGNOSIS — Z961 Presence of intraocular lens: Secondary | ICD-10-CM | POA: Diagnosis not present

## 2016-01-12 DIAGNOSIS — H35052 Retinal neovascularization, unspecified, left eye: Secondary | ICD-10-CM | POA: Diagnosis not present

## 2016-01-14 ENCOUNTER — Inpatient Hospital Stay (HOSPITAL_COMMUNITY): Admission: RE | Admit: 2016-01-14 | Payer: Self-pay | Source: Ambulatory Visit

## 2016-02-25 DIAGNOSIS — H35372 Puckering of macula, left eye: Secondary | ICD-10-CM | POA: Diagnosis not present

## 2016-03-29 DIAGNOSIS — H26492 Other secondary cataract, left eye: Secondary | ICD-10-CM | POA: Diagnosis not present

## 2016-06-07 DIAGNOSIS — H33052 Total retinal detachment, left eye: Secondary | ICD-10-CM | POA: Diagnosis not present

## 2016-06-07 DIAGNOSIS — H33012 Retinal detachment with single break, left eye: Secondary | ICD-10-CM | POA: Diagnosis not present

## 2016-06-07 DIAGNOSIS — H3342 Traction detachment of retina, left eye: Secondary | ICD-10-CM | POA: Diagnosis not present

## 2016-06-07 DIAGNOSIS — H33002 Unspecified retinal detachment with retinal break, left eye: Secondary | ICD-10-CM | POA: Diagnosis not present

## 2016-07-05 DIAGNOSIS — H33012 Retinal detachment with single break, left eye: Secondary | ICD-10-CM | POA: Diagnosis not present

## 2016-07-08 DIAGNOSIS — J309 Allergic rhinitis, unspecified: Secondary | ICD-10-CM | POA: Diagnosis not present

## 2016-07-08 DIAGNOSIS — K589 Irritable bowel syndrome without diarrhea: Secondary | ICD-10-CM | POA: Diagnosis not present

## 2016-07-08 DIAGNOSIS — Z6832 Body mass index (BMI) 32.0-32.9, adult: Secondary | ICD-10-CM | POA: Diagnosis not present

## 2016-07-08 DIAGNOSIS — R609 Edema, unspecified: Secondary | ICD-10-CM | POA: Diagnosis not present

## 2016-07-08 DIAGNOSIS — I10 Essential (primary) hypertension: Secondary | ICD-10-CM | POA: Diagnosis not present

## 2016-07-08 DIAGNOSIS — E669 Obesity, unspecified: Secondary | ICD-10-CM | POA: Diagnosis not present

## 2016-07-08 DIAGNOSIS — E7800 Pure hypercholesterolemia, unspecified: Secondary | ICD-10-CM | POA: Diagnosis not present

## 2016-07-08 DIAGNOSIS — I499 Cardiac arrhythmia, unspecified: Secondary | ICD-10-CM | POA: Diagnosis not present

## 2016-07-08 DIAGNOSIS — F419 Anxiety disorder, unspecified: Secondary | ICD-10-CM | POA: Diagnosis not present

## 2016-07-08 DIAGNOSIS — I2510 Atherosclerotic heart disease of native coronary artery without angina pectoris: Secondary | ICD-10-CM | POA: Diagnosis not present

## 2016-09-13 DIAGNOSIS — R300 Dysuria: Secondary | ICD-10-CM | POA: Diagnosis not present

## 2016-09-13 DIAGNOSIS — N3001 Acute cystitis with hematuria: Secondary | ICD-10-CM | POA: Diagnosis not present

## 2016-09-30 DIAGNOSIS — E7800 Pure hypercholesterolemia, unspecified: Secondary | ICD-10-CM | POA: Diagnosis not present

## 2016-09-30 DIAGNOSIS — I499 Cardiac arrhythmia, unspecified: Secondary | ICD-10-CM | POA: Diagnosis not present

## 2016-09-30 DIAGNOSIS — M25571 Pain in right ankle and joints of right foot: Secondary | ICD-10-CM | POA: Diagnosis not present

## 2016-09-30 DIAGNOSIS — Z6831 Body mass index (BMI) 31.0-31.9, adult: Secondary | ICD-10-CM | POA: Diagnosis not present

## 2016-09-30 DIAGNOSIS — R609 Edema, unspecified: Secondary | ICD-10-CM | POA: Diagnosis not present

## 2016-09-30 DIAGNOSIS — F419 Anxiety disorder, unspecified: Secondary | ICD-10-CM | POA: Diagnosis not present

## 2016-09-30 DIAGNOSIS — E8881 Metabolic syndrome: Secondary | ICD-10-CM | POA: Diagnosis not present

## 2016-09-30 DIAGNOSIS — J309 Allergic rhinitis, unspecified: Secondary | ICD-10-CM | POA: Diagnosis not present

## 2016-09-30 DIAGNOSIS — I2510 Atherosclerotic heart disease of native coronary artery without angina pectoris: Secondary | ICD-10-CM | POA: Diagnosis not present

## 2016-09-30 DIAGNOSIS — K589 Irritable bowel syndrome without diarrhea: Secondary | ICD-10-CM | POA: Diagnosis not present

## 2016-10-31 DIAGNOSIS — I10 Essential (primary) hypertension: Secondary | ICD-10-CM | POA: Diagnosis not present

## 2016-10-31 DIAGNOSIS — Z139 Encounter for screening, unspecified: Secondary | ICD-10-CM | POA: Diagnosis not present

## 2016-10-31 DIAGNOSIS — Z9181 History of falling: Secondary | ICD-10-CM | POA: Diagnosis not present

## 2016-10-31 DIAGNOSIS — E7800 Pure hypercholesterolemia, unspecified: Secondary | ICD-10-CM | POA: Diagnosis not present

## 2016-10-31 DIAGNOSIS — I2510 Atherosclerotic heart disease of native coronary artery without angina pectoris: Secondary | ICD-10-CM | POA: Diagnosis not present

## 2016-10-31 DIAGNOSIS — F419 Anxiety disorder, unspecified: Secondary | ICD-10-CM | POA: Diagnosis not present

## 2016-10-31 DIAGNOSIS — I499 Cardiac arrhythmia, unspecified: Secondary | ICD-10-CM | POA: Diagnosis not present

## 2016-10-31 DIAGNOSIS — Z6831 Body mass index (BMI) 31.0-31.9, adult: Secondary | ICD-10-CM | POA: Diagnosis not present

## 2016-10-31 DIAGNOSIS — R609 Edema, unspecified: Secondary | ICD-10-CM | POA: Diagnosis not present

## 2016-10-31 DIAGNOSIS — J309 Allergic rhinitis, unspecified: Secondary | ICD-10-CM | POA: Diagnosis not present

## 2016-10-31 DIAGNOSIS — E8881 Metabolic syndrome: Secondary | ICD-10-CM | POA: Diagnosis not present

## 2016-10-31 DIAGNOSIS — K589 Irritable bowel syndrome without diarrhea: Secondary | ICD-10-CM | POA: Diagnosis not present

## 2016-11-10 DIAGNOSIS — J309 Allergic rhinitis, unspecified: Secondary | ICD-10-CM | POA: Diagnosis not present

## 2016-11-10 DIAGNOSIS — Z6832 Body mass index (BMI) 32.0-32.9, adult: Secondary | ICD-10-CM | POA: Diagnosis not present

## 2016-11-10 DIAGNOSIS — R609 Edema, unspecified: Secondary | ICD-10-CM | POA: Diagnosis not present

## 2016-11-10 DIAGNOSIS — F419 Anxiety disorder, unspecified: Secondary | ICD-10-CM | POA: Diagnosis not present

## 2016-11-10 DIAGNOSIS — N390 Urinary tract infection, site not specified: Secondary | ICD-10-CM | POA: Diagnosis not present

## 2016-11-10 DIAGNOSIS — K589 Irritable bowel syndrome without diarrhea: Secondary | ICD-10-CM | POA: Diagnosis not present

## 2016-11-10 DIAGNOSIS — I10 Essential (primary) hypertension: Secondary | ICD-10-CM | POA: Diagnosis not present

## 2016-11-10 DIAGNOSIS — E7800 Pure hypercholesterolemia, unspecified: Secondary | ICD-10-CM | POA: Diagnosis not present

## 2016-11-10 DIAGNOSIS — I2510 Atherosclerotic heart disease of native coronary artery without angina pectoris: Secondary | ICD-10-CM | POA: Diagnosis not present

## 2016-11-10 DIAGNOSIS — I499 Cardiac arrhythmia, unspecified: Secondary | ICD-10-CM | POA: Diagnosis not present

## 2016-11-10 DIAGNOSIS — E8881 Metabolic syndrome: Secondary | ICD-10-CM | POA: Diagnosis not present

## 2016-11-24 DIAGNOSIS — Z6831 Body mass index (BMI) 31.0-31.9, adult: Secondary | ICD-10-CM | POA: Diagnosis not present

## 2016-11-24 DIAGNOSIS — I10 Essential (primary) hypertension: Secondary | ICD-10-CM | POA: Diagnosis not present

## 2016-11-24 DIAGNOSIS — I2510 Atherosclerotic heart disease of native coronary artery without angina pectoris: Secondary | ICD-10-CM | POA: Diagnosis not present

## 2016-11-24 DIAGNOSIS — E7800 Pure hypercholesterolemia, unspecified: Secondary | ICD-10-CM | POA: Diagnosis not present

## 2016-11-24 DIAGNOSIS — J309 Allergic rhinitis, unspecified: Secondary | ICD-10-CM | POA: Diagnosis not present

## 2016-11-24 DIAGNOSIS — E8881 Metabolic syndrome: Secondary | ICD-10-CM | POA: Diagnosis not present

## 2016-11-24 DIAGNOSIS — K589 Irritable bowel syndrome without diarrhea: Secondary | ICD-10-CM | POA: Diagnosis not present

## 2016-11-24 DIAGNOSIS — I499 Cardiac arrhythmia, unspecified: Secondary | ICD-10-CM | POA: Diagnosis not present

## 2016-11-24 DIAGNOSIS — R609 Edema, unspecified: Secondary | ICD-10-CM | POA: Diagnosis not present

## 2016-11-24 DIAGNOSIS — N390 Urinary tract infection, site not specified: Secondary | ICD-10-CM | POA: Diagnosis not present

## 2016-11-24 DIAGNOSIS — F419 Anxiety disorder, unspecified: Secondary | ICD-10-CM | POA: Diagnosis not present

## 2016-11-29 DIAGNOSIS — H43811 Vitreous degeneration, right eye: Secondary | ICD-10-CM | POA: Diagnosis not present

## 2016-11-29 DIAGNOSIS — H26491 Other secondary cataract, right eye: Secondary | ICD-10-CM | POA: Diagnosis not present

## 2016-11-29 DIAGNOSIS — T85398A Other mechanical complication of other ocular prosthetic devices, implants and grafts, initial encounter: Secondary | ICD-10-CM | POA: Diagnosis not present

## 2016-11-29 DIAGNOSIS — H33052 Total retinal detachment, left eye: Secondary | ICD-10-CM | POA: Diagnosis not present

## 2016-12-01 DIAGNOSIS — H33052 Total retinal detachment, left eye: Secondary | ICD-10-CM | POA: Diagnosis not present

## 2016-12-01 DIAGNOSIS — Z961 Presence of intraocular lens: Secondary | ICD-10-CM | POA: Diagnosis not present

## 2016-12-01 DIAGNOSIS — H26491 Other secondary cataract, right eye: Secondary | ICD-10-CM | POA: Diagnosis not present

## 2016-12-01 DIAGNOSIS — H353112 Nonexudative age-related macular degeneration, right eye, intermediate dry stage: Secondary | ICD-10-CM | POA: Diagnosis not present

## 2016-12-23 DIAGNOSIS — I499 Cardiac arrhythmia, unspecified: Secondary | ICD-10-CM | POA: Diagnosis not present

## 2016-12-23 DIAGNOSIS — E8881 Metabolic syndrome: Secondary | ICD-10-CM | POA: Diagnosis not present

## 2016-12-23 DIAGNOSIS — Z6831 Body mass index (BMI) 31.0-31.9, adult: Secondary | ICD-10-CM | POA: Diagnosis not present

## 2016-12-23 DIAGNOSIS — E7800 Pure hypercholesterolemia, unspecified: Secondary | ICD-10-CM | POA: Diagnosis not present

## 2016-12-23 DIAGNOSIS — K589 Irritable bowel syndrome without diarrhea: Secondary | ICD-10-CM | POA: Diagnosis not present

## 2016-12-23 DIAGNOSIS — I10 Essential (primary) hypertension: Secondary | ICD-10-CM | POA: Diagnosis not present

## 2016-12-23 DIAGNOSIS — R609 Edema, unspecified: Secondary | ICD-10-CM | POA: Diagnosis not present

## 2016-12-23 DIAGNOSIS — I2510 Atherosclerotic heart disease of native coronary artery without angina pectoris: Secondary | ICD-10-CM | POA: Diagnosis not present

## 2016-12-23 DIAGNOSIS — N390 Urinary tract infection, site not specified: Secondary | ICD-10-CM | POA: Diagnosis not present

## 2016-12-23 DIAGNOSIS — F419 Anxiety disorder, unspecified: Secondary | ICD-10-CM | POA: Diagnosis not present

## 2016-12-23 DIAGNOSIS — J309 Allergic rhinitis, unspecified: Secondary | ICD-10-CM | POA: Diagnosis not present

## 2017-01-18 DIAGNOSIS — I2510 Atherosclerotic heart disease of native coronary artery without angina pectoris: Secondary | ICD-10-CM | POA: Diagnosis not present

## 2017-01-18 DIAGNOSIS — E782 Mixed hyperlipidemia: Secondary | ICD-10-CM | POA: Diagnosis not present

## 2017-01-18 DIAGNOSIS — I10 Essential (primary) hypertension: Secondary | ICD-10-CM | POA: Diagnosis not present

## 2017-01-30 DIAGNOSIS — E7800 Pure hypercholesterolemia, unspecified: Secondary | ICD-10-CM | POA: Diagnosis not present

## 2017-01-30 DIAGNOSIS — J309 Allergic rhinitis, unspecified: Secondary | ICD-10-CM | POA: Diagnosis not present

## 2017-01-30 DIAGNOSIS — Z1331 Encounter for screening for depression: Secondary | ICD-10-CM | POA: Diagnosis not present

## 2017-01-30 DIAGNOSIS — F419 Anxiety disorder, unspecified: Secondary | ICD-10-CM | POA: Diagnosis not present

## 2017-01-30 DIAGNOSIS — Z6831 Body mass index (BMI) 31.0-31.9, adult: Secondary | ICD-10-CM | POA: Diagnosis not present

## 2017-01-30 DIAGNOSIS — E8881 Metabolic syndrome: Secondary | ICD-10-CM | POA: Diagnosis not present

## 2017-01-30 DIAGNOSIS — I10 Essential (primary) hypertension: Secondary | ICD-10-CM | POA: Diagnosis not present

## 2017-01-30 DIAGNOSIS — I499 Cardiac arrhythmia, unspecified: Secondary | ICD-10-CM | POA: Diagnosis not present

## 2017-01-30 DIAGNOSIS — K589 Irritable bowel syndrome without diarrhea: Secondary | ICD-10-CM | POA: Diagnosis not present

## 2017-01-30 DIAGNOSIS — R609 Edema, unspecified: Secondary | ICD-10-CM | POA: Diagnosis not present

## 2017-01-30 DIAGNOSIS — I2510 Atherosclerotic heart disease of native coronary artery without angina pectoris: Secondary | ICD-10-CM | POA: Diagnosis not present

## 2017-02-20 DIAGNOSIS — N3000 Acute cystitis without hematuria: Secondary | ICD-10-CM | POA: Diagnosis not present

## 2017-02-20 DIAGNOSIS — N3020 Other chronic cystitis without hematuria: Secondary | ICD-10-CM | POA: Diagnosis not present

## 2017-03-27 DIAGNOSIS — I160 Hypertensive urgency: Secondary | ICD-10-CM | POA: Diagnosis not present

## 2017-04-24 DIAGNOSIS — J069 Acute upper respiratory infection, unspecified: Secondary | ICD-10-CM | POA: Diagnosis not present

## 2017-06-26 DIAGNOSIS — J0100 Acute maxillary sinusitis, unspecified: Secondary | ICD-10-CM | POA: Diagnosis not present

## 2017-12-04 DIAGNOSIS — H524 Presbyopia: Secondary | ICD-10-CM | POA: Diagnosis not present

## 2017-12-04 DIAGNOSIS — Z961 Presence of intraocular lens: Secondary | ICD-10-CM | POA: Diagnosis not present

## 2017-12-05 DIAGNOSIS — J028 Acute pharyngitis due to other specified organisms: Secondary | ICD-10-CM | POA: Diagnosis not present

## 2017-12-05 DIAGNOSIS — M542 Cervicalgia: Secondary | ICD-10-CM | POA: Diagnosis not present

## 2017-12-25 DIAGNOSIS — J0100 Acute maxillary sinusitis, unspecified: Secondary | ICD-10-CM | POA: Diagnosis not present

## 2017-12-25 DIAGNOSIS — R51 Headache: Secondary | ICD-10-CM | POA: Diagnosis not present

## 2018-01-03 DIAGNOSIS — J0100 Acute maxillary sinusitis, unspecified: Secondary | ICD-10-CM | POA: Diagnosis not present

## 2018-01-03 DIAGNOSIS — M542 Cervicalgia: Secondary | ICD-10-CM | POA: Diagnosis not present

## 2018-01-17 ENCOUNTER — Other Ambulatory Visit: Payer: Self-pay

## 2018-01-17 ENCOUNTER — Emergency Department (HOSPITAL_COMMUNITY)
Admission: EM | Admit: 2018-01-17 | Discharge: 2018-01-17 | Discharge status: Against Medical Advice | Payer: PPO | Attending: Emergency Medicine | Admitting: Emergency Medicine

## 2018-01-17 ENCOUNTER — Encounter (HOSPITAL_COMMUNITY): Payer: Self-pay | Admitting: *Deleted

## 2018-01-17 DIAGNOSIS — H9201 Otalgia, right ear: Secondary | ICD-10-CM | POA: Diagnosis not present

## 2018-01-17 DIAGNOSIS — Z5321 Procedure and treatment not carried out due to patient leaving prior to being seen by health care provider: Secondary | ICD-10-CM | POA: Diagnosis not present

## 2018-01-17 NOTE — ED Notes (Signed)
Pt came to desk stating that she didn't want to wait any longer. Informed pt that there was 6 people ahead of her and that there were currently 3 rooms coming open. Encouraged pt to stay due to her symptoms no being resolved pt still wanted to leave.

## 2018-01-17 NOTE — ED Triage Notes (Signed)
Pt in c/o right earache and right facial pain that has been going on for a month, been to her PCP and treated for sinus infection but states symptoms have not resolved

## 2018-01-25 ENCOUNTER — Emergency Department (HOSPITAL_COMMUNITY): Payer: PPO

## 2018-01-25 ENCOUNTER — Encounter (HOSPITAL_COMMUNITY): Payer: Self-pay | Admitting: Emergency Medicine

## 2018-01-25 ENCOUNTER — Emergency Department (HOSPITAL_COMMUNITY)
Admission: EM | Admit: 2018-01-25 | Discharge: 2018-01-25 | Disposition: A | Discharge status: Home / Self Care | Payer: PPO | Attending: Emergency Medicine | Admitting: Emergency Medicine

## 2018-01-25 ENCOUNTER — Other Ambulatory Visit: Payer: Self-pay

## 2018-01-25 DIAGNOSIS — I10 Essential (primary) hypertension: Secondary | ICD-10-CM | POA: Insufficient documentation

## 2018-01-25 DIAGNOSIS — I6529 Occlusion and stenosis of unspecified carotid artery: Secondary | ICD-10-CM | POA: Diagnosis not present

## 2018-01-25 DIAGNOSIS — I2510 Atherosclerotic heart disease of native coronary artery without angina pectoris: Secondary | ICD-10-CM | POA: Diagnosis not present

## 2018-01-25 DIAGNOSIS — I252 Old myocardial infarction: Secondary | ICD-10-CM | POA: Diagnosis not present

## 2018-01-25 DIAGNOSIS — Z79899 Other long term (current) drug therapy: Secondary | ICD-10-CM | POA: Diagnosis not present

## 2018-01-25 DIAGNOSIS — R519 Headache, unspecified: Secondary | ICD-10-CM

## 2018-01-25 DIAGNOSIS — Z7982 Long term (current) use of aspirin: Secondary | ICD-10-CM | POA: Insufficient documentation

## 2018-01-25 DIAGNOSIS — Z951 Presence of aortocoronary bypass graft: Secondary | ICD-10-CM | POA: Diagnosis not present

## 2018-01-25 DIAGNOSIS — R51 Headache: Secondary | ICD-10-CM | POA: Insufficient documentation

## 2018-01-25 LAB — BASIC METABOLIC PANEL
Anion gap: 9 (ref 5–15)
BUN: 21 mg/dL (ref 8–23)
CALCIUM: 9.8 mg/dL (ref 8.9–10.3)
CHLORIDE: 104 mmol/L (ref 98–111)
CO2: 28 mmol/L (ref 22–32)
CREATININE: 0.96 mg/dL (ref 0.44–1.00)
GFR calc Af Amer: >60 mL/min (ref >60)
GFR calc non Af Amer: 55 mL/min — ABNORMAL LOW (ref >60)
Glucose, Bld: 105 mg/dL — ABNORMAL HIGH (ref 70–99)
Potassium: 3.5 mmol/L (ref 3.5–5.1)
Sodium: 141 mmol/L (ref 135–145)

## 2018-01-25 LAB — CBC WITH DIFFERENTIAL/PLATELET
Abs Immature Granulocytes: 0.05 10*3/uL (ref 0.00–0.07)
Basophils Absolute: 0.1 10*3/uL (ref 0.0–0.1)
Basophils Relative: 1 %
EOS ABS: 0.3 10*3/uL (ref 0.0–0.5)
EOS PCT: 3 %
HEMATOCRIT: 45 % (ref 36.0–46.0)
Hemoglobin: 14.5 g/dL (ref 12.0–15.0)
Immature Granulocytes: 1 %
LYMPHS ABS: 1.1 10*3/uL (ref 0.7–4.0)
LYMPHS PCT: 10 %
MCH: 28.9 pg (ref 26.0–34.0)
MCHC: 32.2 g/dL (ref 30.0–36.0)
MCV: 89.8 fL (ref 80.0–100.0)
MONO ABS: 0.6 10*3/uL (ref 0.1–1.0)
Monocytes Relative: 6 %
Neutro Abs: 8.4 10*3/uL — ABNORMAL HIGH (ref 1.7–7.7)
Neutrophils Relative %: 79 %
Platelets: 212 10*3/uL (ref 150–400)
RBC: 5.01 MIL/uL (ref 3.87–5.11)
RDW: 13.5 % (ref 11.5–15.5)
WBC: 10.4 10*3/uL (ref 4.0–10.5)
nRBC: 0 % (ref 0.0–0.2)

## 2018-01-25 LAB — SEDIMENTATION RATE: Sed Rate: 18 mm/hr (ref 0–22)

## 2018-01-25 MED ORDER — METOPROLOL TARTRATE 25 MG PO TABS
50.0000 mg | ORAL_TABLET | Freq: Once | ORAL | Status: AC
  Administered 2018-01-25: 50 mg via ORAL
  Filled 2018-01-25: qty 2

## 2018-01-25 MED ORDER — IOPAMIDOL (ISOVUE-370) INJECTION 76%
INTRAVENOUS | Status: AC
  Filled 2018-01-25: qty 50

## 2018-01-25 MED ORDER — CLONIDINE HCL 0.1 MG PO TABS
0.1000 mg | ORAL_TABLET | Freq: Once | ORAL | Status: AC
  Administered 2018-01-25: 0.1 mg via ORAL
  Filled 2018-01-25: qty 1

## 2018-01-25 MED ORDER — IOPAMIDOL (ISOVUE-370) INJECTION 76%
50.0000 mL | Freq: Once | INTRAVENOUS | Status: AC | PRN
  Administered 2018-01-25: 50 mL via INTRAVENOUS

## 2018-01-25 NOTE — ED Triage Notes (Addendum)
Pt intermittent headache for the last month. Pt denies pain at this time. Pt reports 10/10 tearing sensation when headache is there. Pt came from urgent care, they were concerned about a brain aneurysm. Pt has attempted some medications with little relief - Tylenol, muscle relaxers, antibiotics. Pt reports hx of htn and is compliant with medications. No neuro sx identified in triage.

## 2018-01-25 NOTE — ED Provider Notes (Signed)
[facility] EMERGENCY DEPARTMENT Provider Note   CSN: [id number] Arrival date & time: 01/25/18  1546     History   Chief Complaint Chief Complaint  Patient presents with  . Headache    HPI Stacy Adams is a 77 y.o. female.  He was referred here from urgent care for a posterior occipital headache that is been going on for a month.  It sounds like the headache is comes and goes.  She says it is worse when she is under stress.  She has been treated for a sinus infection and that is helped some of her symptoms.  She usually takes Tylenol for the headache.  She says her blood pressure is often elevated in the doctor's office but goes back down to normal when she is not here and nervous.  Headache is not associated with any blurry vision double vision nausea vomiting numbness or weakness.  She endorses no chest pain or shortness of breath.  She has not taken her evening blood pressure medicine.  Currently she rates her headache is a 0 out of 10 but earlier it was 10 out of 10.  The history is provided by the patient.  Headache   This is a new problem. Episode onset: 1 month. Episode frequency: every few days. The problem has been resolved. The headache is associated with emotional stress. The pain is located in the occipital region. Quality: tearing. The pain is at a severity of 10/10. The pain does not radiate. Pertinent negatives include no fever, no chest pressure, no palpitations, no syncope, no shortness of breath, no nausea and no vomiting. She has tried acetaminophen for the symptoms. The treatment provided mild relief.    Past Medical History:  Diagnosis Date  . Anxiety   . CAD (coronary artery disease), native coronary artery 03/25/2011   80-90% LAD after D1 followed by 60-70% consecutive lesions at T2. Circumflex: 95% ostial, RCA 95-99% --> referred for CABG  . CAD, multiple vessel 08/24/2007  . GERD (gastroesophageal reflux disease)   . Hyperlipidemia  12/21/2006  . HYPERTENSION 12/21/2006  . NSTEMI (non-ST elevated myocardial infarction) Urgent cath 03/25/2011   Multivessel CAD  . Osteopenia 12/21/2006  . PAF (paroxysmal atrial fibrillation) [facility] January 2013   Postop A. fib, no recurrence  . S/P CABG x 4 03/26/2011   LIMA-LAD, SVG-Diag, SVG-Cx-OM, SVG-RCA; Intra-Op TEE: EF 60-65%.    Patient Active Problem List   Diagnosis Date Noted  . Varicose veins of leg with pain, right 12/29/2015  . PAF (paroxysmal atrial fibrillation) post-CABG 04/02/2011  . S/P CABG x 4: CORONARY ARTERY BYPASS GRAFTING (CABG) X 4 (LIMA TO LAD,SVG to Diagonal,SVG to distal Circumflex, SVG to PDA 03/30/2011  . NSTEMI (non-ST elevated myocardial infarction) Urgent cath 03/25/2011    Class: Hospitalized for  . CAD, multiple vessel 03/25/2011  . ANXIETY 08/24/2007  . Hyperlipidemia with target LDL less than 70 12/21/2006  . Essential hypertension 12/21/2006  . GERD 12/21/2006  . OSTEOPENIA 12/21/2006    Past Surgical History:  Procedure Laterality Date  . ABDOMINAL HYSTERECTOMY    . CARDIAC CATHETERIZATION  03/25/2011   EF 60 is 55%.80-90% LAD after D1 followed by 60-70% consecutive lesions at D2. Circumflex: 95% ostial, RCA 95-99% --> referred for CABG  . CORONARY ARTERY BYPASS GRAFT  03/26/2011   Procedure: CORONARY ARTERY BYPASS GRAFTING (CABG);  Surgeon: Grace Isaac, MD;  Location: [facility];  Service: Open Heart Surgery;  Laterality: N/A;  . LEFT HEART CATHETERIZATION  WITH CORONARY ANGIOGRAM N/A 03/25/2011   Procedure: LEFT HEART CATHETERIZATION WITH CORONARY ANGIOGRAM;  Surgeon: Leonie Man, MD;  Location[facility] CATH LAB;  Service: Cardiovascular;  Laterality: N/A;     OB History   None      Home Medications    Prior to Admission medications   Medication Sig Start Date End Date Taking? Authorizing Provider  aspirin 325 MG tablet Take 325 mg by mouth daily. Taking 1/2    [provider]  hydrochlorothiazide (HYDRODIURIL) 25 MG  tablet Take 1 tablet by mouth daily. 10/16/12   [provider]  lisinopril (PRINIVIL,ZESTRIL) 30 MG tablet Take 30 mg by mouth daily.    [provider]  metoprolol tartrate (LOPRESSOR) 25 MG tablet Take 50 mg by mouth 2 (two) times daily. 03/31/11   Lars Pinks M, PA-C  OVER THE COUNTER MEDICATION Take 1 tablet by mouth daily. "Source of Life" multivitamin     [provider]  vitamin C (ASCORBIC ACID) 500 MG tablet Take 500 mg by mouth daily.      [provider]    Family History Family History  Problem Relation Age of Onset  . Alzheimer's disease Mother   . Alzheimer's disease Father   . Cancer Brother   . Lupus Brother     Social History Social History   Tobacco Use  . Smoking status: Never Smoker  . Smokeless tobacco: Never Used  Substance Use Topics  . Alcohol use: No  . Drug use: No     Allergies   Sulfa antibiotics   Review of Systems Review of Systems  Constitutional: Negative for fever.  HENT: Negative for sore throat.   Eyes: Negative for visual disturbance.  Respiratory: Negative for shortness of breath.   Cardiovascular: Negative for chest pain, palpitations and syncope.  Gastrointestinal: Negative for abdominal pain, nausea and vomiting.  Genitourinary: Negative for dysuria.  Musculoskeletal: Positive for neck pain (earlier this month, resolved).  Skin: Negative for rash.  Neurological: Positive for headaches. Negative for seizures, syncope, speech difficulty, weakness and numbness.     Physical Exam Updated Vital Signs BP (S) (!) 208/88 (BP Location: Right Arm)   Pulse 63   Temp 98.2 F (36.8 C) (Oral)   Resp 20   Ht 5' (1.524 m)   Wt 67.6 kg   SpO2 99%   BMI 29.10 kg/m   Physical Exam  Constitutional: She is oriented to person, place, and time. She appears well-developed and well-nourished. No distress.  HENT:  Head: Normocephalic and atraumatic.  Mouth/Throat: Oropharynx is clear and moist.    Eyes: Pupils are equal, round, and reactive to light. Conjunctivae and EOM are normal.  Neck: Neck supple.  Cardiovascular: Normal rate and regular rhythm.  No murmur heard. Pulmonary/Chest: Effort normal and breath sounds normal. No respiratory distress.  Abdominal: Soft. There is no tenderness.  Musculoskeletal: She exhibits no edema.  Neurological: She is alert and oriented to person, place, and time. She has normal strength. No cranial nerve deficit or sensory deficit. GCS eye subscore is 4. GCS verbal subscore is 5. GCS motor subscore is 6.  Skin: Skin is warm and dry. Capillary refill takes less than 2 seconds.  Psychiatric: She has a normal mood and affect.  Nursing note and vitals reviewed.    ED Treatments / Results  Labs (all labs ordered are listed, but only abnormal results are displayed) Labs Reviewed  BASIC METABOLIC PANEL - Abnormal; Notable for the following components:  Result Value   Glucose, Bld 105 (*)    GFR calc non Af Amer 55 (*)    All other components within normal limits  CBC WITH DIFFERENTIAL/PLATELET - Abnormal; Notable for the following components:   Neutro Abs 8.4 (*)    All other components within normal limits  SEDIMENTATION RATE    EKG None  Radiology Ct Angio Head W/cm &/or Wo Cm  Result Date: 01/25/2018 CLINICAL DATA:  Intermittent headaches for 1 month. EXAM: CT ANGIOGRAPHY HEAD TECHNIQUE: Multidetector CT imaging of the head was performed using the standard protocol during bolus administration of intravenous contrast. Multiplanar CT image reconstructions and MIPs were obtained to evaluate the vascular anatomy. CONTRAST:  91mL ISOVUE-370 IOPAMIDOL (ISOVUE-370) INJECTION 76% COMPARISON:  Head CT 01/25/2018 FINDINGS: CTA HEAD FINDINGS ANTERIOR CIRCULATION: --Intracranial internal carotid arteries: Atherosclerotic calcification of the internal carotid arteries at the skull base without hemodynamically significant stenosis. --Anterior cerebral  arteries: Normal. Both A1 segments are present. Patent anterior communicating artery. --Middle cerebral arteries: Normal. --Posterior communicating arteries: Present on the right, absent on the left. POSTERIOR CIRCULATION: --Basilar artery: Normal. --Posterior cerebral arteries: Normal. --Superior cerebellar arteries: Normal. --Inferior cerebellar arteries: Normal posterior inferior cerebellar arteries. Anterior inferior cerebellar arteries are not clearly visible, but this is not uncommon. VENOUS SINUSES: As permitted by contrast timing, patent. ANATOMIC VARIANTS: None DELAYED PHASE: No parenchymal contrast enhancement. Review of the MIP images confirms the above findings. IMPRESSION: No intracranial occlusion or hemodynamically significant stenosis. No aneurysm. Electronically Signed   By: Ulyses Jarred M.D.   On: 01/25/2018 20:54   Ct Head Wo Contrast  Result Date: 01/25/2018 CLINICAL DATA:  Intermittent headache for the last month, severe pain with headaches. Worst headache of life. Concern for brain aneurysm. EXAM: CT HEAD WITHOUT CONTRAST TECHNIQUE: Contiguous axial images were obtained from the base of the skull through the vertex without intravenous contrast. COMPARISON:  None. FINDINGS: Brain: Generalized age related parenchymal atrophy with commensurate dilatation of the ventricles and sulci. Chronic small vessel ischemic changes noted within the bilateral periventricular and subcortical white matter regions. No mass, hemorrhage, edema or other evidence of acute parenchymal abnormality. No extra-axial hemorrhage. Vascular: Chronic calcified atherosclerotic changes of the large vessels at the skull base. No unexpected hyperdense vessel. No evidence of aneurysm seen. Skull: Normal. Negative for fracture or focal lesion. Sinuses/Orbits: Hyperdense material within the LEFT orbital globe, with associated irregularity of the margins of the orbital globe, presumably chronic. RIGHT orbital globe appears  normal. Paranasal sinuses are clear. Other: None. IMPRESSION: 1. No acute intracranial abnormality. No intracranial mass, hemorrhage or edema. 2. Carotid atherosclerosis.  No evidence of aneurysm. 3. Chronic small vessel ischemic changes within the white matter. 4. Hyperdensity of the LEFT orbital globe, presumably chronic. Clinical correlation recommended. Electronically Signed   By: Franki Cabot M.D.   On: 01/25/2018 17:43    Procedures Procedures (including critical care time)  Medications Ordered in ED Medications  metoprolol tartrate (LOPRESSOR) tablet 50 mg (has no administration in time range)  cloNIDine (CATAPRES) tablet 0.1 mg (has no administration in time range)     Initial Impression / Assessment and Plan / ED Course  I have reviewed the triage vital signs and the nursing notes.  Pertinent labs & imaging results that were available during my care of the patient were reviewed by me and considered in my medical decision making (see chart for details).  Clinical Course as of Jan 27 1132  Thu Jan 25, 2018  1847 Patient arrives  into the room in no distress.  Her blood pressure is quite elevated here.  She is had a normal neuro exam otherwise unremarkable physical exam.  She had a Noncon CT that was negative and her initial screening labs are normal.  I am adding on a sedimentation rate and putting her back in for CT Angie of brain.  Also ordered her evening blood pressure medicine and a dose of oral Catapres.   [MB]  2053 Patient's sedimentation rate is normal at 18.  Awaiting on the results of her CT angio.  Blood pressure remains high.   [MB]  2145 She is headache improved.  Her lab work is unremarkable including her CT imaging.  She is comfortable with going home and she understands she needs to follow-up with a primary care doctor regarding her headache and her blood pressure management.   [MB]    Clinical Course User Index [MB] Hayden Rasmussen, MD      Final Clinical  Impressions(s) / ED Diagnoses   Final diagnoses:  Acute nonintractable headache, unspecified headache type  Essential hypertension    ED Discharge Orders    None       Hayden Rasmussen, MD 01/26/18 1134

## 2018-01-25 NOTE — ED Notes (Signed)
Patient transported to CT 

## 2018-01-25 NOTE — ED Provider Notes (Signed)
Patient placed in Quick Look pathway, seen and evaluated   Chief Complaint: headache   HPI: Stacy Adams is a 77 y.o. female who presents to the ED from Urgent Care with c/o headache that has been off and on for the past month. Patient reports she was treated for a sinus infection but the headaches have continued. Patient has also taken muscle relaxer and tyelnol without relief. Patient rates the headache as 10/10.   ROS: Neuro: headache  Physical Exam:  BP (S) (!) 208/88 (BP Location: Right Arm)   Pulse 63   Temp 98.2 F (36.8 C) (Oral)   Resp 20   Ht 5' (1.524 m)   Wt 67.6 kg   SpO2 99%   BMI 29.10 kg/m    Gen: No distress  Neuro: Awake and Alert, grips are equal, no pronator drift  Skin: Warm and dry   Initiation of care has begun. The patient has been counseled on the process, plan, and necessity for staying for the completion/evaluation, and the remainder of the medical screening examination    Ashley Murrain, NP 01/25/18 1625    Tegeler, Gwenyth Allegra, MD 01/26/18 [phone]

## 2018-01-25 NOTE — Discharge Instructions (Addendum)
Your evaluated in the emergency department for 1 month of intermittent headaches and elevated blood pressure.  You had a CAT scan that did not show an obvious cause of your headaches.  Your blood work was also fairly unremarkable.  Your blood pressure was elevated here but improved with time.  It will be important for you to establish care with a primary care doctor so they can continue to monitor your symptoms.  Please return if any worsening symptoms.

## 2018-02-02 DIAGNOSIS — G44209 Tension-type headache, unspecified, not intractable: Secondary | ICD-10-CM | POA: Diagnosis not present

## 2018-02-02 DIAGNOSIS — R51 Headache: Secondary | ICD-10-CM | POA: Diagnosis not present

## 2018-05-25 DIAGNOSIS — N3000 Acute cystitis without hematuria: Secondary | ICD-10-CM | POA: Diagnosis not present

## 2018-05-25 DIAGNOSIS — N3001 Acute cystitis with hematuria: Secondary | ICD-10-CM | POA: Diagnosis not present

## 2018-06-04 DIAGNOSIS — H40011 Open angle with borderline findings, low risk, right eye: Secondary | ICD-10-CM | POA: Diagnosis not present

## 2018-06-04 DIAGNOSIS — H33052 Total retinal detachment, left eye: Secondary | ICD-10-CM | POA: Diagnosis not present

## 2018-06-04 DIAGNOSIS — Z961 Presence of intraocular lens: Secondary | ICD-10-CM | POA: Diagnosis not present

## 2018-06-04 DIAGNOSIS — H353112 Nonexudative age-related macular degeneration, right eye, intermediate dry stage: Secondary | ICD-10-CM | POA: Diagnosis not present

## 2018-10-29 ENCOUNTER — Other Ambulatory Visit: Payer: Self-pay

## 2019-01-16 DIAGNOSIS — R29810 Facial weakness: Secondary | ICD-10-CM | POA: Diagnosis not present

## 2019-01-16 DIAGNOSIS — E876 Hypokalemia: Secondary | ICD-10-CM | POA: Diagnosis not present

## 2019-01-16 DIAGNOSIS — R7303 Prediabetes: Secondary | ICD-10-CM | POA: Diagnosis not present

## 2019-01-16 DIAGNOSIS — I639 Cerebral infarction, unspecified: Secondary | ICD-10-CM | POA: Diagnosis not present

## 2019-01-16 DIAGNOSIS — R29707 NIHSS score 7: Secondary | ICD-10-CM | POA: Diagnosis not present

## 2019-01-16 DIAGNOSIS — E785 Hyperlipidemia, unspecified: Secondary | ICD-10-CM | POA: Diagnosis not present

## 2019-01-16 DIAGNOSIS — R410 Disorientation, unspecified: Secondary | ICD-10-CM | POA: Diagnosis not present

## 2019-01-16 DIAGNOSIS — R29702 NIHSS score 2: Secondary | ICD-10-CM | POA: Diagnosis not present

## 2019-01-16 DIAGNOSIS — I252 Old myocardial infarction: Secondary | ICD-10-CM | POA: Diagnosis not present

## 2019-01-16 DIAGNOSIS — I6621 Occlusion and stenosis of right posterior cerebral artery: Secondary | ICD-10-CM | POA: Diagnosis not present

## 2019-01-16 DIAGNOSIS — Z79899 Other long term (current) drug therapy: Secondary | ICD-10-CM | POA: Diagnosis not present

## 2019-01-16 DIAGNOSIS — R9431 Abnormal electrocardiogram [ECG] [EKG]: Secondary | ICD-10-CM | POA: Diagnosis not present

## 2019-01-16 DIAGNOSIS — Z7982 Long term (current) use of aspirin: Secondary | ICD-10-CM | POA: Diagnosis not present

## 2019-01-16 DIAGNOSIS — I10 Essential (primary) hypertension: Secondary | ICD-10-CM | POA: Diagnosis not present

## 2019-01-16 DIAGNOSIS — R4781 Slurred speech: Secondary | ICD-10-CM | POA: Diagnosis not present

## 2019-01-16 DIAGNOSIS — I4510 Unspecified right bundle-branch block: Secondary | ICD-10-CM | POA: Diagnosis not present

## 2019-01-16 DIAGNOSIS — I340 Nonrheumatic mitral (valve) insufficiency: Secondary | ICD-10-CM

## 2019-01-16 DIAGNOSIS — I6389 Other cerebral infarction: Secondary | ICD-10-CM | POA: Diagnosis not present

## 2019-01-19 DIAGNOSIS — I6389 Other cerebral infarction: Secondary | ICD-10-CM

## 2019-01-23 DIAGNOSIS — I69351 Hemiplegia and hemiparesis following cerebral infarction affecting right dominant side: Secondary | ICD-10-CM | POA: Diagnosis not present

## 2019-01-23 DIAGNOSIS — I10 Essential (primary) hypertension: Secondary | ICD-10-CM | POA: Diagnosis not present

## 2019-01-23 DIAGNOSIS — Z9181 History of falling: Secondary | ICD-10-CM | POA: Diagnosis not present

## 2019-01-23 DIAGNOSIS — Z79899 Other long term (current) drug therapy: Secondary | ICD-10-CM | POA: Diagnosis not present

## 2019-01-23 DIAGNOSIS — R2681 Unsteadiness on feet: Secondary | ICD-10-CM | POA: Diagnosis not present

## 2019-01-23 DIAGNOSIS — I69321 Dysphasia following cerebral infarction: Secondary | ICD-10-CM | POA: Diagnosis not present

## 2019-01-23 DIAGNOSIS — R7303 Prediabetes: Secondary | ICD-10-CM | POA: Diagnosis not present

## 2019-01-23 DIAGNOSIS — Z951 Presence of aortocoronary bypass graft: Secondary | ICD-10-CM | POA: Diagnosis not present

## 2019-01-23 DIAGNOSIS — E785 Hyperlipidemia, unspecified: Secondary | ICD-10-CM | POA: Diagnosis not present

## 2019-01-23 DIAGNOSIS — I4510 Unspecified right bundle-branch block: Secondary | ICD-10-CM | POA: Diagnosis not present

## 2019-01-23 DIAGNOSIS — I252 Old myocardial infarction: Secondary | ICD-10-CM | POA: Diagnosis not present

## 2019-01-23 DIAGNOSIS — Z7982 Long term (current) use of aspirin: Secondary | ICD-10-CM | POA: Diagnosis not present

## 2019-01-29 DIAGNOSIS — I10 Essential (primary) hypertension: Secondary | ICD-10-CM | POA: Diagnosis not present

## 2019-01-29 DIAGNOSIS — I119 Hypertensive heart disease without heart failure: Secondary | ICD-10-CM | POA: Diagnosis not present

## 2019-01-29 DIAGNOSIS — I69320 Aphasia following cerebral infarction: Secondary | ICD-10-CM | POA: Diagnosis not present

## 2019-01-29 DIAGNOSIS — N3000 Acute cystitis without hematuria: Secondary | ICD-10-CM | POA: Diagnosis not present

## 2019-01-29 DIAGNOSIS — E782 Mixed hyperlipidemia: Secondary | ICD-10-CM | POA: Diagnosis not present

## 2019-01-29 DIAGNOSIS — E876 Hypokalemia: Secondary | ICD-10-CM | POA: Diagnosis not present

## 2019-02-04 DIAGNOSIS — I69351 Hemiplegia and hemiparesis following cerebral infarction affecting right dominant side: Secondary | ICD-10-CM | POA: Diagnosis not present

## 2019-02-04 DIAGNOSIS — I69321 Dysphasia following cerebral infarction: Secondary | ICD-10-CM | POA: Diagnosis not present

## 2019-02-04 DIAGNOSIS — E785 Hyperlipidemia, unspecified: Secondary | ICD-10-CM | POA: Diagnosis not present

## 2019-02-04 DIAGNOSIS — I10 Essential (primary) hypertension: Secondary | ICD-10-CM | POA: Diagnosis not present

## 2019-02-04 DIAGNOSIS — I4510 Unspecified right bundle-branch block: Secondary | ICD-10-CM | POA: Diagnosis not present

## 2019-02-28 DIAGNOSIS — I69320 Aphasia following cerebral infarction: Secondary | ICD-10-CM | POA: Diagnosis not present

## 2019-02-28 DIAGNOSIS — I119 Hypertensive heart disease without heart failure: Secondary | ICD-10-CM | POA: Diagnosis not present

## 2019-05-20 ENCOUNTER — Telehealth: Payer: Self-pay

## 2019-05-20 NOTE — Telephone Encounter (Signed)
Patient's husband called this morning stating that Stacy Adams is having trouble with her bowels.  He states that she has been constipated off and on for about 2 months.  The laxatives are not working and what does Dr. Tobie Poet recommend.  He also wants to know if any of her medication are causing her to be constipated. Please advise.

## 2019-05-22 NOTE — Telephone Encounter (Signed)
Recommend magnesium citrate. 1/2 bottle then repeat in 2 hours if no bm.

## 2019-05-22 NOTE — Telephone Encounter (Signed)
Husband notified  

## 2019-05-30 ENCOUNTER — Encounter: Payer: Self-pay | Admitting: Family Medicine

## 2019-05-30 ENCOUNTER — Ambulatory Visit (INDEPENDENT_AMBULATORY_CARE_PROVIDER_SITE_OTHER): Payer: PPO | Admitting: Family Medicine

## 2019-05-30 ENCOUNTER — Other Ambulatory Visit: Payer: Self-pay

## 2019-05-30 VITALS — BP 122/66 | HR 84 | Temp 96.3°F | Resp 15 | Ht 60.0 in | Wt 127.0 lb

## 2019-05-30 DIAGNOSIS — I10 Essential (primary) hypertension: Secondary | ICD-10-CM

## 2019-05-30 DIAGNOSIS — R4701 Aphasia: Secondary | ICD-10-CM | POA: Diagnosis not present

## 2019-05-30 DIAGNOSIS — I6930 Unspecified sequelae of cerebral infarction: Secondary | ICD-10-CM

## 2019-05-30 DIAGNOSIS — I2510 Atherosclerotic heart disease of native coronary artery without angina pectoris: Secondary | ICD-10-CM

## 2019-05-30 DIAGNOSIS — R7301 Impaired fasting glucose: Secondary | ICD-10-CM | POA: Diagnosis not present

## 2019-05-30 NOTE — Progress Notes (Signed)
Subjective:  Patient ID: Stacy Adams, female    DOB: 10-23-1940  Age: 79 y.o. MRN: [id number]  Chief Complaint  Patient presents with  . Hyperlipidemia  . Hypertension  . Aphasia   Patient is an 79 year old white female with history of hyperlipidemia, hypertension, CAD, and stroke with continued although improved aphasia who presents for follow-up today.  The patient had a stroke in October 2020.  Since then her left-sided facial droop has improved although she continues to have aphasia.  Her aphasia seems to wax and wane at times.  The patient was started on Lipitor, Plavix, metoprolol and Zestril in October.  She was on aspirin for 30 days and then this was discontinued.  She has not followed up with neurology and is unsure if an appointment was made.  Patient denies any chest pain or difficulty breathing.  She is not fasting today.  The patient reports eating healthy.  Hyperlipidemia Pertinent negatives include no chest pain, myalgias or shortness of breath.  Hypertension Pertinent negatives include no chest pain or shortness of breath.    Social History   Socioeconomic History  . Marital status: Married    Spouse name: Not on file  . Number of children: 2  . Years of education: Not on file  . Highest education level: Not on file  Occupational History  . Not on file  Tobacco Use  . Smoking status: Never Smoker  . Smokeless tobacco: Never Used  Substance and Sexual Activity  . Alcohol use: No  . Drug use: No  . Sexual activity: Yes  Other Topics Concern  . Not on file  Social History Narrative   She is a married, mother of one was 1 grandchild that unfortunately died early. She is exercising anywhere from 15-60 minutes a day. She does not smoke or drink.   Social Determinants of Health   Financial Resource Strain:   . Difficulty of Paying Living Expenses:   Food Insecurity:   . Worried About Charity fundraiser in the Last Year:   . Arboriculturist in the Last  Year:   Transportation Needs:   . Film/video editor (Medical):   Marland Kitchen Lack of Transportation (Non-Medical):   Physical Activity:   . Days of Exercise per Week:   . Minutes of Exercise per Session:   Stress:   . Feeling of Stress :   Social Connections:   . Frequency of Communication with Friends and Family:   . Frequency of Social Gatherings with Friends and Family:   . Attends Religious Services:   . Active Member of Clubs or Organizations:   . Attends Archivist Meetings:   Marland Kitchen Marital Status:    Past Medical History:  Diagnosis Date  . Anxiety   . Aphasia following cerebral infarction   . Blindness left eye category 4, normal vision right eye   . CAD (coronary artery disease), native coronary artery 03/25/2011   80-90% LAD after D1 followed by 60-70% consecutive lesions at T2. Circumflex: 95% ostial, RCA 95-99% --> referred for CABG  . CAD, multiple vessel 08/24/2007  . CHF (congestive heart failure) ([place])   . GERD (gastroesophageal reflux disease)   . Hyperlipidemia 12/21/2006  . HYPERTENSION 12/21/2006  . Hypokalemia   . NSTEMI (non-ST elevated myocardial infarction) Urgent cath 03/25/2011   Multivessel CAD  . Osteopenia 12/21/2006  . PAF (paroxysmal atrial fibrillation) [facility]) January 2013   Postop A. fib, no recurrence  . S/P CABG x  4 03/26/2011   LIMA-LAD, SVG-Diag, SVG-Cx-OM, SVG-RCA; Intra-Op TEE: EF 60-65%.  Marland Kitchen TIA (transient ischemic attack)    Family History  Problem Relation Age of Onset  . Alzheimer's disease Mother   . Alzheimer's disease Father   . Cancer Brother   . Lupus Brother     Review of Systems  Constitutional: Negative for chills, fatigue and fever.  HENT: Negative for congestion, ear pain and sore throat.   Respiratory: Negative for cough and shortness of breath.   Cardiovascular: Negative for chest pain.  Gastrointestinal: Negative for abdominal pain, constipation, diarrhea, nausea and vomiting.  Genitourinary: Negative for  urgency.  Musculoskeletal: Negative for arthralgias, gait problem and myalgias.  Neurological: Positive for speech difficulty. Negative for dizziness, facial asymmetry and weakness.     Objective:  BP 122/66 (BP Location: Right Arm, Patient Position: Sitting)   Pulse 84   Temp (!) 96.3 F (35.7 C) (Temporal)   Resp 15   Ht 5' (1.524 m)   Wt 127 lb (57.6 kg)   BMI 24.80 kg/m   BP/Weight 05/30/2019 01/25/2018 0000000  Systolic BP 123XX123 [id number]  Diastolic BP 66 63 123XX123  Wt. (Lbs) 127 149 -  BMI 24.8 29.1 -    Physical Exam Vitals reviewed.  Constitutional:      Appearance: Normal appearance.  Neck:     Vascular: No carotid bruit.  Cardiovascular:     Rate and Rhythm: Normal rate and regular rhythm.     Pulses: Normal pulses.     Heart sounds: Normal heart sounds.  Pulmonary:     Breath sounds: Normal breath sounds.  Abdominal:     General: Bowel sounds are normal.     Palpations: Abdomen is soft.     Tenderness: There is no abdominal tenderness.  Neurological:     Mental Status: She is alert.     Cranial Nerves: No cranial nerve deficit.     Comments: Speech is slurred. Understandable. Chooses wrong word at times.  Psychiatric:        Mood and Affect: Mood normal.        Behavior: Behavior normal.     Lab Results  Component Value Date   WBC 9.4 06/06/2019   HGB 14.5 06/06/2019   HCT 45.4 06/06/2019   PLT 264 06/06/2019   GLUCOSE 101 (H) 06/06/2019   CHOL 147 06/06/2019   TRIG 142 06/06/2019   HDL 34 (L) 06/06/2019   LDLDIRECT 203.5 01/09/2008   LDLCALC 88 06/06/2019   ALT 28 03/25/2011   AST 21 06/06/2019   NA 147 (H) 06/06/2019   K 3.1 (L) 06/06/2019   CL 104 06/06/2019   CREATININE 0.95 06/06/2019   BUN 12 06/06/2019   CO2 28 01/25/2018   TSH 3.830 03/25/2011   INR 1.36 03/26/2011   HGBA1C 5.7 (H) 06/06/2019      Assessment & Plan:  1. CAD, multiple vessel The current medical regimen is effective;  continue present plan and medications. -  Lipid panel; Future  2. Essential hypertension, benign The current medical regimen is effective;  continue present plan and medications. - CBC with Differential/Platelet; Future - Comp. Metabolic Panel (12); Future  3. Impaired fasting glucose The current medical regimen is effective;  continue present plan and medications. - Hemoglobin A1c; Future  4. Sequelae of stroke, aphasia.- continue plavix and statin. Continue at home with speech therapy.      Follow-up: Return in about 3 months (around 08/30/2019).  AVS was given to patient  prior to departure.  Rochel Brome [facility] [phone]

## 2019-06-03 ENCOUNTER — Telehealth: Payer: Self-pay

## 2019-06-03 NOTE — Telephone Encounter (Signed)
Stacy Adams called for his wife requesting that we cancel the neurology referal.  His wife has decided that she does not want the referral at this time.

## 2019-06-06 ENCOUNTER — Other Ambulatory Visit: Payer: Self-pay

## 2019-06-06 ENCOUNTER — Other Ambulatory Visit: Payer: PPO

## 2019-06-06 DIAGNOSIS — R7301 Impaired fasting glucose: Secondary | ICD-10-CM | POA: Diagnosis not present

## 2019-06-06 DIAGNOSIS — I10 Essential (primary) hypertension: Secondary | ICD-10-CM | POA: Diagnosis not present

## 2019-06-06 DIAGNOSIS — I2510 Atherosclerotic heart disease of native coronary artery without angina pectoris: Secondary | ICD-10-CM | POA: Diagnosis not present

## 2019-06-07 LAB — COMP. METABOLIC PANEL (12)
AST: 21 IU/L (ref 0–40)
Albumin/Globulin Ratio: 1.6 (ref 1.2–2.2)
Albumin: 4.2 g/dL (ref 3.7–4.7)
Alkaline Phosphatase: 96 IU/L (ref 39–117)
BUN/Creatinine Ratio: 13 (ref 12–28)
BUN: 12 mg/dL (ref 8–27)
Bilirubin Total: 0.9 mg/dL (ref 0.0–1.2)
Calcium: 9.6 mg/dL (ref 8.7–10.3)
Chloride: 104 mmol/L (ref 96–106)
Creatinine, Ser: 0.95 mg/dL (ref 0.57–1.00)
GFR calc Af Amer: 65 mL/min/{1.73_m2} (ref >59)
GFR calc non Af Amer: 57 mL/min/{1.73_m2} — ABNORMAL LOW (ref >59)
Globulin, Total: 2.7 g/dL (ref 1.5–4.5)
Glucose: 101 mg/dL — ABNORMAL HIGH (ref 65–99)
Potassium: 3.1 mmol/L — ABNORMAL LOW (ref 3.5–5.2)
Sodium: 147 mmol/L — ABNORMAL HIGH (ref 134–144)
Total Protein: 6.9 g/dL (ref 6.0–8.5)

## 2019-06-07 LAB — CBC WITH DIFFERENTIAL/PLATELET
Basophils Absolute: 0.1 10*3/uL (ref 0.0–0.2)
Basos: 1 %
EOS (ABSOLUTE): 0.2 10*3/uL (ref 0.0–0.4)
Eos: 2 %
Hematocrit: 45.4 % (ref 34.0–46.6)
Hemoglobin: 14.5 g/dL (ref 11.1–15.9)
Immature Grans (Abs): 0 10*3/uL (ref 0.0–0.1)
Immature Granulocytes: 0 %
Lymphocytes Absolute: 1.2 10*3/uL (ref 0.7–3.1)
Lymphs: 13 %
MCH: 28.2 pg (ref 26.6–33.0)
MCHC: 31.9 g/dL (ref 31.5–35.7)
MCV: 88 fL (ref 79–97)
Monocytes Absolute: 0.5 10*3/uL (ref 0.1–0.9)
Monocytes: 5 %
Neutrophils Absolute: 7.4 10*3/uL — ABNORMAL HIGH (ref 1.4–7.0)
Neutrophils: 79 %
Platelets: 264 10*3/uL (ref 150–450)
RBC: 5.14 x10E6/uL (ref 3.77–5.28)
RDW: 12.9 % (ref 11.7–15.4)
WBC: 9.4 10*3/uL (ref 3.4–10.8)

## 2019-06-07 LAB — LIPID PANEL
Chol/HDL Ratio: 4.3 ratio (ref 0.0–4.4)
Cholesterol, Total: 147 mg/dL (ref 100–199)
HDL: 34 mg/dL — ABNORMAL LOW (ref >39)
LDL Chol Calc (NIH): 88 mg/dL (ref 0–99)
Triglycerides: 142 mg/dL (ref 0–149)
VLDL Cholesterol Cal: 25 mg/dL (ref 5–40)

## 2019-06-07 LAB — HEMOGLOBIN A1C
Est. average glucose Bld gHb Est-mCnc: 117 mg/dL
Hgb A1c MFr Bld: 5.7 % — ABNORMAL HIGH (ref 4.8–5.6)

## 2019-06-07 LAB — CARDIOVASCULAR RISK ASSESSMENT

## 2019-06-10 ENCOUNTER — Other Ambulatory Visit: Payer: Self-pay

## 2019-06-10 DIAGNOSIS — R799 Abnormal finding of blood chemistry, unspecified: Secondary | ICD-10-CM

## 2019-06-10 MED ORDER — POTASSIUM CHLORIDE CRYS ER 20 MEQ PO TBCR: 20.0000 meq | EXTENDED_RELEASE_TABLET | Freq: Every day | ORAL | 1 refills | Status: DC

## 2019-06-13 ENCOUNTER — Encounter: Payer: Self-pay | Admitting: Family Medicine

## 2019-06-13 DIAGNOSIS — I6930 Unspecified sequelae of cerebral infarction: Secondary | ICD-10-CM | POA: Insufficient documentation

## 2019-06-13 DIAGNOSIS — R4701 Aphasia: Secondary | ICD-10-CM | POA: Insufficient documentation

## 2019-06-17 NOTE — Telephone Encounter (Signed)
Stacy Adams notified to cancel neurology referral

## 2019-07-08 ENCOUNTER — Encounter: Payer: Self-pay | Admitting: Family Medicine

## 2019-07-08 ENCOUNTER — Other Ambulatory Visit: Payer: Self-pay

## 2019-07-08 ENCOUNTER — Ambulatory Visit (INDEPENDENT_AMBULATORY_CARE_PROVIDER_SITE_OTHER): Payer: PPO | Admitting: Family Medicine

## 2019-07-08 VITALS — Temp 97.5°F | Resp 18 | Ht 62.0 in | Wt 128.2 lb

## 2019-07-08 DIAGNOSIS — E875 Hyperkalemia: Secondary | ICD-10-CM

## 2019-07-08 DIAGNOSIS — I209 Angina pectoris, unspecified: Secondary | ICD-10-CM | POA: Diagnosis not present

## 2019-07-08 DIAGNOSIS — N3001 Acute cystitis with hematuria: Secondary | ICD-10-CM

## 2019-07-08 LAB — COMPREHENSIVE METABOLIC PANEL
ALT: 25 IU/L (ref 0–32)
AST: 22 IU/L (ref 0–40)
Albumin/Globulin Ratio: 2.1 (ref 1.2–2.2)
Albumin: 4.6 g/dL (ref 3.7–4.7)
Alkaline Phosphatase: 118 IU/L — ABNORMAL HIGH (ref 39–117)
BUN/Creatinine Ratio: 14 (ref 12–28)
BUN: 10 mg/dL (ref 8–27)
Bilirubin Total: 0.9 mg/dL (ref 0.0–1.2)
CO2: 28 mmol/L (ref 20–29)
Calcium: 9.6 mg/dL (ref 8.7–10.3)
Chloride: 102 mmol/L (ref 96–106)
Creatinine, Ser: 0.74 mg/dL (ref 0.57–1.00)
GFR calc Af Amer: 88 mL/min/{1.73_m2} (ref >59)
GFR calc non Af Amer: 77 mL/min/{1.73_m2} (ref >59)
Globulin, Total: 2.2 g/dL (ref 1.5–4.5)
Glucose: 97 mg/dL (ref 65–99)
Potassium: 3.9 mmol/L (ref 3.5–5.2)
Sodium: 145 mmol/L — ABNORMAL HIGH (ref 134–144)
Total Protein: 6.8 g/dL (ref 6.0–8.5)

## 2019-07-08 LAB — POCT URINALYSIS DIPSTICK
Bilirubin, UA: NEGATIVE
Glucose, UA: NEGATIVE
Ketones, UA: NEGATIVE
Nitrite, UA: POSITIVE
Protein, UA: POSITIVE — AB
Spec Grav, UA: 1.02 (ref 1.010–1.025)
Urobilinogen, UA: NEGATIVE E.U./dL — AB
pH, UA: 6.5 (ref 5.0–8.0)

## 2019-07-08 LAB — CBC WITH DIFFERENTIAL/PLATELET
Basophils Absolute: 0.1 10*3/uL (ref 0.0–0.2)
Basos: 1 %
EOS (ABSOLUTE): 0.3 10*3/uL (ref 0.0–0.4)
Eos: 2 %
Hematocrit: 42.6 % (ref 34.0–46.6)
Hemoglobin: 14.4 g/dL (ref 11.1–15.9)
Immature Grans (Abs): 0 10*3/uL (ref 0.0–0.1)
Immature Granulocytes: 0 %
Lymphocytes Absolute: 1.4 10*3/uL (ref 0.7–3.1)
Lymphs: 13 %
MCH: 29.2 pg (ref 26.6–33.0)
MCHC: 33.8 g/dL (ref 31.5–35.7)
MCV: 86 fL (ref 79–97)
Monocytes Absolute: 0.6 10*3/uL (ref 0.1–0.9)
Monocytes: 6 %
Neutrophils Absolute: 8.3 10*3/uL — ABNORMAL HIGH (ref 1.4–7.0)
Neutrophils: 78 %
Platelets: 197 10*3/uL (ref 150–450)
RBC: 4.93 x10E6/uL (ref 3.77–5.28)
RDW: 12.7 % (ref 11.7–15.4)
WBC: 10.7 10*3/uL (ref 3.4–10.8)

## 2019-07-08 LAB — TROPONIN I: Troponin I: <0.01 ng/mL (ref 0.00–0.04)

## 2019-07-08 MED ORDER — POTASSIUM CHLORIDE ER 10 MEQ PO CPCR: 10.0000 meq | ORAL_CAPSULE | Freq: Two times a day (BID) | ORAL | 2 refills | Status: DC

## 2019-07-08 MED ORDER — DOXYCYCLINE HYCLATE 100 MG PO TABS: 100.0000 mg | ORAL_TABLET | Freq: Two times a day (BID) | ORAL | 0 refills | Status: DC

## 2019-07-08 MED ORDER — FLUCONAZOLE 150 MG PO TABS: 150.0000 mg | ORAL_TABLET | Freq: Once | ORAL | 0 refills | Status: AC

## 2019-07-08 MED ORDER — NITROGLYCERIN 0.4 MG SL SUBL: 0.4000 mg | SUBLINGUAL_TABLET | SUBLINGUAL | 3 refills | Status: AC | PRN

## 2019-07-08 NOTE — Patient Instructions (Signed)
Bladder infection - bactrim ds ordered. Fluconazole ordered in case yeast infection. Angina - nitroglycerine given. Stat labs done.  Refer to cardiology.  High potassium - rechecking potassium. Changed pill to 10 meq versus 20 meq to decrease size of pill.    Angina  Angina is very bad discomfort or pain in the chest, neck, arm, jaw, or back. The discomfort is caused by a lack of blood in the middle layer of the heart wall (myocardium). What are the causes? This condition is caused by a buildup of fat and cholesterol (plaque) in your arteries (atherosclerosis). This buildup narrows the arteries and makes it hard for blood to flow. What increases the risk? You are more likely to develop this condition if:  You have high levels of cholesterol in your blood.  You have high blood pressure (hypertension).  You have diabetes.  You have a family history of heart disease.  You are not active, or you do not exercise enough.  You feel sad (depressed).  You have been treated with high energy rays (radiation) on the left side of your chest. Other risk factors are:  Using tobacco.  Being very overweight (obese).  Eating a diet high in unhealthy fats (saturated fats).  Having stress, or being exposed to things that cause stress.  Using drugs, such as cocaine. Women have a greater risk for angina if:  They are older than 48.  They have stopped having their period (are in postmenopause). What are the signs or symptoms? Common symptoms of this condition in both men and women may include:  Chest pain, which may: ? Feel like a crushing or squeezing in the chest. ? Feel like a tightness, pressure, fullness, or heaviness in the chest. ? Last for more than a few minutes at a time. ? Stop and come back (recur) after a few minutes.  Pain in the neck, arm, jaw, or back.  Heartburn or upset stomach (indigestion) for no reason.  Being short of breath.  Feeling sick to your stomach  (nauseous).  Sudden cold sweats. Women and people with diabetes may have other symptoms that are not usual, such as feeling:  Tired (fatigue).  Worried or nervous (anxious) for no reason.  Weak for no reason.  Dizzy or passing out (fainting). How is this treated? This condition may be treated with:  Medicines. These are given to: ? Prevent blood clots. ? Prevent heart attack. ? Relax blood vessels and improve blood flow to the heart (nitrates). ? Reduce blood pressure. ? Improve the pumping action of the heart. ? Reduce fat and cholesterol in the blood.  A procedure to widen a narrowed or blocked artery in the heart (angioplasty).  Surgery to allow blood to go around a blocked artery (coronary artery bypass surgery). Follow these instructions at home: Medicines  Take over-the-counter and prescription medicines only as told by your doctor.  Do not take these medicines unless your doctor says that you can: ? NSAIDs. These include:  Ibuprofen.  Naproxen. ? Vitamin supplements that have vitamin A, vitamin E, or both. ? Hormone therapy that contains estrogen with or without progestin.  Nitroglycerine given.   Eating and drinking   Eat a heart-healthy diet that includes: ? Lots of fresh fruits and vegetables. ? Whole grains. ? Low-fat (lean) protein. ? Low-fat dairy products.  Follow instructions from your doctor about what you cannot eat or drink. Activity  Follow an exercise program that your doctor tells you.  Talk with your doctor about  joining a program to help improve the health of your heart (cardiac rehab).  When you feel tired, take a break. Plan breaks if you know you are going to feel tired. Lifestyle   Do not use any products that contain nicotine or tobacco. This includes cigarettes, e-cigarettes, and chewing tobacco. If you need help quitting, ask your doctor.  If your doctor says you can drink alcohol: ? Limit how much you use to:  0-1 drink  a day for women who are not pregnant.  0-2 drinks a day for men. ? Be aware of how much alcohol is in your drink. In the [place] one drink equals:  One 12 oz bottle of beer (355 mL).  One 5 oz glass of wine (148 mL).  One 1 oz glass of hard liquor (44 mL). General instructions  Stay at a healthy weight. If your doctor tells you to do so, work with him or her to lose weight.  Learn to deal with stress. If you need help, ask your doctor.  Keep your vaccines up to date. Get a flu shot every year.  Talk with your doctor if you feel sad. Take a screening test to see if you are at risk for depression.  Work with your doctor to manage any other health problems that you have. These may include diabetes or high blood pressure.  Keep all follow-up visits as told by your doctor. This is important. Get help right away if:  You have pain in your chest, neck, arm, jaw, or back, and the pain: ? Lasts more than a few minutes. ? Comes back. ? Does not get better after you take medicine under your tongue (sublingual nitroglycerin). ? Keeps getting worse. ? Comes more often.  You have any of these problems for no reason: ? Sweating a lot. ? Heartburn or upset stomach. ? Shortness of breath. ? Trouble breathing. ? Feeling sick to your stomach. ? Throwing up (vomiting). ? Feeling more tired than normal. ? Feeling nervous or worrying more than normal. ? Weakness.  You are suddenly dizzy or light-headed.  You pass out. These symptoms may be an emergency. Do not wait to see if the symptoms will go away. Get medical help right away. Call your local emergency services (911 in the [place] Do not drive yourself to the hospital. Summary  Angina is very bad discomfort or pain in the chest, neck, arm, neck, or back.  Symptoms include chest pain, heartburn or upset stomach for no reason, and shortness of breath.  Women or people with diabetes may have symptoms that are not usual, such as feeling  nervous or worried for no reason, weak for no reason, or tired.  Take all medicines only as told by your doctor.  You should eat a heart-healthy diet and follow an exercise program. This information is not intended to replace advice given to you by your health care provider. Make sure you discuss any questions you have with your health care provider. Document Revised: 10/30/2017 Document Reviewed: 10/30/2017 Elsevier Patient Education  [employer].

## 2019-07-08 NOTE — Progress Notes (Signed)
Acute Office Visit  Subjective:    Patient ID: Stacy Adams, female    DOB: 1940/04/28, 79 y.o.   MRN: [id number]  Chief Complaint  Patient presents with  . Urinary Tract Infection    HPI Patient is in today for possible bladder infection. C/o frequent urination since last night. No pain. No dysuria. No abdominal pain or flank pain. Denies nausea or vomiting.   She also reports having chest pain 2 days ago which lasted for greater an hour.  Past Medical History:  Diagnosis Date  . Anxiety   . Aphasia following cerebral infarction   . Blindness left eye category 4, normal vision right eye   . CAD (coronary artery disease), native coronary artery 03/25/2011   80-90% LAD after D1 followed by 60-70% consecutive lesions at T2. Circumflex: 95% ostial, RCA 95-99% --> referred for CABG  . CAD, multiple vessel 08/24/2007  . CHF (congestive heart failure) ([place])   . GERD (gastroesophageal reflux disease)   . Hyperlipidemia 12/21/2006  . HYPERTENSION 12/21/2006  . Hypokalemia   . NSTEMI (non-ST elevated myocardial infarction) Urgent cath 03/25/2011   Multivessel CAD  . Osteopenia 12/21/2006  . PAF (paroxysmal atrial fibrillation) [facility]) January 2013   Postop A. fib, no recurrence  . S/P CABG x 4 03/26/2011   LIMA-LAD, SVG-Diag, SVG-Cx-OM, SVG-RCA; Intra-Op TEE: EF 60-65%.  Marland Kitchen TIA (transient ischemic attack)     Past Surgical History:  Procedure Laterality Date  . ABDOMINAL HYSTERECTOMY    . CARDIAC CATHETERIZATION  03/25/2011   EF 60 is 55%.80-90% LAD after D1 followed by 60-70% consecutive lesions at D2. Circumflex: 95% ostial, RCA 95-99% --> referred for CABG  . CORONARY ARTERY BYPASS GRAFT  03/26/2011   Procedure: CORONARY ARTERY BYPASS GRAFTING (CABG);  Surgeon: Grace Isaac, MD;  Location: [place];  Service: Open Heart Surgery;  Laterality: N/A;  . LEFT HEART CATHETERIZATION WITH CORONARY ANGIOGRAM N/A 03/25/2011   Procedure: LEFT HEART CATHETERIZATION WITH CORONARY  ANGIOGRAM;  Surgeon: Leonie Man, MD;  Location: [facility] CATH LAB;  Service: Cardiovascular;  Laterality: N/A;    Family History  Problem Relation Age of Onset  . Alzheimer's disease Mother   . Alzheimer's disease Father   . Cancer Brother   . Lupus Brother     Social History   Socioeconomic History  . Marital status: Married    Spouse name: Not on file  . Number of children: 2  . Years of education: Not on file  . Highest education level: Not on file  Occupational History  . Not on file  Tobacco Use  . Smoking status: Never Smoker  . Smokeless tobacco: Never Used  Substance and Sexual Activity  . Alcohol use: No  . Drug use: No  . Sexual activity: Yes  Other Topics Concern  . Not on file  Social History Narrative   She is a married, mother of one was 1 grandchild that unfortunately died early. She is exercising anywhere from 15-60 minutes a day. She does not smoke or drink.   Social Determinants of Health   Financial Resource Strain:   . Difficulty of Paying Living Expenses:   Food Insecurity:   . Worried About Charity fundraiser in the Last Year:   . Arboriculturist in the Last Year:   Transportation Needs:   . Film/video editor (Medical):   Marland Kitchen Lack of Transportation (Non-Medical):   Physical Activity:   . Days of Exercise per Week:   .  Minutes of Exercise per Session:   Stress:   . Feeling of Stress :   Social Connections:   . Frequency of Communication with Friends and Family:   . Frequency of Social Gatherings with Friends and Family:   . Attends Religious Services:   . Active Member of Clubs or Organizations:   . Attends Archivist Meetings:   Marland Kitchen Marital Status:   Intimate Partner Violence:   . Fear of Current or Ex-Partner:   . Emotionally Abused:   Marland Kitchen Physically Abused:   . Sexually Abused:     Outpatient Medications Prior to Visit  Medication Sig Dispense Refill  . atorvastatin (LIPITOR) 40 MG tablet Take 40 mg by mouth at bedtime.     . clopidogrel (PLAVIX) 75 MG tablet Take 75 mg by mouth daily.    Marland Kitchen lisinopril (ZESTRIL) 5 MG tablet Take 5 mg by mouth daily.    . metoprolol tartrate (LOPRESSOR) 25 MG tablet Take 50 mg by mouth 2 (two) times daily.    . potassium chloride SA (KLOR-CON) 20 MEQ tablet Take 1 tablet (20 mEq total) by mouth daily. 30 tablet 1   No facility-administered medications prior to visit.    Allergies  Allergen Reactions  . Ciprofloxacin     Pruritis and related conditions, NOS  . Macrobid [Nitrofurantoin]     Abdominal pain  . Sulfa Antibiotics Other (See Comments)    Swelling years ago     Review of Systems  Constitutional: Negative for chills, diaphoresis, fatigue and fever.  Respiratory: Negative for shortness of breath.   Cardiovascular: Positive for chest pain.  Gastrointestinal: Negative for abdominal pain, nausea and vomiting.       Objective:    Physical Exam Vitals reviewed.  Constitutional:      Appearance: Normal appearance. She is normal weight.  Cardiovascular:     Rate and Rhythm: Normal rate and regular rhythm.     Pulses: Normal pulses.     Heart sounds: Normal heart sounds.  Pulmonary:     Effort: Pulmonary effort is normal. No respiratory distress.     Breath sounds: Normal breath sounds.  Abdominal:     General: Abdomen is flat. Bowel sounds are normal.     Palpations: Abdomen is soft.     Tenderness: There is no abdominal tenderness. There is no right CVA tenderness or left CVA tenderness.  Neurological:     Mental Status: She is alert.     Comments: Aphasia present.   Psychiatric:        Mood and Affect: Mood normal.        Behavior: Behavior normal.     BP 136/86   Pulse 72   Temp (!) 97.5 F (36.4 C)   Resp 18   Ht 5\' 2"  (1.575 m)   Wt 128 lb 3.2 oz (58.2 kg)   BMI 23.45 kg/m  Wt Readings from Last 3 Encounters:  07/08/19 128 lb 3.2 oz (58.2 kg)  05/30/19 127 lb (57.6 kg)  01/25/18 149 lb (67.6 kg)    Health Maintenance Due  Topic  Date Due  . DEXA SCAN  Never done  . PNA vac Low Risk Adult (1 of 2 - PCV13) Never done  . TETANUS/TDAP  09/26/2006    There are no preventive care reminders to display for this patient.   Lab Results  Component Value Date   TSH 3.830 03/25/2011   Lab Results  Component Value Date   WBC 9.4 06/06/2019  HGB 14.5 06/06/2019   HCT 45.4 06/06/2019   MCV 88 06/06/2019   PLT 264 06/06/2019   Lab Results  Component Value Date   NA 147 (H) 06/06/2019   K 3.1 (L) 06/06/2019   CO2 28 01/25/2018   GLUCOSE 101 (H) 06/06/2019   BUN 12 06/06/2019   CREATININE 0.95 06/06/2019   BILITOT 0.9 06/06/2019   ALKPHOS 96 06/06/2019   AST 21 06/06/2019   ALT 28 03/25/2011   PROT 6.9 06/06/2019   ALBUMIN 4.2 06/06/2019   CALCIUM 9.6 06/06/2019   ANIONGAP 9 01/25/2018   Lab Results  Component Value Date   CHOL 147 06/06/2019   Lab Results  Component Value Date   HDL 34 (L) 06/06/2019   Lab Results  Component Value Date   LDLCALC 88 06/06/2019   Lab Results  Component Value Date   TRIG 142 06/06/2019   Lab Results  Component Value Date   CHOLHDL 4.3 06/06/2019   Lab Results  Component Value Date   HGBA1C 5.7 (H) 06/06/2019      EKG no changes. Assessment & Plan:  1. Hyperkalemia rechecking potassium. Changed pill to 10 meq versus 20 meq to decrease size of pill. .  - potassium chloride (MICRO-K) 10 MEQ CR capsule; Take 1 capsule (10 mEq total) by mouth 2 (two) times daily.  Dispense: 30 capsule; Refill: 2  2. Acute cystitis with hematuria Concerning for UTI - start doxycycline (VIBRA-TABS) 100 MG tablet; Take 1 tablet (100 mg total) by mouth 2 (two) times daily.  Dispense: 10 tablet; Refill: 0 - fluconazole (DIFLUCAN) 150 MG tablet; Take 1 tablet (150 mg total) by mouth once for 1 dose.  Dispense: 2 tablet; Refill: 0 - Urinalysis Dipstick - Urine Culture  3. Angina pectoris ([place]) With cardiac history will proceed with work up.  - CBC with Differential/Platelet -  Comprehensive metabolic panel - Troponin I - nitroGLYCERIN (NITROSTAT) 0.4 MG SL tablet; Place 1 tablet (0.4 mg total) under the tongue every 5 (five) minutes as needed for chest pain.  Dispense: 50 tablet; Refill: 3 - EKG 12-Lead  - I intended to refer her back to her cardiologist, but upon review of his notes he is aware of the chest pain she has.  He encouraged her to try the ntg also.  Rochel Brome, MD

## 2019-07-10 LAB — URINE CULTURE

## 2019-07-16 ENCOUNTER — Encounter: Payer: Self-pay | Admitting: Family Medicine

## 2019-08-15 ENCOUNTER — Ambulatory Visit: Payer: PPO | Admitting: Sports Medicine

## 2019-09-04 ENCOUNTER — Other Ambulatory Visit: Payer: Self-pay

## 2019-09-05 ENCOUNTER — Other Ambulatory Visit: Payer: Self-pay

## 2019-09-05 ENCOUNTER — Encounter: Payer: Self-pay | Admitting: Sports Medicine

## 2019-09-05 ENCOUNTER — Ambulatory Visit: Payer: PPO | Admitting: Sports Medicine

## 2019-09-05 DIAGNOSIS — I739 Peripheral vascular disease, unspecified: Secondary | ICD-10-CM | POA: Diagnosis not present

## 2019-09-05 DIAGNOSIS — B351 Tinea unguium: Secondary | ICD-10-CM

## 2019-09-05 DIAGNOSIS — M79674 Pain in right toe(s): Secondary | ICD-10-CM

## 2019-09-05 DIAGNOSIS — M79675 Pain in left toe(s): Secondary | ICD-10-CM

## 2019-09-05 NOTE — Progress Notes (Signed)
Subjective: Stacy Adams is a 79 y.o. female patient seen today in office with complaint of mildly painful thickened and elongated toenails; unable to trim.  Reports her husband used to trim them but they are getting thicker and very hard for him to do.  Patient denies history of Diabetes or neuropathy, but does admit to a history of vascular disease. Patient has no other pedal complaints at this time.   Review of Systems  All other systems reviewed and are negative.    Patient Active Problem List   Diagnosis Date Noted  . Acute cystitis with hematuria 07/08/2019  . Hyperkalemia 07/08/2019  . Angina pectoris ([place]) 07/08/2019  . Aphasia 06/13/2019  . 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 Impaired fasting glucose 05/30/2019  . Varicose veins of leg with pain, right 12/29/2015  . PAF (paroxysmal atrial fibrillation) post-CABG 04/02/2011  . S/P CABG x 4: CORONARY ARTERY BYPASS GRAFTING (CABG) X 4 (LIMA TO LAD,SVG to Diagonal,SVG to distal Circumflex, SVG to PDA 03/30/2011  . NSTEMI (non-ST elevated myocardial infarction) Urgent cath 03/25/2011    Class: Hospitalized for  . CAD, multiple vessel 03/25/2011  . ANXIETY 08/24/2007  . Hyperlipidemia with target LDL less than 70 12/21/2006  . Essential hypertension, benign 12/21/2006  . GERD 12/21/2006  . OSTEOPENIA 12/21/2006    Current Outpatient Medications on File Prior to Visit  Medication Sig Dispense Refill  . atorvastatin (LIPITOR) 40 MG tablet Take 40 mg by mouth at bedtime.    . clopidogrel (PLAVIX) 75 MG tablet Take 75 mg by mouth daily.    Marland Kitchen lisinopril (ZESTRIL) 5 MG tablet Take 5 mg by mouth daily.    . metoprolol tartrate (LOPRESSOR) 25 MG tablet Take 50 mg by mouth 2 (two) times daily.    . nitroGLYCERIN (NITROSTAT) 0.4 MG SL tablet Place 1 tablet (0.4 mg total) under the tongue every 5 (five) minutes as needed for chest pain. 50 tablet 3  . potassium chloride (MICRO-K) 10 MEQ CR capsule Take 1 capsule (10 mEq total) by  mouth 2 (two) times daily. 30 capsule 2  . potassium chloride SA (KLOR-CON) 20 MEQ tablet Take 1 tablet (20 mEq total) by mouth daily. 30 tablet 1   No current facility-administered medications on file prior to visit.    Allergies  Allergen Reactions  . Ciprofloxacin     Pruritis and related conditions, NOS  . Macrobid [Nitrofurantoin]     Abdominal pain  . Sulfa Antibiotics Other (See Comments)    Swelling years ago     Objective: Physical Exam  General: Well developed, nourished, no acute distress, awake, alert and oriented x 3  Vascular: Dorsalis pedis artery 1/4 bilateral, Posterior tibial artery 0/4 bilateral due to trace edema at ankles, skin temperature warm to warm proximal to distal bilateral lower extremities, mild varicosities, pedal hair present bilateral.  Neurological: Gross sensation present via light touch bilateral.   Dermatological: Skin is warm, dry, and supple bilateral, Nails 1-10 are tender, long, thick, and discolored with mild subungal debris, no webspace macerations present bilateral, no open lesions present bilateral, no callus/corns/hyperkeratotic tissue present bilateral. No signs of infection bilateral.  Musculoskeletal: Asymptomatic mild hammertoe boney deformities noted bilateral. Muscular strength within normal limits without painon range of motion. No pain with calf compression bilateral.  Assessment and Plan:  Problem List Items Addressed This Visit    None    Visit Diagnoses    Pain due to onychomycosis of toenails of both feet    -  Primary   PVD (peripheral vascular disease) ([place])          -Examined patient.  -Discussed treatment options for painful mycotic nails. -Mechanically debrided and reduced mycotic nails with sterile nail nipper and dremel nail file without incident. -Advised Vicks VapoRub for the thicker nails as needed -Patient to return in 3 months for follow up evaluation or sooner if symptoms worsen.  Landis Martins,  DPM

## 2019-09-09 DIAGNOSIS — Z961 Presence of intraocular lens: Secondary | ICD-10-CM | POA: Diagnosis not present

## 2019-09-09 DIAGNOSIS — H40011 Open angle with borderline findings, low risk, right eye: Secondary | ICD-10-CM | POA: Diagnosis not present

## 2019-09-09 DIAGNOSIS — H33052 Total retinal detachment, left eye: Secondary | ICD-10-CM | POA: Diagnosis not present

## 2019-09-09 DIAGNOSIS — H353211 Exudative age-related macular degeneration, right eye, with active choroidal neovascularization: Secondary | ICD-10-CM | POA: Diagnosis not present

## 2019-09-12 ENCOUNTER — Encounter: Payer: Self-pay | Admitting: Family Medicine

## 2019-09-12 ENCOUNTER — Ambulatory Visit (INDEPENDENT_AMBULATORY_CARE_PROVIDER_SITE_OTHER): Payer: PPO | Admitting: Family Medicine

## 2019-09-12 ENCOUNTER — Other Ambulatory Visit: Payer: Self-pay

## 2019-09-12 ENCOUNTER — Other Ambulatory Visit: Payer: Self-pay | Admitting: Family Medicine

## 2019-09-12 VITALS — BP 168/100 | HR 63 | Temp 97.1°F | Ht 60.0 in | Wt 131.0 lb

## 2019-09-12 DIAGNOSIS — E785 Hyperlipidemia, unspecified: Secondary | ICD-10-CM

## 2019-09-12 DIAGNOSIS — I2510 Atherosclerotic heart disease of native coronary artery without angina pectoris: Secondary | ICD-10-CM

## 2019-09-12 DIAGNOSIS — I10 Essential (primary) hypertension: Secondary | ICD-10-CM | POA: Diagnosis not present

## 2019-09-12 DIAGNOSIS — I119 Hypertensive heart disease without heart failure: Secondary | ICD-10-CM

## 2019-09-12 DIAGNOSIS — R7301 Impaired fasting glucose: Secondary | ICD-10-CM | POA: Diagnosis not present

## 2019-09-12 MED ORDER — LISINOPRIL 20 MG PO TABS: 20.0000 mg | ORAL_TABLET | Freq: Every day | ORAL | 0 refills | Status: DC

## 2019-09-12 NOTE — Progress Notes (Signed)
Subjective:  Patient ID: Stacy Adams, female    DOB: 1940-05-13  Age: 79 y.o. MRN: [id number]  Chief Complaint  Patient presents with  . Hyperlipidemia  . Hypertension   Patient is an 79 year old white female with history of hyperlipidemia, hypertension, CAD, and stroke with continued although improved aphasia who presents for follow-up today.   APHASIA:  The patient had a stroke in October 2020.  Since then her left-sided facial droop has improved although she continues to have aphasia.  The patient was started on Lipitor, Plavix, metoprolol and Zestril in October. Patient denies any chest pain or difficulty breathing.  She is not fasting today.  The patient reports eating healthy.  Hyperlipidemia - On lipitor 40 mg once daily at night. Eating healthy. Deneis chest pain. Denies myalgias.  Hypertension: Currently on metoprolol and zestril. Bp is quite high 168/100.   CAD: S/P CABG IN 02/2011. She had postop atrial fibrillation and has not had it since. She denies chest pain. She is on metoprolol and zestril, as well as lipitor and plavix.    Social History   Socioeconomic History  . Marital status: Married    Spouse name: Not on file  . Number of children: 2  . Years of education: Not on file  . Highest education level: Not on file  Occupational History  . Not on file  Tobacco Use  . Smoking status: Never Smoker  . Smokeless tobacco: Never Used  Vaping Use  . Vaping Use: Never used  Substance and Sexual Activity  . Alcohol use: No  . Drug use: No  . Sexual activity: Yes  Other Topics Concern  . Not on file  Social History Narrative   She is a married, mother of one was 1 grandchild that unfortunately died early. She is exercising anywhere from 15-60 minutes a day. She does not smoke or drink.   Social Determinants of Health   Financial Resource Strain:   . Difficulty of Paying Living Expenses:   Food Insecurity:   . Worried About Charity fundraiser in the Last  Year:   . Arboriculturist in the Last Year:   Transportation Needs:   . Film/video editor (Medical):   Marland Kitchen Lack of Transportation (Non-Medical):   Physical Activity:   . Days of Exercise per Week:   . Minutes of Exercise per Session:   Stress:   . Feeling of Stress :   Social Connections:   . Frequency of Communication with Friends and Family:   . Frequency of Social Gatherings with Friends and Family:   . Attends Religious Services:   . Active Member of Clubs or Organizations:   . Attends Archivist Meetings:   Marland Kitchen Marital Status:    Past Medical History:  Diagnosis Date  . Anxiety   . Aphasia following cerebral infarction   . Blindness left eye category 4, normal vision right eye   . CAD (coronary artery disease), native coronary artery 03/25/2011   80-90% LAD after D1 followed by 60-70% consecutive lesions at T2. Circumflex: 95% ostial, RCA 95-99% --> referred for CABG  . CAD, multiple vessel 08/24/2007  . CHF (congestive heart failure) ([place])   . GERD (gastroesophageal reflux disease)   . Hyperlipidemia 12/21/2006  . HYPERTENSION 12/21/2006  . Hypokalemia   . NSTEMI (non-ST elevated myocardial infarction) Urgent cath 03/25/2011   Multivessel CAD  . Osteopenia 12/21/2006  . PAF (paroxysmal atrial fibrillation) [facility]) January 2013   Postop A.  fib, no recurrence  . S/P CABG x 4 03/26/2011   LIMA-LAD, SVG-Diag, SVG-Cx-OM, SVG-RCA; Intra-Op TEE: EF 60-65%.  Marland Kitchen TIA (transient ischemic attack)    Family History  Problem Relation Age of Onset  . Alzheimer's disease Mother   . Alzheimer's disease Father   . Cancer Brother   . Lupus Brother     Review of Systems  Constitutional: Negative for chills, fatigue and fever.  HENT: Negative for congestion, ear pain and sore throat.   Respiratory: Negative for cough and shortness of breath.   Cardiovascular: Negative for chest pain.  Gastrointestinal: Negative for abdominal pain, constipation, diarrhea, nausea and  vomiting.  Genitourinary: Negative for dysuria and urgency.  Musculoskeletal: Negative for arthralgias and myalgias.  Neurological: Positive for speech difficulty. Negative for dizziness and facial asymmetry.  Psychiatric/Behavioral: Negative for dysphoric mood. The patient is not nervous/anxious.      Objective:  BP (!) 168/100   Pulse 63   Temp (!) 97.1 F (36.2 C)   Ht 5' (1.524 m)   Wt 131 lb (59.4 kg)   SpO2 97%   BMI 25.58 kg/m   BP/Weight 09/12/2019 9/39/0300 11/27/3298  Systolic BP 762 - 263  Diastolic BP 335 - 66  Wt. (Lbs) 131 128.2 127  BMI 25.58 23.45 24.8    Physical Exam Vitals reviewed.  Constitutional:      Appearance: Normal appearance.  Neck:     Vascular: No carotid bruit.  Cardiovascular:     Rate and Rhythm: Normal rate and regular rhythm.     Pulses: Normal pulses.     Heart sounds: Normal heart sounds.  Pulmonary:     Breath sounds: Normal breath sounds.  Abdominal:     General: Bowel sounds are normal.     Palpations: Abdomen is soft.     Tenderness: There is no abdominal tenderness.  Neurological:     Mental Status: She is alert.     Cranial Nerves: No cranial nerve deficit.     Comments: Speech is slurred. Understandable. Chooses wrong word at times.  Psychiatric:        Mood and Affect: Mood normal.        Behavior: Behavior normal.      Assessment & Plan:  1. CAD, multiple vessel The current medical regimen is effective;  continue present plan and medications. - Lipid panel; Future  2. Hypertensive heart disease without heart failure Poorly controlled.  Increase zestril 20 mg once daily. Follow up in 4 weeks.  - CBC with Differential/Platelet; Future - Comp. Metabolic Panel (12); Future  3. Impaired fasting glucose The current medical regimen is effective;  continue present plan and medications. - Hemoglobin A1c; Future  4. Sequelae of stroke, aphasia.  - Continue plavix and statin.      Follow-up: Return in about 4  weeks (around 10/10/2019) for To see Dr. Holly Bodily for hypertension follow up. .  AVS was given to patient prior to departure.  Rochel Brome [facility] [phone]

## 2019-09-12 NOTE — Patient Instructions (Signed)
Increase lisinopril to 20 mg once daily

## 2019-09-13 LAB — CBC WITH DIFFERENTIAL/PLATELET
Basophils Absolute: 0.1 10*3/uL (ref 0.0–0.2)
Basos: 1 %
EOS (ABSOLUTE): 0.3 10*3/uL (ref 0.0–0.4)
Eos: 3 %
Hematocrit: 43.3 % (ref 34.0–46.6)
Hemoglobin: 14.3 g/dL (ref 11.1–15.9)
Immature Grans (Abs): 0 10*3/uL (ref 0.0–0.1)
Immature Granulocytes: 0 %
Lymphocytes Absolute: 1.4 10*3/uL (ref 0.7–3.1)
Lymphs: 13 %
MCH: 29.3 pg (ref 26.6–33.0)
MCHC: 33 g/dL (ref 31.5–35.7)
MCV: 89 fL (ref 79–97)
Monocytes Absolute: 0.5 10*3/uL (ref 0.1–0.9)
Monocytes: 5 %
Neutrophils Absolute: 8.7 10*3/uL — ABNORMAL HIGH (ref 1.4–7.0)
Neutrophils: 78 %
Platelets: 224 10*3/uL (ref 150–450)
RBC: 4.88 x10E6/uL (ref 3.77–5.28)
RDW: 13.1 % (ref 11.7–15.4)
WBC: 11 10*3/uL — ABNORMAL HIGH (ref 3.4–10.8)

## 2019-09-13 LAB — COMPREHENSIVE METABOLIC PANEL
ALT: 22 IU/L (ref 0–32)
AST: 25 IU/L (ref 0–40)
Albumin/Globulin Ratio: 1.8 (ref 1.2–2.2)
Albumin: 4.6 g/dL (ref 3.7–4.7)
Alkaline Phosphatase: 116 IU/L (ref 48–121)
BUN/Creatinine Ratio: 14 (ref 12–28)
BUN: 13 mg/dL (ref 8–27)
Bilirubin Total: 0.8 mg/dL (ref 0.0–1.2)
CO2: 29 mmol/L (ref 20–29)
Calcium: 9.9 mg/dL (ref 8.7–10.3)
Chloride: 97 mmol/L (ref 96–106)
Creatinine, Ser: 0.92 mg/dL (ref 0.57–1.00)
GFR calc Af Amer: 68 mL/min/{1.73_m2} (ref >59)
GFR calc non Af Amer: 59 mL/min/{1.73_m2} — ABNORMAL LOW (ref >59)
Globulin, Total: 2.6 g/dL (ref 1.5–4.5)
Glucose: 98 mg/dL (ref 65–99)
Potassium: 3.9 mmol/L (ref 3.5–5.2)
Sodium: 142 mmol/L (ref 134–144)
Total Protein: 7.2 g/dL (ref 6.0–8.5)

## 2019-09-13 LAB — HEMOGLOBIN A1C
Est. average glucose Bld gHb Est-mCnc: 114 mg/dL
Hgb A1c MFr Bld: 5.6 % (ref 4.8–5.6)

## 2019-09-13 LAB — LIPID PANEL
Chol/HDL Ratio: 4.1 ratio (ref 0.0–4.4)
Cholesterol, Total: 183 mg/dL (ref 100–199)
HDL: 45 mg/dL (ref >39)
LDL Chol Calc (NIH): 100 mg/dL — ABNORMAL HIGH (ref 0–99)
Triglycerides: 222 mg/dL — ABNORMAL HIGH (ref 0–149)
VLDL Cholesterol Cal: 38 mg/dL (ref 5–40)

## 2019-09-13 LAB — CARDIOVASCULAR RISK ASSESSMENT

## 2019-09-25 DIAGNOSIS — T85398A Other mechanical complication of other ocular prosthetic devices, implants and grafts, initial encounter: Secondary | ICD-10-CM | POA: Diagnosis not present

## 2019-09-25 DIAGNOSIS — H353211 Exudative age-related macular degeneration, right eye, with active choroidal neovascularization: Secondary | ICD-10-CM | POA: Diagnosis not present

## 2019-09-25 DIAGNOSIS — H43811 Vitreous degeneration, right eye: Secondary | ICD-10-CM | POA: Diagnosis not present

## 2019-09-25 DIAGNOSIS — H26491 Other secondary cataract, right eye: Secondary | ICD-10-CM | POA: Diagnosis not present

## 2019-10-10 ENCOUNTER — Ambulatory Visit (INDEPENDENT_AMBULATORY_CARE_PROVIDER_SITE_OTHER): Payer: PPO | Admitting: Family Medicine

## 2019-10-10 ENCOUNTER — Encounter: Payer: Self-pay | Admitting: Family Medicine

## 2019-10-10 ENCOUNTER — Other Ambulatory Visit: Payer: Self-pay

## 2019-10-10 VITALS — BP 140/80 | HR 65 | Temp 97.2°F | Ht 60.0 in | Wt 132.0 lb

## 2019-10-10 DIAGNOSIS — I2510 Atherosclerotic heart disease of native coronary artery without angina pectoris: Secondary | ICD-10-CM

## 2019-10-10 DIAGNOSIS — I10 Essential (primary) hypertension: Secondary | ICD-10-CM

## 2019-10-10 MED ORDER — LISINOPRIL 20 MG PO TABS: 20.0000 mg | ORAL_TABLET | Freq: Every day | ORAL | 0 refills | Status: DC

## 2019-10-10 NOTE — Progress Notes (Signed)
Subjective:  Patient ID: Stacy Adams, female    DOB: 11-Jun-1940  Age: 79 y.o. MRN: [id number]  Chief Complaint  Patient presents with  . Hypertension    Strated Zestril 20 mg gd   Patient is an 79 year old white female with history of HTN   Hypertension: Currently on metoprolol and zestril. Bp was running high 168/100 at June appt,  Zestril increased to 20 mg qd which is helping keep bp in control. No headaches.  CAD: S/P CABG IN 02/2011. She had postop atrial fibrillation and has not had it since. She denies chest pain.  On plavix.   Hyperlipidemia-LDL 192, TC 183-lipitor   Social History   Socioeconomic History  . Marital status: Married    Spouse name: Not on file  . Number of children: 2  . Years of education: Not on file  . Highest education level: Not on file  Occupational History  . Not on file  Tobacco Use  . Smoking status: Never Smoker  . Smokeless tobacco: Never Used  Vaping Use  . Vaping Use: Never used  Substance and Sexual Activity  . Alcohol use: No  . Drug use: No  . Sexual activity: Yes  Other Topics Concern  . Not on file  Social History Narrative   She is a married, mother of one was 1 grandchild that unfortunately died early. She is exercising anywhere from 15-60 minutes a day. She does not smoke or drink.   Social Determinants of Health   Financial Resource Strain:   . Difficulty of Paying Living Expenses:   Food Insecurity:   . Worried About Charity fundraiser in the Last Year:   . Arboriculturist in the Last Year:   Transportation Needs:   . Film/video editor (Medical):   Marland Kitchen Lack of Transportation (Non-Medical):   Physical Activity:   . Days of Exercise per Week:   . Minutes of Exercise per Session:   Stress:   . Feeling of Stress :   Social Connections:   . Frequency of Communication with Friends and Family:   . Frequency of Social Gatherings with Friends and Family:   . Attends Religious Services:   . Active Member of  Clubs or Organizations:   . Attends Archivist Meetings:   Marland Kitchen Marital Status:    Past Medical History:  Diagnosis Date  . Anxiety   . Aphasia following cerebral infarction   . Blindness left eye category 4, normal vision right eye   . CAD (coronary artery disease), native coronary artery 03/25/2011   80-90% LAD after D1 followed by 60-70% consecutive lesions at T2. Circumflex: 95% ostial, RCA 95-99% --> referred for CABG  . CAD, multiple vessel 08/24/2007  . CHF (congestive heart failure) ([place])   . GERD (gastroesophageal reflux disease)   . Hyperlipidemia 12/21/2006  . HYPERTENSION 12/21/2006  . Hypokalemia   . NSTEMI (non-ST elevated myocardial infarction) Urgent cath 03/25/2011   Multivessel CAD  . Osteopenia 12/21/2006  . PAF (paroxysmal atrial fibrillation) [facility]) January 2013   Postop A. fib, no recurrence  . S/P CABG x 4 03/26/2011   LIMA-LAD, SVG-Diag, SVG-Cx-OM, SVG-RCA; Intra-Op TEE: EF 60-65%.  Marland Kitchen TIA (transient ischemic attack)    Family History  Problem Relation Age of Onset  . Alzheimer's disease Mother   . Alzheimer's disease Father   . Cancer Brother   . Lupus Brother     Review of Systems  Constitutional: Negative for chills, fatigue and  fever.  HENT: Negative for congestion, ear pain and sore throat.   Respiratory: Negative for cough and shortness of breath.   Cardiovascular: Negative for chest pain.  Gastrointestinal: Negative for abdominal pain, constipation, diarrhea, nausea and vomiting.  Genitourinary: Negative for dysuria and urgency.  Musculoskeletal: Negative for arthralgias and myalgias.  Neurological: Negative for dizziness, facial asymmetry and speech difficulty.  Psychiatric/Behavioral: Negative for dysphoric mood. The patient is not nervous/anxious.      Objective:  BP 140/80   Pulse 65   Temp (!) 97.2 F (36.2 C)   Ht 5' (1.524 m)   Wt 132 lb (59.9 kg)   SpO2 99%   BMI 25.78 kg/m   BP/Weight 10/10/2019 09/12/2019 0/31/2811    Systolic BP 886 773 -  Diastolic BP 80 736 -  Wt. (Lbs) 132 131 128.2  BMI 25.78 25.58 23.45    Physical Exam Vitals reviewed.  Constitutional:      Appearance: Normal appearance.  Neck:     Vascular: No carotid bruit.  Cardiovascular:     Rate and Rhythm: Normal rate and regular rhythm.     Pulses: Normal pulses.     Heart sounds: Normal heart sounds.  Pulmonary:     Breath sounds: Normal breath sounds.  Abdominal:     General: Bowel sounds are normal.     Palpations: Abdomen is soft.     Tenderness: There is no abdominal tenderness.  Neurological:     Mental Status: She is alert.     Cranial Nerves: No cranial nerve deficit.  Psychiatric:        Mood and Affect: Mood normal.        Behavior: Behavior normal.      Assessment & Plan:  1. CAD, multiple vessel Continue plavix, Zestril and lipitor-add Vascepa -Triglycerides 222 2. Hypertensive heart disease without heart failure zestril 20 mg once daily.Improved control-will check bmp for renal function and Potassium   Follow-up:  AVS was given to patient prior to departure.  [place] [phone]

## 2019-10-10 NOTE — Patient Instructions (Addendum)
Lab work done to day to check your kidney function and potassium level. Will call with results. Provided patient with 2 boxes of Vascepa. Take 2 gm twice a daily. If you take over the counter fish oil you need OMEGA 3- (1 gm /day)

## 2019-10-11 LAB — BASIC METABOLIC PANEL
BUN/Creatinine Ratio: 14 (ref 12–28)
BUN: 14 mg/dL (ref 8–27)
CO2: 28 mmol/L (ref 20–29)
Calcium: 9.7 mg/dL (ref 8.7–10.3)
Chloride: 101 mmol/L (ref 96–106)
Creatinine, Ser: 0.98 mg/dL (ref 0.57–1.00)
GFR calc Af Amer: 63 mL/min/{1.73_m2} (ref >59)
GFR calc non Af Amer: 55 mL/min/{1.73_m2} — ABNORMAL LOW (ref >59)
Glucose: 86 mg/dL (ref 65–99)
Potassium: 3.5 mmol/L (ref 3.5–5.2)
Sodium: 144 mmol/L (ref 134–144)

## 2019-10-14 ENCOUNTER — Telehealth: Payer: Self-pay

## 2019-10-14 ENCOUNTER — Other Ambulatory Visit: Payer: Self-pay

## 2019-10-14 DIAGNOSIS — E875 Hyperkalemia: Secondary | ICD-10-CM

## 2019-10-14 NOTE — Telephone Encounter (Signed)
Attempted to submitted PA for Vascepa via covermymeds. PA is not required, no authorization is needed.

## 2019-10-14 NOTE — Telephone Encounter (Signed)
Mr. Novak called to report that his wife is doing well with the Vascepa and he is requesting a prescription be sent to [facility] [place].  Dr. Holly Bodily advised that she continue potassium 10 meq 1 daily.

## 2019-10-16 MED ORDER — ICOSAPENT ETHYL 1 G PO CAPS: 2.0000 g | ORAL_CAPSULE | Freq: Two times a day (BID) | ORAL | 1 refills | Status: DC

## 2019-10-16 MED ORDER — POTASSIUM CHLORIDE ER 10 MEQ PO CPCR: 10.0000 meq | ORAL_CAPSULE | Freq: Every day | ORAL | 1 refills | Status: DC

## 2019-12-12 ENCOUNTER — Other Ambulatory Visit: Payer: Self-pay | Admitting: Family Medicine

## 2019-12-15 IMAGING — CT CT ANGIO HEAD
1 of 8 series · 7 of 47 positions shown · IV contrast (Omni 300)
Comparison: Head CT 01/25/2018

CLINICAL DATA: Intermittent headaches for 1 month.

EXAM:
CT ANGIOGRAPHY HEAD
TECHNIQUE: Multidetector CT imaging of the head was performed using the
standard protocol during bolus administration of intravenous
contrast. Multiplanar CT image reconstructions and MIPs were
obtained to evaluate the vascular anatomy.
CONTRAST:  50mL GDPTS2-XTE IOPAMIDOL (GDPTS2-XTE) INJECTION 76%

[Series 5: cow 2.0 · axial · 0.39mm/px · z∈[+1313,+1433]mm · 7 of 80 slices shown]
[im 10/80  brain]
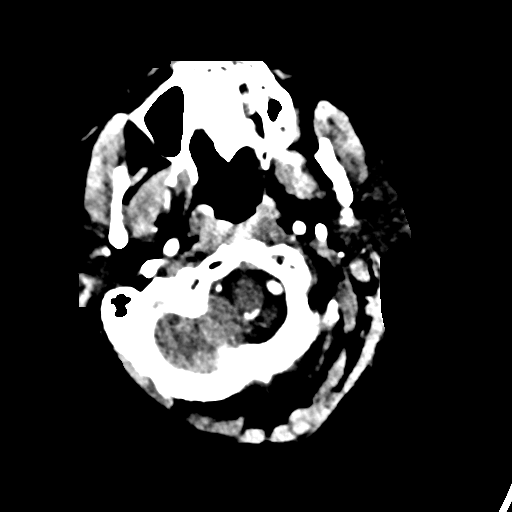
[im 20/80  bone]
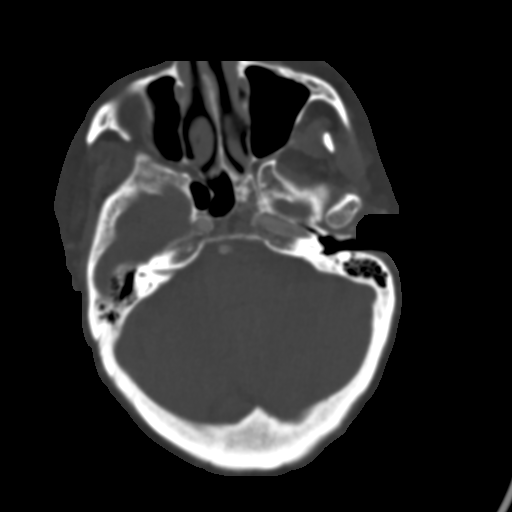
[im 30/80  brain]
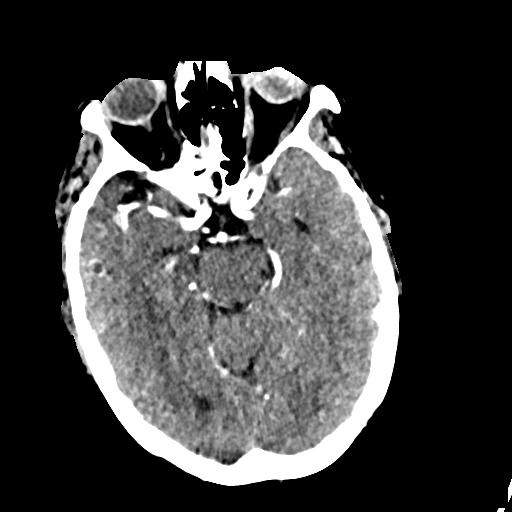
[im 40/80  bone]
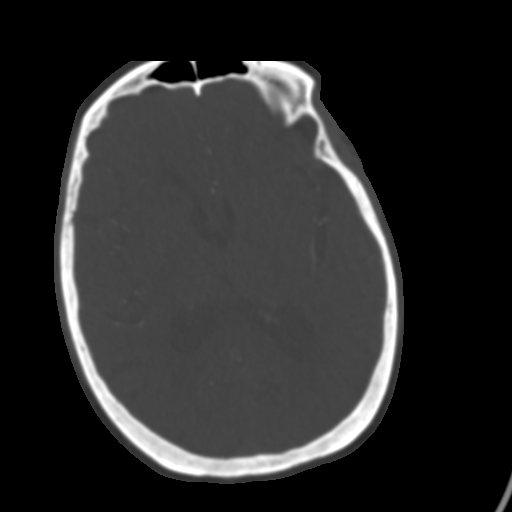
[im 50/80  brain]
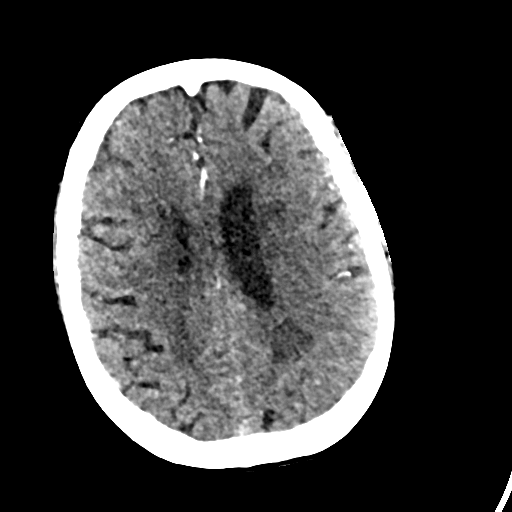
[im 60/80  bone]
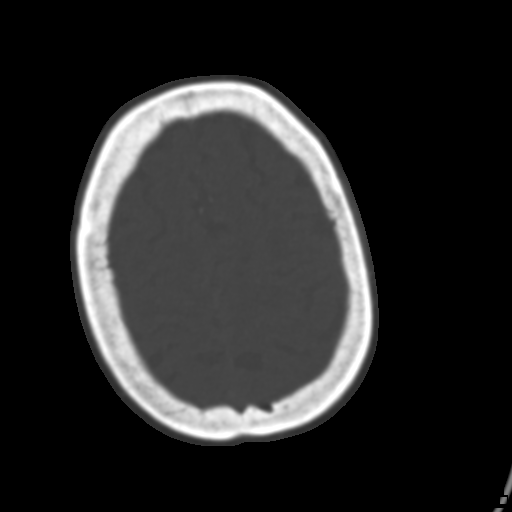
[im 70/80  brain]
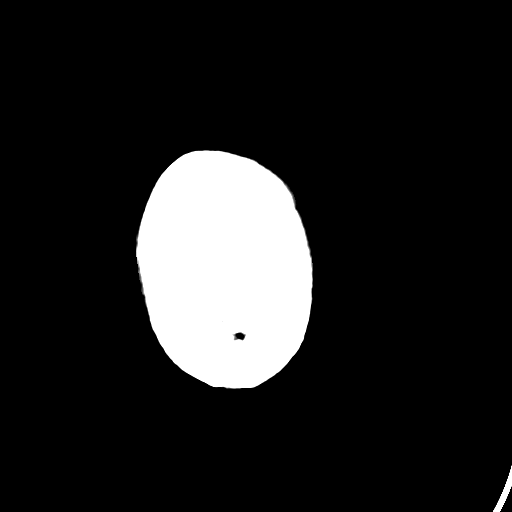

[7 of 47 positions shown; findings below may reference images not displayed]

FINDINGS: CTA HEAD FINDINGS

ANTERIOR CIRCULATION:

--Intracranial internal carotid arteries: Atherosclerotic
calcification of the internal carotid arteries at the skull base
without hemodynamically significant stenosis.

--Anterior cerebral arteries: Normal. Both A1 segments are present.
Patent anterior communicating artery.

--Middle cerebral arteries: Normal.

--Posterior communicating arteries: Present on the right, absent on
the left.

POSTERIOR CIRCULATION:

--Basilar artery: Normal.

--Posterior cerebral arteries: Normal.

--Superior cerebellar arteries: Normal.

--Inferior cerebellar arteries: Normal posterior inferior cerebellar
arteries. Anterior inferior cerebellar arteries are not clearly
visible, but this is not uncommon.

VENOUS SINUSES: As permitted by contrast timing, patent.

ANATOMIC VARIANTS: None

DELAYED PHASE: No parenchymal contrast enhancement.

Review of the MIP images confirms the above findings.
IMPRESSION: No intracranial occlusion or hemodynamically significant stenosis.
No aneurysm.

## 2019-12-17 ENCOUNTER — Telehealth: Payer: Self-pay | Admitting: Family Medicine

## 2019-12-17 ENCOUNTER — Other Ambulatory Visit: Payer: Self-pay

## 2019-12-17 ENCOUNTER — Encounter: Payer: Self-pay | Admitting: Family Medicine

## 2019-12-17 ENCOUNTER — Ambulatory Visit (INDEPENDENT_AMBULATORY_CARE_PROVIDER_SITE_OTHER): Payer: PPO | Admitting: Family Medicine

## 2019-12-17 VITALS — BP 136/80 | HR 60 | Temp 96.9°F | Resp 14 | Ht 60.0 in | Wt 132.6 lb

## 2019-12-17 DIAGNOSIS — R7301 Impaired fasting glucose: Secondary | ICD-10-CM | POA: Diagnosis not present

## 2019-12-17 DIAGNOSIS — E782 Mixed hyperlipidemia: Secondary | ICD-10-CM

## 2019-12-17 DIAGNOSIS — R4701 Aphasia: Secondary | ICD-10-CM | POA: Diagnosis not present

## 2019-12-17 DIAGNOSIS — I6930 Unspecified sequelae of cerebral infarction: Secondary | ICD-10-CM | POA: Diagnosis not present

## 2019-12-17 DIAGNOSIS — I2510 Atherosclerotic heart disease of native coronary artery without angina pectoris: Secondary | ICD-10-CM | POA: Diagnosis not present

## 2019-12-17 DIAGNOSIS — I119 Hypertensive heart disease without heart failure: Secondary | ICD-10-CM

## 2019-12-17 DIAGNOSIS — Z23 Encounter for immunization: Secondary | ICD-10-CM | POA: Diagnosis not present

## 2019-12-17 MED ORDER — VASCEPA 0.5 G PO CAPS: 2.0000 | ORAL_CAPSULE | Freq: Two times a day (BID) | ORAL | 0 refills | Status: DC

## 2019-12-17 NOTE — Chronic Care Management (AMB) (Signed)
  Chronic Care Management   Note  12/17/2019 Name: DELORIS MOGER MRN: [id number] DOB: 1940-04-23  Theone Murdoch Victor is a 79 y.o. year old female who is a primary care patient of Cox, Kirsten, MD. I reached out to [employer] by phone today in response to a referral sent by Ms. Maurissa H Kroon's PCP, Cox, Kirsten, MD.   Ms. Hiraldo was given information about Chronic Care Management services today including:  1. CCM service includes personalized support from designated clinical staff supervised by her physician, including individualized plan of care and coordination with other care providers 2. 24/7 contact phone numbers for assistance for urgent and routine care needs. 3. Service will only be billed when office clinical staff spend 20 minutes or more in a month to coordinate care. 4. Only one practitioner may furnish and bill the service in a calendar month. 5. The patient may stop CCM services at any time (effective at the end of the month) by phone call to the office staff.   Patient agreed to services and verbal consent obtained.   Follow up plan:   [place]

## 2019-12-17 NOTE — Progress Notes (Signed)
Subjective:  Patient ID: Stacy Adams, female    DOB: 10-10-1940  Age: 79 y.o. MRN: [id number]  Chief Complaint  Patient presents with  . Hypertension   Patient is an 79 year old white female with history of hyperlipidemia, hypertension, CAD, and stroke with continued although improved aphasia who presents for follow-up today.   APHASIA:  The patient had a stroke in October 2020.  Since then her left-sided facial droop has improved although she continues to have aphasia.  The patient was started on Lipitor, Plavix, metoprolol and Zestril in October. Patient denies any chest pain or difficulty breathing.  She is not fasting today.  The patient reports eating healthy.  Hyperlipidemia - On lipitor 40 mg once daily at night. Eating healthy. Walks for exercise.Deneis chest pain. Denies myalgias.  Hypertension: Currently on metoprolol and zestril. Bp 136/80  CAD: S/P CABG IN 02/2011. She had postop atrial fibrillation and has not had it since. She denies chest pain. She is on metoprolol and zestril, as well as lipitor and plavix.    Social History   Socioeconomic History  . Marital status: Married    Spouse name: Not on file  . Number of children: 2  . Years of education: Not on file  . Highest education level: Not on file  Occupational History  . Not on file  Tobacco Use  . Smoking status: Never Smoker  . Smokeless tobacco: Never Used  Vaping Use  . Vaping Use: Never used  Substance and Sexual Activity  . Alcohol use: No  . Drug use: No  . Sexual activity: Yes  Other Topics Concern  . Not on file  Social History Narrative   She is a married, mother of one was 1 grandchild that unfortunately died early. She is exercising anywhere from 15-60 minutes a day. She does not smoke or drink.   Social Determinants of Health   Financial Resource Strain:   . Difficulty of Paying Living Expenses: Not on file  Food Insecurity:   . Worried About Charity fundraiser in the Last Year:  Not on file  . Ran Out of Food in the Last Year: Not on file  Transportation Needs:   . Lack of Transportation (Medical): Not on file  . Lack of Transportation (Non-Medical): Not on file  Physical Activity:   . Days of Exercise per Week: Not on file  . Minutes of Exercise per Session: Not on file  Stress:   . Feeling of Stress : Not on file  Social Connections:   . Frequency of Communication with Friends and Family: Not on file  . Frequency of Social Gatherings with Friends and Family: Not on file  . Attends Religious Services: Not on file  . Active Member of Clubs or Organizations: Not on file  . Attends Archivist Meetings: Not on file  . Marital Status: Not on file   Past Medical History:  Diagnosis Date  . Anxiety   . Aphasia following cerebral infarction   . Blindness left eye category 4, normal vision right eye   . CAD (coronary artery disease), native coronary artery 03/25/2011   80-90% LAD after D1 followed by 60-70% consecutive lesions at T2. Circumflex: 95% ostial, RCA 95-99% --> referred for CABG  . CAD, multiple vessel 08/24/2007  . CHF (congestive heart failure) ([place])   . GERD (gastroesophageal reflux disease)   . Hyperlipidemia 12/21/2006  . HYPERTENSION 12/21/2006  . Hypokalemia   . NSTEMI (non-ST elevated myocardial infarction) Urgent  cath 03/25/2011   Multivessel CAD  . Osteopenia 12/21/2006  . PAF (paroxysmal atrial fibrillation) [facility]) January 2013   Postop A. fib, no recurrence  . S/P CABG x 4 03/26/2011   LIMA-LAD, SVG-Diag, SVG-Cx-OM, SVG-RCA; Intra-Op TEE: EF 60-65%.  Marland Kitchen TIA (transient ischemic attack)    Family History  Problem Relation Age of Onset  . Alzheimer's disease Mother   . Alzheimer's disease Father   . Cancer Brother   . Lupus Brother     Review of Systems  Constitutional: Negative for chills, fatigue and fever.  HENT: Negative for congestion, ear pain and sore throat.   Respiratory: Negative for cough and shortness of breath.    Cardiovascular: Negative for chest pain.  Gastrointestinal: Negative for abdominal pain, constipation, diarrhea, nausea and vomiting.  Genitourinary: Negative for dysuria and urgency.  Musculoskeletal: Negative for arthralgias and myalgias.  Neurological: Positive for speech difficulty. Negative for dizziness and facial asymmetry.  Psychiatric/Behavioral: Negative for dysphoric mood. The patient is not nervous/anxious.      Objective:  BP 136/80   Pulse 60   Temp (!) 96.9 F (36.1 C)   Resp 14   Ht 5' (1.524 m)   Wt 132 lb 9.6 oz (60.1 kg)   BMI 25.90 kg/m   BP/Weight 12/17/2019 10/10/2019 11/04/9831  Systolic BP 825 053 976  Diastolic BP 80 80 734  Wt. (Lbs) 132.6 132 131  BMI 25.9 25.78 25.58    Physical Exam Vitals reviewed.  Constitutional:      Appearance: Normal appearance.  Neck:     Vascular: No carotid bruit.  Cardiovascular:     Rate and Rhythm: Normal rate and regular rhythm.     Pulses: Normal pulses.     Heart sounds: Normal heart sounds.  Pulmonary:     Breath sounds: Normal breath sounds.  Abdominal:     General: Bowel sounds are normal.     Palpations: Abdomen is soft.     Tenderness: There is no abdominal tenderness.  Neurological:     Mental Status: She is alert.     Cranial Nerves: No cranial nerve deficit.     Comments: Speech is slurred. Understandable. Chooses wrong word at times.  Psychiatric:        Mood and Affect: Mood normal.        Behavior: Behavior normal.      Assessment & Plan:  1. CAD, multiple vessel The current medical regimen is effective;  continue present plan and medications. - Lipid panel; Future  2. Hypertensive heart disease without heart failure Well controlled.  No changes to medicines.  Continue to work on eating a healthy diet and exercise.  Labs drawn today.  - CBC with Differential/Platelet; Future - Comp. Metabolic Panel (12); Future  3. Impaired fasting glucose The current medical regimen is  effective;  continue present plan and medications.  4. Sequelae of stroke, aphasia.  - Continue plavix and statin.    5. Need for immunization against influenza - Flu Vaccine QUAD High Dose(Fluad)  6. Aphasia The current medical regimen is effective;  continue present plan and medications.  7. Mixed hyperlipidemia Await labs.  Continue to work on eating a healthy diet and exercise.  Labs drawn today.  - Lipid panel  Follow-up: No follow-ups on file.  AVS was given to patient prior to departure.  Rochel Brome [facility] [phone]

## 2019-12-18 LAB — CBC WITH DIFFERENTIAL/PLATELET
Basophils Absolute: 0.1 10*3/uL (ref 0.0–0.2)
Basos: 1 %
EOS (ABSOLUTE): 0.2 10*3/uL (ref 0.0–0.4)
Eos: 2 %
Hematocrit: 39.5 % (ref 34.0–46.6)
Hemoglobin: 13.6 g/dL (ref 11.1–15.9)
Immature Grans (Abs): 0 10*3/uL (ref 0.0–0.1)
Immature Granulocytes: 0 %
Lymphocytes Absolute: 1.5 10*3/uL (ref 0.7–3.1)
Lymphs: 14 %
MCH: 29.7 pg (ref 26.6–33.0)
MCHC: 34.4 g/dL (ref 31.5–35.7)
MCV: 86 fL (ref 79–97)
Monocytes Absolute: 0.6 10*3/uL (ref 0.1–0.9)
Monocytes: 6 %
Neutrophils Absolute: 8.1 10*3/uL — ABNORMAL HIGH (ref 1.4–7.0)
Neutrophils: 77 %
Platelets: 216 10*3/uL (ref 150–450)
RBC: 4.58 x10E6/uL (ref 3.77–5.28)
RDW: 13.5 % (ref 11.7–15.4)
WBC: 10.4 10*3/uL (ref 3.4–10.8)

## 2019-12-18 LAB — LIPID PANEL
Chol/HDL Ratio: 4.3 ratio (ref 0.0–4.4)
Cholesterol, Total: 159 mg/dL (ref 100–199)
HDL: 37 mg/dL — ABNORMAL LOW (ref >39)
LDL Chol Calc (NIH): 96 mg/dL (ref 0–99)
Triglycerides: 149 mg/dL (ref 0–149)
VLDL Cholesterol Cal: 26 mg/dL (ref 5–40)

## 2019-12-18 LAB — COMPREHENSIVE METABOLIC PANEL
ALT: 16 IU/L (ref 0–32)
AST: 18 IU/L (ref 0–40)
Albumin/Globulin Ratio: 2.2 (ref 1.2–2.2)
Albumin: 4.6 g/dL (ref 3.7–4.7)
Alkaline Phosphatase: 84 IU/L (ref 44–121)
BUN/Creatinine Ratio: 24 (ref 12–28)
BUN: 21 mg/dL (ref 8–27)
Bilirubin Total: 0.8 mg/dL (ref 0.0–1.2)
CO2: 28 mmol/L (ref 20–29)
Calcium: 9.6 mg/dL (ref 8.7–10.3)
Chloride: 102 mmol/L (ref 96–106)
Creatinine, Ser: 0.89 mg/dL (ref 0.57–1.00)
GFR calc Af Amer: 71 mL/min/{1.73_m2} (ref >59)
GFR calc non Af Amer: 61 mL/min/{1.73_m2} (ref >59)
Globulin, Total: 2.1 g/dL (ref 1.5–4.5)
Glucose: 107 mg/dL — ABNORMAL HIGH (ref 65–99)
Potassium: 4.1 mmol/L (ref 3.5–5.2)
Sodium: 142 mmol/L (ref 134–144)
Total Protein: 6.7 g/dL (ref 6.0–8.5)

## 2019-12-18 LAB — CARDIOVASCULAR RISK ASSESSMENT

## 2019-12-26 ENCOUNTER — Ambulatory Visit (INDEPENDENT_AMBULATORY_CARE_PROVIDER_SITE_OTHER): Payer: PPO | Admitting: Podiatry

## 2019-12-26 ENCOUNTER — Encounter: Payer: Self-pay | Admitting: Podiatry

## 2019-12-26 ENCOUNTER — Other Ambulatory Visit: Payer: Self-pay

## 2019-12-26 DIAGNOSIS — I739 Peripheral vascular disease, unspecified: Secondary | ICD-10-CM | POA: Diagnosis not present

## 2019-12-26 DIAGNOSIS — M79674 Pain in right toe(s): Secondary | ICD-10-CM | POA: Diagnosis not present

## 2019-12-26 DIAGNOSIS — M79675 Pain in left toe(s): Secondary | ICD-10-CM

## 2019-12-26 DIAGNOSIS — B351 Tinea unguium: Secondary | ICD-10-CM

## 2019-12-26 NOTE — Progress Notes (Signed)
Subjective:  Patient ID: Stacy Adams, female    DOB: 06-04-1940,  MRN: [id number]  79 y.o. female presents with for at risk foot care. Patient has h/o PAD and painful thick toenails that are difficult to trim. Pain interferes with ambulation. Aggravating factors include wearing enclosed shoe gear. Pain is relieved with periodic professional debridement.   Husband is present during today's visit.  They voiced no new pedal problems on today's visit.  Review of Systems: Negative except as noted in the HPI.  Past Medical History:  Diagnosis Date   Anxiety    Aphasia following cerebral infarction    Blindness left eye category 4, normal vision right eye    CAD (coronary artery disease), native coronary artery 03/25/2011   80-90% LAD after D1 followed by 60-70% consecutive lesions at T2. Circumflex: 95% ostial, RCA 95-99% --> referred for CABG   CAD, multiple vessel 08/24/2007   CHF (congestive heart failure) (HCC)    GERD (gastroesophageal reflux disease)    Hyperlipidemia 12/21/2006   HYPERTENSION 12/21/2006   Hypokalemia    NSTEMI (non-ST elevated myocardial infarction) Urgent cath 03/25/2011   Multivessel CAD   Osteopenia 12/21/2006   PAF (paroxysmal atrial fibrillation) ([place]) January 2013   Postop A. fib, no recurrence   S/P CABG x 4 03/26/2011   LIMA-LAD, SVG-Diag, SVG-Cx-OM, SVG-RCA; Intra-Op TEE: EF 60-65%.   TIA (transient ischemic attack)    Past Surgical History:  Procedure Laterality Date   ABDOMINAL HYSTERECTOMY     CARDIAC CATHETERIZATION  03/25/2011   EF 60 is 55%.80-90% LAD after D1 followed by 60-70% consecutive lesions at D2. Circumflex: 95% ostial, RCA 95-99% --> referred for CABG   CORONARY ARTERY BYPASS GRAFT  03/26/2011   Procedure: CORONARY ARTERY BYPASS GRAFTING (CABG);  Surgeon: Grace Isaac, MD;  Location: [facility];  Service: Open Heart Surgery;  Laterality: N/A;   LEFT HEART CATHETERIZATION WITH CORONARY ANGIOGRAM N/A 03/25/2011    Procedure: LEFT HEART CATHETERIZATION WITH CORONARY ANGIOGRAM;  Surgeon: Leonie Man, MD;  Location: [facility] CATH LAB;  Service: Cardiovascular;  Laterality: N/A;   Patient Active Problem List   Diagnosis Date Noted   Acute cystitis with hematuria 07/08/2019   Hyperkalemia 07/08/2019   Angina pectoris ([place]) 07/08/2019   Aphasia 06/13/2019   Sequelae, post-stroke 06/13/2019   Impaired fasting glucose 05/30/2019   Varicose veins of leg with pain, right 12/29/2015   PAF (paroxysmal atrial fibrillation) post-CABG 04/02/2011   S/P CABG x 4: CORONARY ARTERY BYPASS GRAFTING (CABG) X 4 (LIMA TO LAD,SVG to Diagonal,SVG to distal Circumflex, SVG to PDA 03/30/2011   NSTEMI (non-ST elevated myocardial infarction) Urgent cath 03/25/2011    Class: Hospitalized for   CAD, multiple vessel 03/25/2011   ANXIETY 08/24/2007   Hyperlipidemia with target LDL less than 70 12/21/2006   Essential hypertension, benign 12/21/2006   GERD 12/21/2006   OSTEOPENIA 12/21/2006    Current Outpatient Medications:    atorvastatin (LIPITOR) 40 MG tablet, TAKE ONE TABLET BY MOUTH EVERY EVENING, Disp: 90 tablet, Rfl: 1   clopidogrel (PLAVIX) 75 MG tablet, TAKE ONE TABLET BY MOUTH ONCE DAILY, Disp: 90 tablet, Rfl: 1   lisinopril (ZESTRIL) 20 MG tablet, Take 1 tablet (20 mg total) by mouth daily., Disp: 90 tablet, Rfl: 0   metoprolol tartrate (LOPRESSOR) 50 MG tablet, TAKE TWO TABLETS BY MOUTH EVERY MORNING AND TAKE ONE TABLET EVERY EVENING., Disp: 270 tablet, Rfl: 0   nitroGLYCERIN (NITROSTAT) 0.4 MG SL tablet, Place 1 tablet (0.4 mg total) under  the tongue every 5 (five) minutes as needed for chest pain., Disp: 50 tablet, Rfl: 3   Omega-3 Fatty Acids (FISH OIL) 1000 MG CPDR, Take 1,000 mg by mouth daily., Disp: , Rfl:  Allergies  Allergen Reactions   Ciprofloxacin     Pruritis and related conditions, NOS   Macrobid [Nitrofurantoin]     Abdominal pain   Sulfa Antibiotics Other (See Comments)     Swelling years ago    Social History   Occupational History   Not on file  Tobacco Use   Smoking status: Never Smoker   Smokeless tobacco: Never Used  Vaping Use   Vaping Use: Never used  Substance and Sexual Activity   Alcohol use: No   Drug use: No   Sexual activity: Yes    Objective:   Constitutional Pt is a pleasant 79 y.o. Caucasian female in NAD.Marland Kitchen AAO x 3.   Vascular Capillary fill time to digits <3 seconds b/l lower extremities. Faintly palpable DP pulse(s) b/l lower extremities. Nonpalpable PT pulse(s) b/l lower extremities. Pedal hair sparse. Lower extremity skin temperature gradient within normal limits. Varicosities present b/l. No cyanosis or clubbing noted.  Neurologic Normal speech. Oriented to person, place, and time. Protective sensation intact 5/5 intact bilaterally with 10g monofilament b/l.  Dermatologic Pedal skin with normal turgor, texture and tone bilaterally. No open wounds bilaterally. No interdigital macerations bilaterally. Toenails 1-5 b/l elongated, discolored, dystrophic, thickened, crumbly with subungual debris and tenderness to dorsal palpation.  Orthopedic: Normal muscle strength 5/5 to all lower extremity muscle groups bilaterally. No pain crepitus or joint limitation noted with ROM b/l. Hammertoes noted to the b/l lower extremities.   Radiographs: None Assessment:   1. Pain due to onychomycosis of toenails of both feet   2. PVD (peripheral vascular disease) ([place])    Plan:  Patient was evaluated and treated and all questions answered.  Onychomycosis with pain -Nails palliatively debridement as below. -Educated on self-care  Procedure: Nail Debridement Rationale: Pain Type of Debridement: manual, sharp debridement. Instrumentation: Nail nipper, rotary burr. Number of Nails: 10  -Examined patient. -Toenails 1-5 b/l were debrided in length and girth with sterile nail nippers and dremel without iatrogenic bleeding.  -Patient to  report any pedal injuries to medical professional immediately. -Patient to continue soft, supportive shoe gear daily. -Patient/POA to call should there be question/concern in the interim.  Return in about 3 months (around 03/26/2020).  Marzetta Board, DPM

## 2020-01-07 ENCOUNTER — Other Ambulatory Visit: Payer: Self-pay

## 2020-01-07 DIAGNOSIS — I10 Essential (primary) hypertension: Secondary | ICD-10-CM

## 2020-01-07 MED ORDER — LISINOPRIL 20 MG PO TABS: 20.0000 mg | ORAL_TABLET | Freq: Every day | ORAL | 0 refills | Status: DC

## 2020-01-07 NOTE — Chronic Care Management (AMB) (Signed)
Chronic Care Management Pharmacy  Name: Stacy Adams  MRN: [id number] DOB: 08/30/1940  Chief Complaint/ HPI  [place],  79 y.o. , female presents for their Initial CCM visit with the clinical pharmacist via telephone due to COVID-19 Pandemic.  PCP : Rochel Brome, MD  Their chronic conditions include: nstemi, CAD, hypertension, afib, angina, GERD, impaired fasting glucose, osteopenia, hyperlipidemia, s/p CABG, anxiety.   Office Visits: 12/17/2019 - Flu shot administered. . Lipid panel drawn - increase Lipitor 80 mg daily.Marland Kitchen  10/10/2019 - improved bp with lisinopril 20 mg daily. Checking bmp for renal function and potassium. Add Vascepa  09/12/2019 - increase lisinopril 20 mg once daily.  Consult Visit: 09/25/2019 - Opthamology visit.  09/09/2019 - Opthamology visit.  09/05/2019 - Podiatry - mechanical debridement.  Medications: Outpatient Encounter Medications as of 01/09/2020  Medication Sig  . atorvastatin (LIPITOR) 40 MG tablet TAKE ONE TABLET BY MOUTH EVERY EVENING  . clopidogrel (PLAVIX) 75 MG tablet TAKE ONE TABLET BY MOUTH ONCE DAILY (Patient taking differently: Take 75 mg by mouth in the morning. )  . lisinopril (ZESTRIL) 20 MG tablet Take 1 tablet (20 mg total) by mouth daily. (Patient taking differently: Take 20 mg by mouth in the morning. )  . metoprolol tartrate (LOPRESSOR) 50 MG tablet TAKE TWO TABLETS BY MOUTH EVERY MORNING AND TAKE ONE TABLET EVERY EVENING.  . nitroGLYCERIN (NITROSTAT) 0.4 MG SL tablet Place 1 tablet (0.4 mg total) under the tongue every 5 (five) minutes as needed for chest pain.  . Omega-3 Fatty Acids (FISH OIL) 1000 MG CPDR Take 1,000 mg by mouth daily.  . [DISCONTINUED] lisinopril (ZESTRIL) 20 MG tablet Take 1 tablet (20 mg total) by mouth daily.   No facility-administered encounter medications on file as of 01/09/2020.     Current Diagnosis/Assessment:  Goals Addressed            This Visit's Progress   . Pharmacy Care Plan        CARE PLAN ENTRY (see longitudinal plan of care for additional care plan information)  Current Barriers:  . Chronic Disease Management support, education, and care coordination needs related to Hypertension, Hyperlipidemia, and Atrial Fibrillation   Hypertension BP Readings from Last 3 Encounters:  12/17/19 136/80  10/10/19 140/80  09/12/19 (!) 168/100   . Pharmacist Clinical Goal(s): o Over the next 90 days, patient will work with PharmD and providers to maintain BP goal <130/80 . Current regimen:  o Lisinopril 20 mg daily  . Interventions: o Reviewed medication list.  o Discussed benefits of activity.  . Patient self care activities - Over the next 90 days, patient will: o Continue taking medication as prescribed.   Hyperlipidemia Lab Results  Component Value Date/Time   LDLCALC 96 12/17/2019 10:11 AM   LDLDIRECT 203.5 01/09/2008 10:48 AM   . Pharmacist Clinical Goal(s): o Over the next 90 days, patient will work with PharmD and providers to achieve LDL goal < 55    . Current regimen:  o Atorvastatin 80 mg daily  o Fish oil 1000 mg daily  o Clopidogrel 75 mg daily  . Interventions: o Reviewed home medications.  o Discussed prescription fish oil and cost. Vascepa is Tier 4 ($90 for 30 days) with [employer] advantage. Generic Lovaza is tier 2 ($30 for 90 days).   . Patient self care activities - Over the next 90 days, patient will: o Work with pharmacist and provider to obtain prescription fish oil.  Afib Pharmacist Clinical Goal(s) o  Over the next 90 days, patient will work with PharmD and providers to manage Afib . Current regimen:  o Metoprolol tartrate 100 mg in the morning and 50 mg in the evening . Interventions: o Discussed medications.  . Patient self care activities - Over the next 90 days, patient will: o Continue to take medication as prescribed.   Medication management . Pharmacist Clinical Goal(s): o Over the next 90 days, patient will work with  PharmD and providers to maintain optimal medication adherence . Current pharmacy: [facility] . Interventions o Comprehensive medication review performed. o Continue current medication management strategy . Patient self care activities - Over the next 90 days, patient will: o Focus on medication adherence by keeping medications together.  o Take medications as prescribed o Report any questions or concerns to PharmD and/or provider(s)  Initial goal documentation       Hyperlipidemia/CAD   LDL goal < 55  Lipid Panel     Component Value Date/Time   CHOL 159 12/17/2019 1011   TRIG 149 12/17/2019 1011   HDL 37 (L) 12/17/2019 1011   LDLCALC 96 12/17/2019 1011   LDLDIRECT 203.5 01/09/2008 1048    Hepatic Function Latest Ref Rng & Units 12/17/2019 09/12/2019 07/08/2019  Total Protein 6.0 - 8.5 g/dL 6.7 7.2 6.8  Albumin 3.7 - 4.7 g/dL 4.6 4.6 4.6  AST 0 - 40 IU/L '18 25 22  ' ALT 0 - 32 IU/L '16 22 25  ' Alk Phosphatase 44 - 121 IU/L 84 116 118(H)  Total Bilirubin 0.0 - 1.2 mg/dL 0.8 0.8 0.9  Bilirubin, Direct 0.0 - 0.3 mg/dL - - -     The ASCVD Risk score Mikey Bussing DC Jr., et al., 2013) failed to calculate for the following reasons:   The 2013 ASCVD risk score is only valid for ages 79 to 60   The patient has a prior MI or stroke diagnosis   Patient has failed these meds in past: pravastatin, rosuvastatin, fenofibrate Patient is currently uncontrolled on the following medications:  . Atorvastatin 80 mg daily  . Clopidogrel 75 mg daily  . Omega Fatty Acids 1000 mg daily  We discussed:  diet and exercise extensively . Lipitor increased at last visit with goal LDL  cholesterol <55. Patient states Vascepa was ordered in the past but was too expensive. Generic Lovaza would be $30 for 90 day supply. If patient tries and fails Lovaza, would potentially  be eligible for appealing tier of Vascepa.  Plan  Recommend considering generic Lovaza.     Impaired Fasting Glucose    Recent Relevant Labs: Lab Results  Component Value Date/Time   HGBA1C 5.6 09/12/2019 09:58 AM   HGBA1C 5.7 (H) 06/06/2019 09:51 AM     Patient is currently controlled on the following medications:   Diet and exercise  We discussed: diet and exercise extensively  Plan  Continue current medications and  Hypertension   BP today is:  <130/80  Office blood pressures are  BP Readings from Last 3 Encounters:  12/17/19 136/80  10/10/19 140/80  09/12/19 (!) 168/100    Patient has failed these meds in the past: none reported Patient is currently controlled on the following medications:   Lisinopril 20 mg daily  Patient checks BP at home: only if needed (painful to check at home)  Patient home BP readings are ranging: not checking unless symptomatic due to pain  We discussed diet and exercise extensively   Diet: Working on healthy diet.   Exercise:  Less since stroke. New kitten at home.   Plan  Continue current medications   AFIB   Patient is currently rate controlled. HR 60 BPM  Vitals with BMI 12/17/2019 10/10/2019 09/12/2019  Height '5\' 0"'  '5\' 0"'  '5\' 0"'   Weight 132 lbs 10 oz 132 lbs 131 lbs  BMI 25.9 37.16 96.78  Systolic 938 101 751  Diastolic 80 80 025  Pulse 60 65 63     Patient has failed these meds in past: amiodarone Patient is currently controlled on the following medications:   Metoprolol 100 mg am and 50 mg pm   {We discussed:  CHA2DS2-VASc Score = 7 [CHF History: 0, HTN History: 1, Diabetes History: 0, Stroke History: 2, Vascular Disease History: 1, Age Score: 2, Gender Score: 1].  Therefore, the patient's annual risk of stroke is 11.2 %.   Patient denies anticoagulation after stroke or with afib.    Plan  Continue current medications.   Vaccines   Reviewed and discussed patient's vaccination history.    Immunization History  Administered Date(s) Administered  . Fluad Quad(high Dose 65+) 12/17/2019  . PFIZER SARS-COV-2 Vaccination  05/27/2019, 06/16/2019  . Td 09/25/1996    Plan  Recommended patient receive third COVID vaccine in office.   Medication Management   Pt uses [facility] pharmacy for all medications Uses pill box? No - keeps medication in same area and takes out of bottles.  Pt endorses 90-99% compliance with lisinopril and atorvastatin per adherence data  We discussed: Current pharmacy is preferred with insurance plan and patient is satisfied with pharmacy services  Plan  Continue current medication management strategy    Follow up: 6 month phone visit

## 2020-01-09 ENCOUNTER — Ambulatory Visit: Payer: PPO

## 2020-01-09 ENCOUNTER — Other Ambulatory Visit: Payer: Self-pay

## 2020-01-09 DIAGNOSIS — E782 Mixed hyperlipidemia: Secondary | ICD-10-CM

## 2020-01-09 DIAGNOSIS — I480 Paroxysmal atrial fibrillation: Secondary | ICD-10-CM

## 2020-01-09 DIAGNOSIS — I10 Essential (primary) hypertension: Secondary | ICD-10-CM

## 2020-01-09 NOTE — Patient Instructions (Addendum)
Visit Information  Thank you for your time discussing your medications. I look forward to working with you to achieve your health care goals. Below is a summary of what we talked about during our visit.   Goals Addressed            This Visit's Progress   . Pharmacy Care Plan       CARE PLAN ENTRY (see longitudinal plan of care for additional care plan information)  Current Barriers:  . Chronic Disease Management support, education, and care coordination needs related to Hypertension, Hyperlipidemia, and Atrial Fibrillation   Hypertension BP Readings from Last 3 Encounters:  12/17/19 136/80  10/10/19 140/80  09/12/19 (!) 168/100   . Pharmacist Clinical Goal(s): o Over the next 90 days, patient will work with PharmD and providers to maintain BP goal <130/80 . Current regimen:  o Lisinopril 20 mg daily  . Interventions: o Reviewed medication list.  o Discussed benefits of activity.  . Patient self care activities - Over the next 90 days, patient will: o Continue taking medication as prescribed.   Hyperlipidemia Lab Results  Component Value Date/Time   LDLCALC 96 12/17/2019 10:11 AM   LDLDIRECT 203.5 01/09/2008 10:48 AM   . Pharmacist Clinical Goal(s): o Over the next 90 days, patient will work with PharmD and providers to achieve LDL goal < 55    . Current regimen:  o Atorvastatin 80 mg daily  o Fish oil 1000 mg daily  o Clopidogrel 75 mg daily  . Interventions: o Reviewed home medications.  o Discussed prescription fish oil and cost. Vascepa is Tier 4 ($90 for 30 days) with Healthte[employer] advantage. Generic Lovaza is tier 2 ($30 for 90 days).   . Patient self care activities - Over the next 90 days, patient will: o Work with pharmacist and provider to obtain prescription fish oil.  Afib Pharmacist Clinical Goal(s) o Over the next 90 days, patient will work with PharmD and providers to manage Afib . Current regimen:  o Metoprolol tartrate 100 mg in the morning and 50  mg in the evening . Interventions: o Discussed medications.  . Patient self care activities - Over the next 90 days, patient will: o Continue to take medication as prescribed.   Medication management . Pharmacist Clinical Goal(s): o Over the next 90 days, patient will work with PharmD and providers to maintain optimal medication adherence . Current pharmacy: [facility] . Interventions o Comprehensive medication review performed. o Continue current medication management strategy . Patient self care activities - Over the next 90 days, patient will: o Focus on medication adherence by keeping medications together.  o Take medications as prescribed o Report any questions or concerns to PharmD and/or provider(s)  Initial goal documentation        Stacy Adams was given information about Chronic Care Management services today including:  1. CCM service includes personalized support from designated clinical staff supervised by her physician, including individualized plan of care and coordination with other care providers 2. 24/7 contact phone numbers for assistance for urgent and routine care needs. 3. Standard insurance, coinsurance, copays and deductibles apply for chronic care management only during months in which we provide at least 20 minutes of these services. Most insurances cover these services at 100%, however patients may be responsible for any copay, coinsurance and/or deductible if applicable. This service may help you avoid the need for more expensive face-to-face services. 4. Only one practitioner may furnish and bill the service in  a calendar month. 5. The patient may stop CCM services at any time (effective at the end of the month) by phone call to the office staff.  Patient agreed to services and verbal consent obtained.   The patient verbalized understanding of instructions provided today and agreed to receive a mailed copy of patient instruction and/or  educational materials. Telephone follow up appointment with pharmacy team member scheduled for:06/2020  [name], PharmD Clinical Pharmacist [place] [phone] (office) [phone] (mobile)  DASH Eating Plan DASH stands for "Dietary Approaches to Stop Hypertension." The DASH eating plan is a healthy eating plan that has been shown to reduce high blood pressure (hypertension). It may also reduce your risk for type 2 diabetes, heart disease, and stroke. The DASH eating plan may also help with weight loss. What are tips for following this plan?  General guidelines  Avoid eating more than 2,300 mg (milligrams) of salt (sodium) a day. If you have hypertension, you may need to reduce your sodium intake to 1,500 mg a day.  Limit alcohol intake to no more than 1 drink a day for nonpregnant women and 2 drinks a day for men. One drink equals 12 oz of beer, 5 oz of wine, or 1 oz of hard liquor.  Work with your health care provider to maintain a healthy body weight or to lose weight. Ask what an ideal weight is for you.  Get at least 30 minutes of exercise that causes your heart to beat faster (aerobic exercise) most days of the week. Activities may include walking, swimming, or biking.  Work with your health care provider or diet and nutrition specialist (dietitian) to adjust your eating plan to your individual calorie needs. Reading food labels   Check food labels for the amount of sodium per serving. Choose foods with less than 5 percent of the Daily Value of sodium. Generally, foods with less than 300 mg of sodium per serving fit into this eating plan.  To find whole grains, look for the word "whole" as the first word in the ingredient list. Shopping  Buy products labeled as "low-sodium" or "no salt added."  Buy fresh foods. Avoid canned foods and premade or frozen meals. Cooking  Avoid adding salt when cooking. Use salt-free seasonings or herbs instead of table  salt or sea salt. Check with your health care provider or pharmacist before using salt substitutes.  Do not fry foods. Cook foods using healthy methods such as baking, boiling, grilling, and broiling instead.  Cook with heart-healthy oils, such as olive, canola, soybean, or sunflower oil. Meal planning  Eat a balanced diet that includes: ? 5 or more servings of fruits and vegetables each day. At each meal, try to fill half of your plate with fruits and vegetables. ? Up to 6-8 servings of whole grains each day. ? Less than 6 oz of lean meat, poultry, or fish each day. A 3-oz serving of meat is about the same size as a deck of cards. One egg equals 1 oz. ? 2 servings of low-fat dairy each day. ? A serving of nuts, seeds, or beans 5 times each week. ? Heart-healthy fats. Healthy fats called Omega-3 fatty acids are found in foods such as flaxseeds and coldwater fish, like sardines, salmon, and mackerel.  Limit how much you eat of the following: ? Canned or prepackaged foods. ? Food that is high in trans fat, such as fried foods. ? Food that is high in saturated fat, such as  fatty meat. ? Sweets, desserts, sugary drinks, and other foods with added sugar. ? Full-fat dairy products.  Do not salt foods before eating.  Try to eat at least 2 vegetarian meals each week.  Eat more home-cooked food and less restaurant, buffet, and fast food.  When eating at a restaurant, ask that your food be prepared with less salt or no salt, if possible. What foods are recommended? The items listed may not be a complete list. Talk with your dietitian about what dietary choices are best for you. Grains Whole-grain or whole-wheat bread. Whole-grain or whole-wheat pasta. Stacy Adams rice. Stacy Adams. Bulgur. Whole-grain and low-sodium cereals. Pita bread. Low-fat, low-sodium crackers. Whole-wheat flour tortillas. Vegetables Fresh or frozen vegetables (raw, steamed, roasted, or grilled). Low-sodium or  reduced-sodium tomato and vegetable juice. Low-sodium or reduced-sodium tomato sauce and tomato paste. Low-sodium or reduced-sodium canned vegetables. Fruits All fresh, dried, or frozen fruit. Canned fruit in natural juice (without added sugar). Meat and other protein foods Skinless chicken or [place]. Ground chicken or [place]. Pork with fat trimmed off. Fish and seafood. Egg whites. Dried beans, peas, or lentils. Unsalted nuts, nut butters, and seeds. Unsalted canned beans. Lean cuts of beef with fat trimmed off. Low-sodium, lean deli meat. Dairy Low-fat (1%) or fat-free (skim) milk. Fat-free, low-fat, or reduced-fat cheeses. Nonfat, low-sodium ricotta or cottage cheese. Low-fat or nonfat yogurt. Low-fat, low-sodium cheese. Fats and oils Soft margarine without trans fats. Vegetable oil. Low-fat, reduced-fat, or light mayonnaise and salad dressings (reduced-sodium). Canola, safflower, olive, soybean, and sunflower oils. Avocado. Seasoning and other foods Herbs. Spices. Seasoning mixes without salt. Unsalted popcorn and pretzels. Fat-free sweets. What foods are not recommended? The items listed may not be a complete list. Talk with your dietitian about what dietary choices are best for you. Grains Baked goods made with fat, such as croissants, muffins, or some breads. Dry pasta or rice meal packs. Vegetables Creamed or fried vegetables. Vegetables in a cheese sauce. Regular canned vegetables (not low-sodium or reduced-sodium). Regular canned tomato sauce and paste (not low-sodium or reduced-sodium). Regular tomato and vegetable juice (not low-sodium or reduced-sodium). Stacy Adams. Olives. Fruits Canned fruit in a light or heavy syrup. Fried fruit. Fruit in cream or butter sauce. Meat and other protein foods Fatty cuts of meat. Ribs. Fried meat. Stacy Adams. Sausage. Bologna and other processed lunch meats. Salami. Fatback. Hotdogs. Bratwurst. Salted nuts and seeds. Canned beans with added salt. Canned or  smoked fish. Whole eggs or egg yolks. Chicken or [place] with skin. Dairy Whole or 2% milk, cream, and half-and-half. Whole or full-fat cream cheese. Whole-fat or sweetened yogurt. Full-fat cheese. Nondairy creamers. Whipped toppings. Processed cheese and cheese spreads. Fats and oils Butter. Stick margarine. Lard. Shortening. Ghee. Bacon fat. Tropical oils, such as coconut, palm kernel, or palm oil. Seasoning and other foods Salted popcorn and pretzels. Onion salt, garlic salt, seasoned salt, table salt, and sea salt. Worcestershire sauce. Tartar sauce. Barbecue sauce. Teriyaki sauce. Soy sauce, including reduced-sodium. Steak sauce. Canned and packaged gravies. Fish sauce. Oyster sauce. Cocktail sauce. Horseradish that you find on the shelf. Ketchup. Mustard. Meat flavorings and tenderizers. Bouillon cubes. Hot sauce and Tabasco sauce. Premade or packaged marinades. Premade or packaged taco seasonings. Relishes. Regular salad dressings. Where to find more information:  National Heart, Lung, and [place]: https://wilson-eaton.com/  American Heart Association: www.heart.org Summary  The DASH eating plan is a healthy eating plan that has been shown to reduce high blood pressure (hypertension). It may also reduce your risk for  type 2 diabetes, heart disease, and stroke.  With the DASH eating plan, you should limit salt (sodium) intake to 2,300 mg a day. If you have hypertension, you may need to reduce your sodium intake to 1,500 mg a day.  When on the DASH eating plan, aim to eat more fresh fruits and vegetables, whole grains, lean proteins, low-fat dairy, and heart-healthy fats.  Work with your health care provider or diet and nutrition specialist (dietitian) to adjust your eating plan to your individual calorie needs. This information is not intended to replace advice given to you by your health care provider. Make sure you discuss any questions you have with your health care provider. Document  Revised: 02/24/2017 Document Reviewed: 03/07/2016 Elsevier Patient Education  2020 [employer].

## 2020-01-13 ENCOUNTER — Other Ambulatory Visit: Payer: Self-pay | Admitting: Family Medicine

## 2020-01-13 MED ORDER — OMEGA-3-ACID ETHYL ESTERS 1 G PO CAPS: 2.0000 g | ORAL_CAPSULE | Freq: Two times a day (BID) | ORAL | 3 refills | Status: DC

## 2020-02-03 ENCOUNTER — Telehealth: Payer: Self-pay

## 2020-02-03 ENCOUNTER — Other Ambulatory Visit: Payer: Self-pay

## 2020-02-03 MED ORDER — OMEGA-3-ACID ETHYL ESTERS 1 G PO CAPS
2.0000 g | ORAL_CAPSULE | Freq: Two times a day (BID) | ORAL | 1 refills | Status: DC
Start: 2020-02-03 — End: 2020-03-30

## 2020-02-03 NOTE — Chronic Care Management (AMB) (Signed)
    Chronic Care Management Pharmacy Assistant   Name: Stacy Adams  MRN: [id number] DOB: [birth year]  Reason for Encounter: Disease State- General Adherence Call -   Patient Questions:   1.  Have you seen any other providers since your last visit? No   2.  Any changes in your medicines or health? Yes Lovaza Two tablets two times a day.     PCP : Rochel Brome, MD  Allergies:   Allergies  Allergen Reactions  . Ciprofloxacin     Pruritis and related conditions, NOS  . Macrobid [Nitrofurantoin]     Abdominal pain  . Sulfa Antibiotics Other (See Comments)    Swelling years ago     Medications: Outpatient Encounter Medications as of 02/03/2020  Medication Sig  . atorvastatin (LIPITOR) 40 MG tablet TAKE ONE TABLET BY MOUTH EVERY EVENING (Patient taking differently: Take 80 mg by mouth every evening. Increased after last lipid panel. Will increase dose to 80 mg at next refill.)  . clopidogrel (PLAVIX) 75 MG tablet TAKE ONE TABLET BY MOUTH ONCE DAILY (Patient taking differently: Take 75 mg by mouth in the morning. )  . lisinopril (ZESTRIL) 20 MG tablet Take 1 tablet (20 mg total) by mouth daily. (Patient taking differently: Take 20 mg by mouth in the morning. )  . metoprolol tartrate (LOPRESSOR) 50 MG tablet TAKE TWO TABLETS BY MOUTH EVERY MORNING AND TAKE ONE TABLET EVERY EVENING.  . nitroGLYCERIN (NITROSTAT) 0.4 MG SL tablet Place 1 tablet (0.4 mg total) under the tongue every 5 (five) minutes as needed for chest pain.  Marland Kitchen omega-3 acid ethyl esters (LOVAZA) 1 g capsule Take 2 capsules (2 g total) by mouth 2 (two) times daily.   No facility-administered encounter medications on file as of 02/03/2020.    Current Diagnosis: Patient Active Problem List   Diagnosis Date Noted  . Acute cystitis with hematuria 07/08/2019  . Hyperkalemia 07/08/2019  . Angina pectoris ([place]) 07/08/2019  . Aphasia 06/13/2019  . 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 Impaired fasting glucose 05/30/2019   . Varicose veins of leg with pain, right 12/29/2015  . PAF (paroxysmal atrial fibrillation) post-CABG 04/02/2011  . S/P CABG x 4: CORONARY ARTERY BYPASS GRAFTING (CABG) X 4 (LIMA TO LAD,SVG to Diagonal,SVG to distal Circumflex, SVG to PDA 03/30/2011  . NSTEMI (non-ST elevated myocardial infarction) Urgent cath 03/25/2011    Class: Hospitalized for  . CAD, multiple vessel 03/25/2011  . ANXIETY 08/24/2007  . Hyperlipidemia with target LDL less than 70 12/21/2006  . Essential hypertension, benign 12/21/2006  . GERD 12/21/2006  . OSTEOPENIA 12/21/2006      Follow-Up:  Pharmacist Review-  Called patient to check on how she is tolerating her Lovaza.  Patient and husband on phone - states that she is taking 4 tablets a day without any problems . They would like to have a 90 day script states it would be cheaper for them, since she it tolerating okay.   Elly Modena Owens Shark, [place] Notified  Judithann Sheen, [facility] Clinical Pharmacist Assistant [phone]

## 2020-02-26 ENCOUNTER — Ambulatory Visit (INDEPENDENT_AMBULATORY_CARE_PROVIDER_SITE_OTHER): Payer: PPO | Admitting: Family Medicine

## 2020-02-26 ENCOUNTER — Ambulatory Visit: Payer: PPO | Admitting: Family Medicine

## 2020-02-26 ENCOUNTER — Other Ambulatory Visit: Payer: Self-pay

## 2020-02-26 ENCOUNTER — Encounter: Payer: Self-pay | Admitting: Family Medicine

## 2020-02-26 VITALS — BP 172/98 | HR 64 | Temp 97.5°F | Resp 18 | Ht 60.0 in | Wt 133.4 lb

## 2020-02-26 DIAGNOSIS — Z0000 Encounter for general adult medical examination without abnormal findings: Secondary | ICD-10-CM | POA: Diagnosis not present

## 2020-02-26 NOTE — Patient Instructions (Addendum)
  Ms. Bricco , Thank you for taking time to come for your Medicare Wellness Visit. I appreciate your ongoing commitment to your health goals. Please review the following plan we discussed and let me know if I can assist you in the future.   These are the goals we discussed: elevated blood pressure-concern Consider signing a living will Goals    . Pharmacy Care Plan     CARE PLAN ENTRY (see longitudinal plan of care for additional care plan information)  Current Barriers:  . Chronic Disease Management support, education, and care coordination needs related to Hypertension, Hyperlipidemia, and Atrial Fibrillation   Hypertension BP Readings from Last 3 Encounters:  12/17/19 136/80  10/10/19 140/80  09/12/19 (!) 168/100   . Pharmacist Clinical Goal(s): o Over the next 90 days, patient will work with PharmD and providers to maintain BP goal <130/80 . Current regimen:  o Lisinopril 20 mg daily  . Interventions: o Reviewed medication list.  o Discussed benefits of activity.  . Patient self care activities - Over the next 90 days, patient will: o Continue taking medication as prescribed.   Hyperlipidemia Lab Results  Component Value Date/Time   LDLCALC 96 12/17/2019 10:11 AM   LDLDIRECT 203.5 01/09/2008 10:48 AM   . Pharmacist Clinical Goal(s): o Over the next 90 days, patient will work with PharmD and providers to achieve LDL goal < 55    . Current regimen:  o Atorvastatin 80 mg daily  o Fish oil 1000 mg daily  o Clopidogrel 75 mg daily  . Interventions: o Reviewed home medications.  o Discussed prescription fish oil and cost. Vascepa is Tier 4 ($90 for 30 days) with Healthte[employer] advantage. Generic Lovaza is tier 2 ($30 for 90 days).   . Patient self care activities - Over the next 90 days, patient will: o Work with pharmacist and provider to obtain prescription fish oil.  Afib Pharmacist Clinical Goal(s) o Over the next 90 days, patient will work with PharmD and providers to  manage Afib . Current regimen:  o Metoprolol tartrate 100 mg in the morning and 50 mg in the evening . Interventions: o Discussed medications.  . Patient self care activities - Over the next 90 days, patient will: o Continue to take medication as prescribed.   Medication management . Pharmacist Clinical Goal(s): o Over the next 90 days, patient will work with PharmD and providers to maintain optimal medication adherence . Current pharmacy: [facility] . Interventions o Comprehensive medication review performed. o Continue current medication management strategy . Patient self care activities - Over the next 90 days, patient will: o Focus on medication adherence by keeping medications together.  o Take medications as prescribed o Report any questions or concerns to PharmD and/or provider(s)  Initial goal documentation        This is a list of the screening recommended for you and due dates:  Health Maintenance  Topic Date Due  . DEXA scan (bone density measurement)  Never done  . Pneumonia vaccines (1 of 2 - PCV13) Never done  . Tetanus Vaccine  09/26/2006  . Flu Shot  Completed  . COVID-19 Vaccine  Completed

## 2020-02-26 NOTE — Progress Notes (Signed)
Subjective:    Stacy Adams is a 79 y.o. female who presents for Medicare Annual/Subsequent preventive examination.  Preventive Screening-Counseling & Management  Tobacco Social History   Tobacco Use  Smoking Status Never Smoker  Smokeless Tobacco Never Used     Problems Prior to Visit 1. [facility] with aphasia  Current Problems (verified) Patient Active Problem List   Diagnosis Date Noted  . Acute cystitis with hematuria 07/08/2019  . Hyperkalemia 07/08/2019  . Angina pectoris ([place]) 07/08/2019  . Aphasia 06/13/2019  . 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 Impaired fasting glucose 05/30/2019  . Varicose veins of leg with pain, right 12/29/2015  . PAF (paroxysmal atrial fibrillation) post-CABG 04/02/2011  . S/P CABG x 4: CORONARY ARTERY BYPASS GRAFTING (CABG) X 4 (LIMA TO LAD,SVG to Diagonal,SVG to distal Circumflex, SVG to PDA 03/30/2011  . NSTEMI (non-ST elevated myocardial infarction) Urgent cath 03/25/2011    Class: Hospitalized for  . CAD, multiple vessel 03/25/2011  . ANXIETY 08/24/2007  . Hyperlipidemia with target LDL less than 70 12/21/2006  . Essential hypertension, benign 12/21/2006  . GERD 12/21/2006  . OSTEOPENIA 12/21/2006    Medications Prior to Visit Current Outpatient Medications on File Prior to Visit  Medication Sig Dispense Refill  . atorvastatin (LIPITOR) 40 MG tablet TAKE ONE TABLET BY MOUTH EVERY EVENING (Patient taking differently: Take 80 mg by mouth every evening. Increased after last lipid panel. Will increase dose to 80 mg at next refill.) 90 tablet 1  . clopidogrel (PLAVIX) 75 MG tablet TAKE ONE TABLET BY MOUTH ONCE DAILY (Patient taking differently: Take 75 mg by mouth in the morning. ) 90 tablet 1  . lisinopril (ZESTRIL) 20 MG tablet Take 1 tablet (20 mg total) by mouth daily. (Patient taking differently: Take 20 mg by mouth in the morning. ) 90 tablet 0  . metoprolol tartrate (LOPRESSOR) 50 MG tablet TAKE TWO TABLETS BY MOUTH EVERY MORNING AND  TAKE ONE TABLET EVERY EVENING. 270 tablet 0  . nitroGLYCERIN (NITROSTAT) 0.4 MG SL tablet Place 1 tablet (0.4 mg total) under the tongue every 5 (five) minutes as needed for chest pain. 50 tablet 3  . omega-3 acid ethyl esters (LOVAZA) 1 g capsule Take 2 capsules (2 g total) by mouth 2 (two) times daily. 120 capsule 1   No current facility-administered medications on file prior to visit.    Current Medications (verified) Current Outpatient Medications  Medication Sig Dispense Refill  . atorvastatin (LIPITOR) 40 MG tablet TAKE ONE TABLET BY MOUTH EVERY EVENING (Patient taking differently: Take 80 mg by mouth every evening. Increased after last lipid panel. Will increase dose to 80 mg at next refill.) 90 tablet 1  . clopidogrel (PLAVIX) 75 MG tablet TAKE ONE TABLET BY MOUTH ONCE DAILY (Patient taking differently: Take 75 mg by mouth in the morning. ) 90 tablet 1  . lisinopril (ZESTRIL) 20 MG tablet Take 1 tablet (20 mg total) by mouth daily. (Patient taking differently: Take 20 mg by mouth in the morning. ) 90 tablet 0  . metoprolol tartrate (LOPRESSOR) 50 MG tablet TAKE TWO TABLETS BY MOUTH EVERY MORNING AND TAKE ONE TABLET EVERY EVENING. 270 tablet 0  . nitroGLYCERIN (NITROSTAT) 0.4 MG SL tablet Place 1 tablet (0.4 mg total) under the tongue every 5 (five) minutes as needed for chest pain. 50 tablet 3  . omega-3 acid ethyl esters (LOVAZA) 1 g capsule Take 2 capsules (2 g total) by mouth 2 (two) times daily. 120 capsule 1   No current  facility-administered medications for this visit.     Allergies (verified) Ciprofloxacin, Macrobid [nitrofurantoin], and Sulfa antibiotics   PAST HISTORY  Family History Family History  Problem Relation Age of Onset  . Alzheimer's disease Mother   . Alzheimer's disease Father   . Cancer Brother   . Lupus Brother     Social History Social History   Tobacco Use  . Smoking status: Never Smoker  . Smokeless tobacco: Never Used  Substance Use Topics   . Alcohol use: No     Are there smokers in your home (other than you)? No smokers  Risk Factors Current exercise habits:walking Dietary issues discussed: 3 meals  Cardiac risk factors: 2012-MI-Quad bypass, CVA-affected speech-no choking   Depression Screen PHQ9-0 Activities of Daily Living In your present state of health, do you have any difficulty performing the following activities?:  Driving? No longer driving-has license Managing money?  Husband manages money Feeding yourself? No difficulty Getting from bed to chair? No difficulty Climbing a flight of stairs? Yes-pt rarely goes to basement. 5 steps upstairs-hand rail  Preparing food and eating?: husband prepares food Bathing or showering? Bathing, no showering-beautician weekly to wash hair, washes at the sink Getting dressed: no difficulty Getting to the toilet? No difficulty Using the toilet:no difficulty Moving around from place to place: no difficulty In the past year have you fallen or had a near fall?:no falls    Do you have more than one partner? Married 51years   Hearing Difficulties: hearing improved  Do you feel that you have a problem with memory? Yes--short term   Do you feel safe at home?yes-no med alert-husband in home  Cognitive Testing  CIT Score 6, MMS 18  Advanced Directives have been discussed with the patient? Copy of paperwork given to pt and husband  List the Names of Other Physician/Practitioners you currently use: 1.  [facility] 2. Podiatrist-[facility]   Immunization History  Administered Date(s) Administered  . Fluad Quad(high Dose 65+) 12/17/2019  . PFIZER SARS-COV-2 Vaccination 05/27/2019, 06/16/2019, 01/30/2020  . Td 09/25/1996    Screening Tests Health Maintenance  Topic Date Due  . DEXA SCAN  Never done  . PNA vac Low Risk Adult (1 of 2 - PCV13) Never done  . TETANUS/TDAP  09/26/2006  . INFLUENZA VACCINE  Completed  . COVID-19 Vaccine  Completed    All  answers were reviewed with the patient and necessary referrals were made:  Mertha Baars, MD   02/26/2020    Objective:    Vision by Snellen chart: right eye:Body mass index is 26.05 kg/m. BP (!) 172/98   Pulse 64   Temp (!) 97.5 F (36.4 C)   Resp 18   Ht 5' (1.524 m)   Wt 133 lb 6.4 oz (60.5 kg)   SpO2 97%   BMI 26.05 kg/m       Assessment:     1. Medicare annual wellness visit, subsequent Pt does not want DEXA/Mammogram.  Will check on shingles vaccine and Td at pharmacy.  Plan:    Patient Instructions (the written plan) was given to the patient.  Medicare Attestation I have personally reviewed: The patient's medical and social history Their use of alcohol, tobacco or illicit drugs Their current medications and supplements The patient's functional ability including ADLs,fall risks, home safety risks, cognitive, and hearing and visual impairment Diet and physical activities Evidence for depression or mood disorders  The patient's weight, height, BMI, and visual acuity have been  recorded in the chart.  I have made referrals, counseling, and provided education to the patient based on review of the above and I have provided the patient with a written personalized care plan for preventive services.     Mertha Baars, MD   02/26/2020

## 2020-03-12 ENCOUNTER — Other Ambulatory Visit: Payer: Self-pay | Admitting: Family Medicine

## 2020-03-23 ENCOUNTER — Other Ambulatory Visit: Payer: Self-pay | Admitting: Physician Assistant

## 2020-03-23 ENCOUNTER — Other Ambulatory Visit: Payer: Self-pay | Admitting: Family Medicine

## 2020-03-23 MED ORDER — ATORVASTATIN CALCIUM 80 MG PO TABS: 80.0000 mg | ORAL_TABLET | Freq: Every day | ORAL | 0 refills | Status: DC

## 2020-03-26 ENCOUNTER — Encounter: Payer: Self-pay | Admitting: Podiatry

## 2020-03-26 ENCOUNTER — Other Ambulatory Visit: Payer: Self-pay

## 2020-03-26 ENCOUNTER — Ambulatory Visit: Payer: PPO | Admitting: Podiatry

## 2020-03-26 DIAGNOSIS — M79674 Pain in right toe(s): Secondary | ICD-10-CM | POA: Diagnosis not present

## 2020-03-26 DIAGNOSIS — B351 Tinea unguium: Secondary | ICD-10-CM

## 2020-03-26 DIAGNOSIS — I739 Peripheral vascular disease, unspecified: Secondary | ICD-10-CM

## 2020-03-26 DIAGNOSIS — M79675 Pain in left toe(s): Secondary | ICD-10-CM | POA: Diagnosis not present

## 2020-03-27 NOTE — Progress Notes (Signed)
Subjective:  Patient ID: Stacy Adams, female    DOB: 1940/12/14,  MRN: [id number]  79 y.o. female presents with for at risk foot care. Patient has h/o PAD and painful thick toenails that are difficult to trim. Pain interferes with ambulation. Aggravating factors include wearing enclosed shoe gear. Pain is relieved with periodic professional debridement.   Husband is present during today's visit.  They voiced no new pedal problems on today's visit.  Review of Systems: Negative except as noted in the HPI.  Past Medical History:  Diagnosis Date  . Anxiety   . Aphasia following cerebral infarction   . Blindness left eye category 4, normal vision right eye   . CAD (coronary artery disease), native coronary artery 03/25/2011   80-90% LAD after D1 followed by 60-70% consecutive lesions at T2. Circumflex: 95% ostial, RCA 95-99% --> referred for CABG  . CAD, multiple vessel 08/24/2007  . CHF (congestive heart failure) (HCC)   . GERD (gastroesophageal reflux disease)   . Hyperlipidemia 12/21/2006  . HYPERTENSION 12/21/2006  . Hypokalemia   . NSTEMI (non-ST elevated myocardial infarction) Urgent cath 03/25/2011   Multivessel CAD  . Osteopenia 12/21/2006  . PAF (paroxysmal atrial fibrillation) [facility]) January 2013   Postop A. fib, no recurrence  . S/P CABG x 4 03/26/2011   LIMA-LAD, SVG-Diag, SVG-Cx-OM, SVG-RCA; Intra-Op TEE: EF 60-65%.  . Stroke [facility])    October 2020  . TIA (transient ischemic attack)    Past Surgical History:  Procedure Laterality Date  . ABDOMINAL HYSTERECTOMY    . CARDIAC CATHETERIZATION  03/25/2011   EF 60 is 55%.80-90% LAD after D1 followed by 60-70% consecutive lesions at D2. Circumflex: 95% ostial, RCA 95-99% --> referred for CABG  . CORONARY ARTERY BYPASS GRAFT  03/26/2011   Procedure: CORONARY ARTERY BYPASS GRAFTING (CABG);  Surgeon: Delight Ovens, MD;  Location: [facility] OR;  Service: Open Heart Surgery;  Laterality: N/A;  . LEFT HEART CATHETERIZATION WITH  CORONARY ANGIOGRAM N/A 03/25/2011   Procedure: LEFT HEART CATHETERIZATION WITH CORONARY ANGIOGRAM;  Surgeon: Marykay Lex, MD;  Location[facility] C[facility] LAB;  Service: Cardiovascular;  Laterality: N/A;   Patient Active Problem List   Diagnosis Date Noted  . Medicare annual wellness visit, subsequent 02/26/2020  . Acute cystitis with hematuria 07/08/2019  . Hyperkalemia 07/08/2019  . Angina pectoris (HCC) 07/08/2019  . Aphasia 06/13/2019  . 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 Impaired fasting glucose 05/30/2019  . Varicose veins of leg with pain, right 12/29/2015  . PAF (paroxysmal atrial fibrillation) post-CABG 04/02/2011  . S/P CABG x 4: CORONARY ARTERY BYPASS GRAFTING (CABG) X 4 (LIMA TO LAD,SVG to Diagonal,SVG to distal Circumflex, SVG to PDA 03/30/2011  . NSTEMI (non-ST elevated myocardial infarction) Urgent cath 03/25/2011    Class: Hospitalized for  . CAD, multiple vessel 03/25/2011  . ANXIETY 08/24/2007  . Hyperlipidemia with target LDL less than 70 12/21/2006  . Essential hypertension, benign 12/21/2006  . GERD 12/21/2006  . OSTEOPENIA 12/21/2006    Current Outpatient Medications:  .  atorvastatin (LIPITOR) 80 MG tablet, Take 1 tablet (80 mg total) by mouth daily., Disp: 90 tablet, Rfl: 0 .  clopidogrel (PLAVIX) 75 MG tablet, Take 1 tablet (75 mg total) by mouth in the morning., Disp: 90 tablet, Rfl: 1 .  lisinopril (ZESTRIL) 20 MG tablet, Take 1 tablet (20 mg total) by mouth daily. (Patient taking differently: Take 20 mg by mouth in the morning. ), Disp: 90 tablet, Rfl: 0 .  metoprolol tartrate (LOPRESSOR) 50  MG tablet, TAKE 2 TABLETS BY MOUTH EVERY MORNING AND 1 TABLET EVERY EVENING., Disp: 270 tablet, Rfl: 0 .  nitroGLYCERIN (NITROSTAT) 0.4 MG SL tablet, Place 1 tablet (0.4 mg total) under the tongue every 5 (five) minutes as needed for chest pain., Disp: 50 tablet, Rfl: 3 .  omega-3 acid ethyl esters (LOVAZA) 1 g capsule, Take 2 capsules (2 g total) by mouth 2 (two) times  daily., Disp: 120 capsule, Rfl: 1 Allergies  Allergen Reactions  . Ciprofloxacin     Pruritis and related conditions, NOS  . Macrobid [Nitrofurantoin]     Abdominal pain  . Sulfa Antibiotics Other (See Comments)    Swelling years ago    Social History   Occupational History  . Not on file  Tobacco Use  . Smoking status: Never Smoker  . Smokeless tobacco: Never Used  Vaping Use  . Vaping Use: Never used  Substance and Sexual Activity  . Alcohol use: No  . Drug use: No  . Sexual activity: Yes    Objective:   Constitutional Pt is a pleasant 79 y.o. Caucasian female in NAD.Marland Kitchen AAO x 3.   Vascular Capillary fill time to digits <3 seconds b/l lower extremities. Faintly palpable DP pulse(s) b/l lower extremities. Nonpalpable PT pulse(s) b/l lower extremities. Pedal hair sparse. Lower extremity skin temperature gradient within normal limits. Varicosities present b/l. No cyanosis or clubbing noted.  Neurologic Normal speech. Oriented to person, place, and time. Protective sensation intact 5/5 intact bilaterally with 10g monofilament b/l.  Dermatologic Pedal skin with normal turgor, texture and tone bilaterally. No open wounds bilaterally. No interdigital macerations bilaterally. Toenails 1-5 b/l elongated, discolored, dystrophic, thickened, crumbly with subungual debris and tenderness to dorsal palpation. Incurvated nailplate medial border(s) R 2nd toe.  Nail border hypertrophy minimal. There is tenderness to palpation. Sign(s) of infection: no clinical signs of infection noted on examination today..  Orthopedic: Normal muscle strength 5/5 to all lower extremity muscle groups bilaterally. No pain crepitus or joint limitation noted with ROM b/l. Hammertoes noted to the b/l lower extremities.   Radiographs: None Assessment:   1. Pain due to onychomycosis of toenails of both feet   2. PVD (peripheral vascular disease) ([place])    Plan:  Patient was evaluated and treated and all questions  answered.  Onychomycosis with pain -Nails palliatively debridement as below. -Educated on self-care  Procedure: Nail Debridement Rationale: Pain Type of Debridement: manual, sharp debridement. Instrumentation: Nail nipper, rotary burr. Number of Nails: 10  -Examined patient. -Toenails 1-5 b/l were debrided in length and girth with sterile nail nippers and dremel without iatrogenic bleeding.  -Offending nail border debrided and curretaged R 2nd toe utilizing sterile nail nipper and currette. Border cleansed with alcohol and triple antibiotic applied. No further treatment required by patient/caregiver. -Patient to report any pedal injuries to medical professional immediately. -Patient/POA to call should there be question/concern in the interim.  Return in about 3 months (around 06/24/2020).  Marzetta Board, DPM

## 2020-03-30 ENCOUNTER — Other Ambulatory Visit: Payer: Self-pay

## 2020-03-30 ENCOUNTER — Other Ambulatory Visit: Payer: Self-pay | Admitting: Physician Assistant

## 2020-03-30 DIAGNOSIS — I10 Essential (primary) hypertension: Secondary | ICD-10-CM

## 2020-03-30 MED ORDER — OMEGA-3-ACID ETHYL ESTERS 1 G PO CAPS: 2.0000 g | ORAL_CAPSULE | Freq: Two times a day (BID) | ORAL | 1 refills | Status: AC

## 2020-05-14 ENCOUNTER — Telehealth: Payer: Self-pay

## 2020-05-14 NOTE — Progress Notes (Signed)
    Chronic Care Management Pharmacy Assistant   Name: Stacy Adams  MRN: [id number] DOB: 25-May-1940  Reason for Encounter: Disease State for general adherence  Patient Questions:  1.  Have you seen any other providers since your last visit?  03/26/20-Podiatry 02/26/20-PCP- wellness exam   2.  Any changes in your medicines or health? No    PCP : Rochel Brome, MD  Allergies:   Allergies  Allergen Reactions  . Ciprofloxacin     Pruritis and related conditions, NOS  . Macrobid [Nitrofurantoin]     Abdominal pain  . Sulfa Antibiotics Other (See Comments)    Swelling years ago     Medications: Outpatient Encounter Medications as of 05/14/2020  Medication Sig  . atorvastatin (LIPITOR) 80 MG tablet Take 1 tablet (80 mg total) by mouth daily.  . clopidogrel (PLAVIX) 75 MG tablet Take 1 tablet (75 mg total) by mouth in the morning.  Marland Kitchen lisinopril (ZESTRIL) 20 MG tablet TAKE ONE TABLET BY MOUTH EVERY DAY  . metoprolol tartrate (LOPRESSOR) 50 MG tablet TAKE 2 TABLETS BY MOUTH EVERY MORNING AND 1 TABLET EVERY EVENING.  . nitroGLYCERIN (NITROSTAT) 0.4 MG SL tablet Place 1 tablet (0.4 mg total) under the tongue every 5 (five) minutes as needed for chest pain.  Marland Kitchen omega-3 acid ethyl esters (LOVAZA) 1 g capsule Take 2 capsules (2 g total) by mouth 2 (two) times daily.   No facility-administered encounter medications on file as of 05/14/2020.    Current Diagnosis: Patient Active Problem List   Diagnosis Date Noted  . Medicare annual wellness visit, subsequent 02/26/2020  . Acute cystitis with hematuria 07/08/2019  . Hyperkalemia 07/08/2019  . Angina pectoris ([place]) 07/08/2019  . Aphasia 06/13/2019  . 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 Impaired fasting glucose 05/30/2019  . Varicose veins of leg with pain, right 12/29/2015  . PAF (paroxysmal atrial fibrillation) post-CABG 04/02/2011  . S/P CABG x 4: CORONARY ARTERY BYPASS GRAFTING (CABG) X 4 (LIMA TO LAD,SVG to Diagonal,SVG to  distal Circumflex, SVG to PDA 03/30/2011  . NSTEMI (non-ST elevated myocardial infarction) Urgent cath 03/25/2011    Class: Hospitalized for  . CAD, multiple vessel 03/25/2011  . ANXIETY 08/24/2007  . Hyperlipidemia with target LDL less than 70 12/21/2006  . Essential hypertension, benign 12/21/2006  . GERD 12/21/2006  . OSTEOPENIA 12/21/2006   Patient stated she is doing well.  Patient stated she is not checking her blood pressures at home.  She stated her appetite is good, she watches her salt and sugar intake.  Patient stated she is as active as she can be at her age.  She is not having any issue with getting her medication, she is taking them as directed.    Adherence review ? Medication Adherence for Cholesterol (Statins) (MAC)  Met: Med Compliance CHOL:  90-99% ? Medication Adherence for Diabetes Medications (MAD) 0 ? Medication Adherence for Hypertension (RAS antogoniststs) [facility])  Met: Med Compliance HTN:  90-99%  Follow-Up:  Pharmacist Review  Donette Larry, CPP notified  Clarita Leber, [place] Pharmacist Assistant [phone]

## 2020-06-11 ENCOUNTER — Other Ambulatory Visit: Payer: Self-pay | Admitting: Physician Assistant

## 2020-06-11 DIAGNOSIS — I10 Essential (primary) hypertension: Secondary | ICD-10-CM

## 2020-06-13 ENCOUNTER — Other Ambulatory Visit: Payer: Self-pay | Admitting: Family Medicine

## 2020-06-16 NOTE — Progress Notes (Signed)
Canceled.

## 2020-06-17 ENCOUNTER — Ambulatory Visit: Payer: PPO | Admitting: Family Medicine

## 2020-06-17 ENCOUNTER — Ambulatory Visit (INDEPENDENT_AMBULATORY_CARE_PROVIDER_SITE_OTHER): Payer: PPO | Admitting: Family Medicine

## 2020-06-17 DIAGNOSIS — E782 Mixed hyperlipidemia: Secondary | ICD-10-CM

## 2020-06-17 DIAGNOSIS — I10 Essential (primary) hypertension: Secondary | ICD-10-CM

## 2020-06-17 DIAGNOSIS — K219 Gastro-esophageal reflux disease without esophagitis: Secondary | ICD-10-CM

## 2020-06-25 ENCOUNTER — Encounter: Payer: Self-pay | Admitting: Podiatry

## 2020-06-25 ENCOUNTER — Other Ambulatory Visit: Payer: Self-pay

## 2020-06-25 ENCOUNTER — Ambulatory Visit: Payer: PPO | Admitting: Podiatry

## 2020-06-25 DIAGNOSIS — I739 Peripheral vascular disease, unspecified: Secondary | ICD-10-CM

## 2020-06-25 DIAGNOSIS — M79675 Pain in left toe(s): Secondary | ICD-10-CM | POA: Diagnosis not present

## 2020-06-25 DIAGNOSIS — B351 Tinea unguium: Secondary | ICD-10-CM | POA: Diagnosis not present

## 2020-06-25 DIAGNOSIS — M79674 Pain in right toe(s): Secondary | ICD-10-CM

## 2020-06-25 NOTE — Progress Notes (Signed)
  Subjective:  Patient ID: Stacy Adams, female    DOB: April 04, 1940,  MRN: [id number]  80 y.o. female presents with for at risk foot care. Patient has h/o PAD and painful thick toenails that are difficult to trim. Pain interferes with ambulation. Aggravating factors include wearing enclosed shoe gear. Pain is relieved with periodic professional debridement.   Husband is present during today's visit.  They voiced no new pedal problems on today's visit.  PCP is Dr. Rochel Brome. Last visit was 06/17/2020.  Review of Systems: Negative except as noted in the HPI.   Allergies  Allergen Reactions  . Ciprofloxacin     Pruritis and related conditions, NOS  . Macrobid [Nitrofurantoin]     Abdominal pain  . Sulfa Antibiotics Other (See Comments)    Swelling years ago    Objective:   Constitutional Pt is a pleasant 80 y.o. Caucasian female in NAD.Marland Kitchen AAO x 3.   Vascular Capillary fill time to digits <3 seconds b/l lower extremities. Faintly palpable DP pulse(s) b/l lower extremities. Nonpalpable PT pulse(s) b/l lower extremities. Pedal hair sparse. Lower extremity skin temperature gradient within normal limits. Varicosities present b/l. No cyanosis or clubbing noted.  Neurologic Normal speech. Oriented to person, place, and time. Protective sensation intact 5/5 intact bilaterally with 10g monofilament b/l.  Dermatologic Pedal skin with normal turgor, texture and tone bilaterally. No open wounds bilaterally. No interdigital macerations bilaterally. Toenails 1-5 b/l elongated, discolored, dystrophic, thickened, crumbly with subungual debris and tenderness to dorsal palpation. She has a new toenail growing from the proximal nail border with old mycotic toenail on top. Distal border incurvated at lateral nailfold. No nail border hypertrophy. No drainage, no erythema, no edema, no fluctuance.  Orthopedic: Normal muscle strength 5/5 to all lower extremity muscle groups bilaterally. No pain crepitus or  joint limitation noted with ROM b/l. Hammertoes noted to the b/l lower extremities.   Radiographs: None Assessment:   1. Pain due to onychomycosis of toenails of both feet   2. PVD (peripheral vascular disease) ([place])    Plan:  Patient was evaluated and treated and all questions answered.  Onychomycosis with pain -Nails palliatively debridement as below. -Educated on self-care  Procedure: Nail Debridement Rationale: Pain Type of Debridement: manual, sharp debridement. Instrumentation: Nail nipper, rotary burr. Number of Nails: 10  -Examined patient. -No new findings. No new orders. -Patient to continue soft, supportive shoe gear daily. -Toenails 1-5 b/l were debrided in length and girth with sterile nail nippers and dremel without iatrogenic bleeding.  -Offending nail border debrided and curretaged R hallux utilizing sterile nail nipper and currette. Border cleansed with alcohol and triple antibiotic applied. No further treatment required by patient/caregiver. -Patient to report any pedal injuries to medical professional immediately. -Patient/POA to call should there be question/concern in the interim.  Return in about 3 months (around 09/24/2020).  Marzetta Board, DPM

## 2020-06-30 ENCOUNTER — Telehealth: Payer: PPO

## 2020-06-30 NOTE — Progress Notes (Deleted)
Chronic Care Management Pharmacy Note  06/30/2020 Name:  Stacy Adams MRN:  [id number] DOB:  [birth year]  Subjective: Stacy Adams is an 80 y.o. year old female who is a primary patient of Cox, Kirsten, MD.  The CCM team was consulted for assistance with disease management and care coordination needs.    Engaged with patient by telephone for follow up visit in response to provider referral for pharmacy case management and/or care coordination services.   Consent to Services:  The patient was given information about Chronic Care Management services, agreed to services, and gave verbal consent prior to initiation of services.  Please see initial visit note for detailed documentation.   Patient Care Team: Rochel Brome, MD as PCP - General (Family Medicine) Burnice Logan, [facility] as Pharmacist (Pharmacist)  Recent office visits: *** 06/17/2020 - cancelled PCP appt. ***needs new appt for labs*** Recent consult visits: *** 06/25/2020 - podiatry - nails debrided.  Hospital visits: {Hospital DC Yes/No:25215}  Objective:  Lab Results  Component Value Date   CREATININE 0.89 12/17/2019   BUN 21 12/17/2019   GFRNONAA 61 12/17/2019   GFRAA 71 12/17/2019   NA 142 12/17/2019   K 4.1 12/17/2019   CALCIUM 9.6 12/17/2019   CO2 28 12/17/2019   GLUCOSE 107 (H) 12/17/2019    Lab Results  Component Value Date/Time   HGBA1C 5.6 09/12/2019 09:58 AM   HGBA1C 5.7 (H) 06/06/2019 09:51 AM    Last diabetic Eye exam: No results found for: HMDIABEYEEXA  Last diabetic Foot exam: No results found for: HMDIABFOOTEX   Lab Results  Component Value Date   CHOL 159 12/17/2019   HDL 37 (L) 12/17/2019   LDLCALC 96 12/17/2019   LDLDIRECT 203.5 01/09/2008   TRIG 149 12/17/2019   CHOLHDL 4.3 12/17/2019    Hepatic Function Latest Ref Rng & Units 12/17/2019 09/12/2019 07/08/2019  Total Protein 6.0 - 8.5 g/dL 6.7 7.2 6.8  Albumin 3.7 - 4.7 g/dL 4.6 4.6 4.6  AST 0 - 40 IU/L '18 25 22  ' ALT 0 - 32  IU/L '16 22 25  ' Alk Phosphatase 44 - 121 IU/L 84 116 118(H)  Total Bilirubin 0.0 - 1.2 mg/dL 0.8 0.8 0.9  Bilirubin, Direct 0.0 - 0.3 mg/dL - - -    Lab Results  Component Value Date/Time   TSH 3.830 03/25/2011 08:02 PM    CBC Latest Ref Rng & Units 12/17/2019 09/12/2019 07/08/2019  WBC 3.4 - 10.8 x10E3/uL 10.4 11.0(H) 10.7  Hemoglobin 11.1 - 15.9 g/dL 13.6 14.3 14.4  Hematocrit 34.0 - 46.6 % 39.5 43.3 42.6  Platelets 150 - 450 x10E3/uL 216 224 197    No results found for: VD25OH  Clinical ASCVD: {YES/NO:21197} The ASCVD Risk score Mikey Bussing DC Jr., et al., 2013) failed to calculate for the following reasons:   The 2013 ASCVD risk score is only valid for ages 98 to 10   The patient has a prior MI or stroke diagnosis    Depression screen [facility] 2/9 02/26/2020 09/12/2019  Decreased Interest 0 0  Down, Depressed, Hopeless 0 0  PHQ - 2 Score 0 0     ***Other: (CHADS2VASc if Afib, MMRC or CAT for COPD, ACT, DEXA)  Social History   Tobacco Use  Smoking Status Never Smoker  Smokeless Tobacco Never Used   BP Readings from Last 3 Encounters:  02/26/20 (!) 172/98  12/17/19 136/80  10/10/19 140/80   Pulse Readings from Last 3 Encounters:  02/26/20 64  12/17/19 60  10/10/19 65   Wt Readings from Last 3 Encounters:  02/26/20 133 lb 6.4 oz (60.5 kg)  12/17/19 132 lb 9.6 oz (60.1 kg)  10/10/19 132 lb (59.9 kg)   BMI Readings from Last 3 Encounters:  02/26/20 26.05 kg/m  12/17/19 25.90 kg/m  10/10/19 25.78 kg/m    Assessment/Interventions: Review of patient past medical history, allergies, medications, health status, including review of consultants reports, laboratory and other test data, was performed as part of comprehensive evaluation and provision of chronic care management services.   SDOH:  (Social Determinants of Health) assessments and interventions performed: {yes/no:20286}  SDOH Screenings   Alcohol Screen: Not on file  Depression (PHQ2-9): Low Risk   . PHQ-2  Score: 0  Financial Resource Strain: Not on file  Food Insecurity: No Food Insecurity  . Worried About Charity fundraiser in the Last Year: Never true  . Ran Out of Food in the Last Year: Never true  Housing: Low Risk   . Last Housing Risk Score: 0  Physical Activity: Not on file  Social Connections: Not on file  Stress: Not on file  Tobacco Use: Low Risk   . Smoking Tobacco Use: Never Smoker  . Smokeless Tobacco Use: Never Used  Transportation Needs: Not on file    CCM Care Plan  Allergies  Allergen Reactions  . Ciprofloxacin     Pruritis and related conditions, NOS  . Macrobid [Nitrofurantoin]     Abdominal pain  . Sulfa Antibiotics Other (See Comments)    Swelling years ago     Medications Reviewed Today    Reviewed by Marzetta Board, DPM (Physician) on 06/25/20 at 1156  Med List Status: <None>  Medication Order Taking? Sig Documenting Provider Last Dose Status Informant  atorvastatin (LIPITOR) 80 MG tablet [id number]  TAKE 1 TABLET BY MOUTH ONCE DAILY. Cox, Kirsten, MD  Active   clopidogrel (PLAVIX) 75 MG tablet [id number]  Take 1 tablet (75 mg total) by mouth in the morning. Cox, Kirsten, MD  Active   lisinopril (ZESTRIL) 20 MG tablet [id number]  TAKE ONE TABLET BY MOUTH EVERY DAY Cox, Kirsten, MD  Active   metoprolol tartrate (LOPRESSOR) 50 MG tablet [id number]  TAKE 2 TABLETS BY MOUTH EVERY MORNING AND 1 TABLET EVERY EVENING. Cox, Kirsten, MD  Active   nitroGLYCERIN (NITROSTAT) 0.4 MG SL tablet [id number] No Place 1 tablet (0.4 mg total) under the tongue every 5 (five) minutes as needed for chest pain. Cox, Kirsten, MD Taking Active   omega-3 acid ethyl esters (LOVAZA) 1 g capsule [id number]  Take 2 capsules (2 g total) by mouth 2 (two) times daily. Rochel Brome, MD  Active   Med List Note Nicole Kindred, CPhT 03/25/11 2134):                   Patient Active Problem List   Diagnosis Date Noted  . Medicare annual wellness visit, subsequent 02/26/2020  . Acute  cystitis with hematuria 07/08/2019  . Hyperkalemia 07/08/2019  . Angina pectoris ([place]) 07/08/2019  . Aphasia 06/13/2019  . 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 Impaired fasting glucose 05/30/2019  . Varicose veins of leg with pain, right 12/29/2015  . PAF (paroxysmal atrial fibrillation) post-CABG 04/02/2011  . S/P CABG x 4: CORONARY ARTERY BYPASS GRAFTING (CABG) X 4 (LIMA TO LAD,SVG to Diagonal,SVG to distal Circumflex, SVG to PDA 03/30/2011  . NSTEMI (non-ST elevated myocardial infarction) Urgent cath 03/25/2011    Class: Hospitalized for  . CAD, multiple vessel  03/25/2011  . ANXIETY 08/24/2007  . Hyperlipidemia with target LDL less than 70 12/21/2006  . Essential hypertension, benign 12/21/2006  . GERD 12/21/2006  . OSTEOPENIA 12/21/2006    Immunization History  Administered Date(s) Administered  . Fluad Quad(high Dose 65+) 12/17/2019  . PFIZER(Purple Top)SARS-COV-2 Vaccination 05/27/2019, 06/16/2019, 01/30/2020  . Td 09/25/1996    Conditions to be addressed/monitored:  {USCCMDZASSESSMENTOPTIONS:23563}  There are no care plans that you recently modified to display for this patient.    Medication Assistance: {MEDASSISTANCEINFO:25044}  Patient's preferred pharmacy is:  [facility], [place] [address] Phone: [phone] Fax: [phone]  Uses pill box? {Yes or If no, why not?:20788} Pt endorses ***% compliance  We discussed: {Pharmacy options:24294} Patient decided to: {US Pharmacy Plan:23885}  Care Plan and Follow Up Patient Decision:  {FOLLOWUP:24991}  Plan: {CM FOLLOW UP PLAN:25073}  ***    Current Barriers:  . {pharmacybarriers:24917} . ***  Pharmacist Clinical Goal(s):  Marland Kitchen Patient will {PHARMACYGOALCHOICES:24921} through collaboration with PharmD and provider.  . ***  Interventions: . 1:1 collaboration with Rochel Brome, MD regarding development and update of  comprehensive plan of care as evidenced by provider attestation and co-signature . Inter-disciplinary care team collaboration (see longitudinal plan of care) . Comprehensive medication review performed; medication list updated in electronic medical record  {CCM PHARMD DISEASE STATES:25130}  Patient Goals/Self-Care Activities . Patient will:  - {pharmacypatientgoals:24919}  Follow Up Plan: {CM FOLLOW UP IXVE:55015}

## 2020-07-17 ENCOUNTER — Telehealth: Payer: Self-pay

## 2020-07-17 NOTE — Progress Notes (Signed)
    Chronic Care Management Pharmacy Assistant   Name: Stacy Adams  MRN: [id number] DOB: 11-15-1940   Reason for Encounter: Disease State for hypertension  Recent office visits:  06/17/20- Dr. Tobie Poet PCP, hyperlipidemia, no medication changes  06/25/20-podiatry, pain with toenails both feet. Follow up 77mos  03/26/20-podiatry, pain with toenails, follow up 31mos  02/26/20-Lisa Corum, AWV,   Recent consult visits:  none  Hospital visits:  None in previous 6 months  Medications: Outpatient Encounter Medications as of 07/17/2020  Medication Sig  . atorvastatin (LIPITOR) 80 MG tablet TAKE 1 TABLET BY MOUTH ONCE DAILY.  Marland Kitchen clopidogrel (PLAVIX) 75 MG tablet Take 1 tablet (75 mg total) by mouth in the morning.  Marland Kitchen lisinopril (ZESTRIL) 20 MG tablet TAKE ONE TABLET BY MOUTH EVERY DAY  . metoprolol tartrate (LOPRESSOR) 50 MG tablet TAKE 2 TABLETS BY MOUTH EVERY MORNING AND 1 TABLET EVERY EVENING.  . nitroGLYCERIN (NITROSTAT) 0.4 MG SL tablet Place 1 tablet (0.4 mg total) under the tongue every 5 (five) minutes as needed for chest pain.  Marland Kitchen omega-3 acid ethyl esters (LOVAZA) 1 g capsule Take 2 capsules (2 g total) by mouth 2 (two) times daily.   No facility-administered encounter medications on file as of 07/17/2020.     Recent Office Vitals: BP Readings from Last 3 Encounters:  02/26/20 (!) 172/98  12/17/19 136/80  10/10/19 140/80   Pulse Readings from Last 3 Encounters:  02/26/20 64  12/17/19 60  10/10/19 65    Wt Readings from Last 3 Encounters:  02/26/20 133 lb 6.4 oz (60.5 kg)  12/17/19 132 lb 9.6 oz (60.1 kg)  10/10/19 132 lb (59.9 kg)     Kidney Function Lab Results  Component Value Date/Time   CREATININE 0.89 12/17/2019 10:11 AM   CREATININE 0.98 10/10/2019 04:01 PM   GFRNONAA 61 12/17/2019 10:11 AM   GFRAA 71 12/17/2019 10:11 AM    BMP Latest Ref Rng & Units 12/17/2019 10/10/2019 09/12/2019  Glucose 65 - 99 mg/dL 107(H) 86 98  BUN 8 - 27 mg/dL 21 14 13    Creatinine 0.57 - 1.00 mg/dL 0.89 0.98 0.92  BUN/Creat Ratio 12 - 28 24 14 14   Sodium 134 - 144 mmol/L 142 144 142  Potassium 3.5 - 5.2 mmol/L 4.1 3.5 3.9  Chloride 96 - 106 mmol/L 102 101 97  CO2 20 - 29 mmol/L 28 28 29   Calcium 8.7 - 10.3 mg/dL 9.6 9.7 9.9   Patient has been unable to reach, have made multiple attempts.   . Current antihypertensive regimen:  Lisinopril    Adherence Review: Is the patient currently on ACE/ARB medication? Yes Does the patient have >5 day gap between last estimated fill dates? CPP to review  Star Rating Drugs: Atorvastatin     06/13/20      90days Clopidogrel      06/11/20      90days Lisinopril           06/11/20      90days Metoprolol         06/11/20       [place], [place] Pharmacist Assistant [phone]

## 2020-08-22 ENCOUNTER — Emergency Department (HOSPITAL_COMMUNITY)
Admission: EM | Admit: 2020-08-22 | Discharge: 2020-08-22 | Disposition: A | Discharge status: Home / Self Care | Payer: PPO | Attending: Emergency Medicine | Admitting: Emergency Medicine

## 2020-08-22 ENCOUNTER — Emergency Department (HOSPITAL_COMMUNITY): Payer: PPO

## 2020-08-22 ENCOUNTER — Other Ambulatory Visit: Payer: Self-pay

## 2020-08-22 ENCOUNTER — Encounter (HOSPITAL_COMMUNITY): Payer: Self-pay | Admitting: Emergency Medicine

## 2020-08-22 DIAGNOSIS — E876 Hypokalemia: Secondary | ICD-10-CM | POA: Diagnosis not present

## 2020-08-22 DIAGNOSIS — R519 Headache, unspecified: Secondary | ICD-10-CM

## 2020-08-22 DIAGNOSIS — I10 Essential (primary) hypertension: Secondary | ICD-10-CM

## 2020-08-22 DIAGNOSIS — Z79899 Other long term (current) drug therapy: Secondary | ICD-10-CM | POA: Insufficient documentation

## 2020-08-22 DIAGNOSIS — I2510 Atherosclerotic heart disease of native coronary artery without angina pectoris: Secondary | ICD-10-CM | POA: Insufficient documentation

## 2020-08-22 DIAGNOSIS — I110 Hypertensive heart disease with heart failure: Secondary | ICD-10-CM | POA: Diagnosis not present

## 2020-08-22 DIAGNOSIS — I509 Heart failure, unspecified: Secondary | ICD-10-CM | POA: Insufficient documentation

## 2020-08-22 DIAGNOSIS — Z955 Presence of coronary angioplasty implant and graft: Secondary | ICD-10-CM | POA: Diagnosis not present

## 2020-08-22 LAB — CBC WITH DIFFERENTIAL/PLATELET
Abs Immature Granulocytes: 0.02 10*3/uL (ref 0.00–0.07)
Basophils Absolute: 0.1 10*3/uL (ref 0.0–0.1)
Basophils Relative: 1 %
Eosinophils Absolute: 0.1 10*3/uL (ref 0.0–0.5)
Eosinophils Relative: 2 %
HCT: 40.8 % (ref 36.0–46.0)
Hemoglobin: 13.8 g/dL (ref 12.0–15.0)
Immature Granulocytes: 0 %
Lymphocytes Relative: 17 %
Lymphs Abs: 1.4 10*3/uL (ref 0.7–4.0)
MCH: 29.5 pg (ref 26.0–34.0)
MCHC: 33.8 g/dL (ref 30.0–36.0)
MCV: 87.2 fL (ref 80.0–100.0)
Monocytes Absolute: 0.5 10*3/uL (ref 0.1–1.0)
Monocytes Relative: 6 %
Neutro Abs: 6.2 10*3/uL (ref 1.7–7.7)
Neutrophils Relative %: 74 %
Platelets: 190 10*3/uL (ref 150–400)
RBC: 4.68 MIL/uL (ref 3.87–5.11)
RDW: 12.8 % (ref 11.5–15.5)
WBC: 8.3 10*3/uL (ref 4.0–10.5)
nRBC: 0 % (ref 0.0–0.2)

## 2020-08-22 LAB — BASIC METABOLIC PANEL
Anion gap: 5 (ref 5–15)
BUN: 16 mg/dL (ref 8–23)
CO2: 30 mmol/L (ref 22–32)
Calcium: 9.2 mg/dL (ref 8.9–10.3)
Chloride: 106 mmol/L (ref 98–111)
Creatinine, Ser: 0.91 mg/dL (ref 0.44–1.00)
GFR, Estimated: >60 mL/min (ref >60)
Glucose, Bld: 108 mg/dL — ABNORMAL HIGH (ref 70–99)
Potassium: 2.9 mmol/L — ABNORMAL LOW (ref 3.5–5.1)
Sodium: 141 mmol/L (ref 135–145)

## 2020-08-22 MED ORDER — CLONIDINE HCL 0.1 MG PO TABS: 0.1000 mg | ORAL_TABLET | Freq: Once | ORAL | Status: DC

## 2020-08-22 MED ORDER — POTASSIUM CHLORIDE CRYS ER 20 MEQ PO TBCR
40.0000 meq | EXTENDED_RELEASE_TABLET | Freq: Once | ORAL | Status: AC
  Administered 2020-08-22: 40 meq via ORAL
  Filled 2020-08-22: qty 2

## 2020-08-22 NOTE — ED Provider Notes (Signed)
[facility] EMERGENCY DEPARTMENT Provider Note   CSN: [id number] Arrival date & time: 08/22/20  1450     History Chief Complaint  Patient presents with  . Headache    Stacy Adams is a 80 y.o. female.  She is a history of hypertension stroke.  She is here with a complaint follow-up of posterior headache that spot on on and off for 3 days.  It sounds like she has had these headaches before.  Similar to priors.  She is tried aspirin for her pain.  Currently has 0 out of 10 pain.  She also has loss of vision in the left eye and decreased vision in the right eye secondary to macular degeneration.  No recent falls.  They are here to figure out why she is having these headaches.  The history is provided by the patient and the spouse.  Headache Pain location:  Occipital Quality:  Dull Radiates to:  L neck and R neck Severity currently:  0/10 Severity at highest:  5/10 Onset quality:  Gradual Timing:  Intermittent Progression:  Unchanged Chronicity:  Recurrent Similar to prior headaches: yes   Relieved by:  Aspirin Worsened by:  Nothing Ineffective treatments:  None tried Associated symptoms: neck pain   Associated symptoms: no abdominal pain, no cough, no eye pain, no fever, no focal weakness, no hearing loss, no loss of balance, no paresthesias and no sore throat        Past Medical History:  Diagnosis Date  . Anxiety   . Aphasia following cerebral infarction   . Blindness left eye category 4, normal vision right eye   . CAD (coronary artery disease), native coronary artery 03/25/2011   80-90% LAD after D1 followed by 60-70% consecutive lesions at T2. Circumflex: 95% ostial, RCA 95-99% --> referred for CABG  . CAD, multiple vessel 08/24/2007  . CHF (congestive heart failure) ([place])   . GERD (gastroesophageal reflux disease)   . Hyperlipidemia 12/21/2006  . HYPERTENSION 12/21/2006  . Hypokalemia   . NSTEMI (non-ST elevated myocardial infarction) Urgent  cath 03/25/2011   Multivessel CAD  . Osteopenia 12/21/2006  . PAF (paroxysmal atrial fibrillation) [facility]) January 2013   Postop A. fib, no recurrence  . S/P CABG x 4 03/26/2011   LIMA-LAD, SVG-Diag, SVG-Cx-OM, SVG-RCA; Intra-Op TEE: EF 60-65%.  . Stroke [facility])    October 2020  . TIA (transient ischemic attack)     Patient Active Problem List   Diagnosis Date Noted  . Medicare annual wellness visit, subsequent 02/26/2020  . Acute cystitis with hematuria 07/08/2019  . Hyperkalemia 07/08/2019  . Angina pectoris ([place]) 07/08/2019  . Aphasia 06/13/2019  . 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 Impaired fasting glucose 05/30/2019  . Varicose veins of leg with pain, right 12/29/2015  . PAF (paroxysmal atrial fibrillation) post-CABG 04/02/2011  . S/P CABG x 4: CORONARY ARTERY BYPASS GRAFTING (CABG) X 4 (LIMA TO LAD,SVG to Diagonal,SVG to distal Circumflex, SVG to PDA 03/30/2011  . NSTEMI (non-ST elevated myocardial infarction) Urgent cath 03/25/2011    Class: Hospitalized for  . CAD, multiple vessel 03/25/2011  . ANXIETY 08/24/2007  . Hyperlipidemia with target LDL less than 70 12/21/2006  . Essential hypertension, benign 12/21/2006  . GERD 12/21/2006  . OSTEOPENIA 12/21/2006    Past Surgical History:  Procedure Laterality Date  . ABDOMINAL HYSTERECTOMY    . CARDIAC CATHETERIZATION  03/25/2011   EF 60 is 55%.80-90% LAD after D1 followed by 60-70% consecutive lesions at D2. Circumflex: 95% ostial,  RCA 95-99% --> referred for CABG  . CORONARY ARTERY BYPASS GRAFT  03/26/2011   Procedure: CORONARY ARTERY BYPASS GRAFTING (CABG);  Surgeon: Grace Isaac, MD;  Location: [facility];  Service: Open Heart Surgery;  Laterality: N/A;  . LEFT HEART CATHETERIZATION WITH CORONARY ANGIOGRAM N/A 03/25/2011   Procedure: LEFT HEART CATHETERIZATION WITH CORONARY ANGIOGRAM;  Surgeon: Leonie Man, MD;  Location: [facility] CATH LAB;  Service: Cardiovascular;  Laterality: N/A;     OB History   No obstetric  history on file.     Family History  Problem Relation Age of Onset  . Alzheimer's disease Mother   . Alzheimer's disease Father   . Cancer Brother   . Lupus Brother     Social History   Tobacco Use  . Smoking status: Never Smoker  . Smokeless tobacco: Never Used  Vaping Use  . Vaping Use: Never used  Substance Use Topics  . Alcohol use: No  . Drug use: No    Home Medications Prior to Admission medications   Medication Sig Start Date End Date Taking? Authorizing Provider  atorvastatin (LIPITOR) 80 MG tablet TAKE 1 TABLET BY MOUTH ONCE DAILY. 06/13/20   Cox, Elnita Maxwell, MD  clopidogrel (PLAVIX) 75 MG tablet Take 1 tablet (75 mg total) by mouth in the morning. 03/12/20   Cox, Elnita Maxwell, MD  lisinopril (ZESTRIL) 20 MG tablet TAKE ONE TABLET BY MOUTH EVERY DAY 06/11/20   Cox, Elnita Maxwell, MD  metoprolol tartrate (LOPRESSOR) 50 MG tablet TAKE 2 TABLETS BY MOUTH EVERY MORNING AND 1 TABLET EVERY EVENING. 06/11/20   Cox, Kirsten, MD  nitroGLYCERIN (NITROSTAT) 0.4 MG SL tablet Place 1 tablet (0.4 mg total) under the tongue every 5 (five) minutes as needed for chest pain. 07/08/19   Cox, Elnita Maxwell, MD  omega-3 acid ethyl esters (LOVAZA) 1 g capsule Take 2 capsules (2 g total) by mouth 2 (two) times daily. 03/30/20   Rochel Brome, MD    Allergies    Ciprofloxacin, Macrobid [nitrofurantoin], and Sulfa antibiotics  Review of Systems   Review of Systems  Constitutional: Negative for fever.  HENT: Negative for hearing loss and sore throat.   Eyes: Positive for visual disturbance. Negative for pain.  Respiratory: Negative for cough and shortness of breath.   Cardiovascular: Negative for chest pain.  Gastrointestinal: Negative for abdominal pain.  Genitourinary: Negative for dysuria.  Musculoskeletal: Positive for neck pain.  Skin: Negative for rash.  Neurological: Positive for headaches. Negative for focal weakness, paresthesias and loss of balance.    Physical Exam Updated Vital Signs BP (!)  259/87 (BP Location: Right Arm)   Pulse 68   Temp 98.1 F (36.7 C)   Resp 16   SpO2 95%   Physical Exam Vitals and nursing note reviewed.  Constitutional:      General: She is not in acute distress.    Appearance: She is well-developed.  HENT:     Head: Normocephalic and atraumatic.  Eyes:     Conjunctiva/sclera: Conjunctivae normal.  Cardiovascular:     Rate and Rhythm: Normal rate and regular rhythm.     Heart sounds: No murmur heard.   Pulmonary:     Effort: Pulmonary effort is normal. No respiratory distress.     Breath sounds: Normal breath sounds.  Abdominal:     Palpations: Abdomen is soft.     Tenderness: There is no abdominal tenderness.  Musculoskeletal:     Cervical back: Neck supple.  Skin:    General: Skin is warm  and dry.  Neurological:     Mental Status: She is alert.     GCS: GCS eye subscore is 4. GCS verbal subscore is 5. GCS motor subscore is 6.     Cranial Nerves: Dysarthria (baseline per husband) present. No facial asymmetry.     Sensory: No sensory deficit.     Motor: No weakness.     ED Results / Procedures / Treatments   Labs (all labs ordered are listed, but only abnormal results are displayed) Labs Reviewed  BASIC METABOLIC PANEL - Abnormal; Notable for the following components:      Result Value   Potassium 2.9 (*)    Glucose, Bld 108 (*)    All other components within normal limits  CBC WITH DIFFERENTIAL/PLATELET    EKG EKG Interpretation  Date/Time:  Saturday Aug 22 2020 19:08:59 EDT Ventricular Rate:  64 PR Interval:  151 QRS Duration: 146 QT Interval:  493 QTC Calculation: 509 R Axis:   37 Text Interpretation: Sinus rhythm Right bundle branch block LVH by voltage Abnormal T, consider ischemia, lateral leads Prolonged QT interval improved rate and rhythm from prior 1/13 Confirmed by Aletta Edouard [id number]) on 08/22/2020 7:10:51 PM   Radiology CT Head Wo Contrast  Result Date: 08/22/2020 CLINICAL DATA:  Headache. EXAM: CT  HEAD WITHOUT CONTRAST TECHNIQUE: Contiguous axial images were obtained from the base of the skull through the vertex without intravenous contrast. COMPARISON:  January 16, 2019 FINDINGS: Brain: No evidence of acute infarction, hemorrhage, hydrocephalus, extra-axial collection or mass lesion/mass effect. Extensive chronic small-vessel ischemic changes. Sequela of prior left basal ganglia infarction. Vascular: Calcific atherosclerotic disease of the intra cavernous carotid arteries. Skull: Normal. Negative for fracture or focal lesion. Sinuses/Orbits: Left ethmoid sinusitis. Other: None. IMPRESSION: 1. No acute intracranial abnormality. 2. Extensive chronic small-vessel ischemic changes. Sequela of prior left basal ganglia infarction. 3. Left ethmoid sinusitis. Electronically Signed   By: Fidela Salisbury M.D.   On: 08/22/2020 18:58    Procedures Procedures   Medications Ordered in ED Medications  potassium chloride SA (KLOR-CON) CR tablet 40 mEq (40 mEq Oral Given 08/22/20 2042)    ED Course  I have reviewed the triage vital signs and the nursing notes.  Pertinent labs & imaging results that were available during my care of the patient were reviewed by me and considered in my medical decision making (see chart for details).    MDM Rules/Calculators/A&P                         This patient complains of occipital headache intermittent and recurrent; this involves an extensive number of treatment Options and is a complaint that carries with it a high risk of complications and Morbidity. The differential includes tension headache, migraine headache, generalized, bleed, tumor  I ordered, reviewed and interpreted labs, which included CBC with normal white count normal hemoglobin, chemistries with low potassium normal renal function I ordered medication oral potassium supplementation I ordered imaging studies which included CT head and I independently    visualized and interpreted imaging which  showed no acute findings Additional history obtained from patient's husband Previous records obtained and reviewed in epic, I actually saw her a few years ago for other similar complaint  After the interventions stated above, I reevaluated the patient and found patient remain headache free.  Blood pressure still mildly elevated.  Recommended she contact her primary care doctor for close follow-up.  Potentially will also need neurology follow-up.  Final Clinical Impression(s) / ED Diagnoses Final diagnoses:  Occipital headache  Primary hypertension  Hypokalemia    Rx / DC Orders ED Discharge Orders    None       Hayden Rasmussen, MD 08/22/20 2101

## 2020-08-22 NOTE — ED Notes (Signed)
Dc instructions reviewed with pt. PT verbalized understanding. PT Dc

## 2020-08-22 NOTE — Discharge Instructions (Addendum)
You are seen in the emergency department for a headache in the back of your head and elevated blood pressure.  You had blood work and a CAT scan that did not show any serious findings.  Your Potassium was low and you were given a potassium supplement in the emergency department.  Please contact your primary care doctor for close follow-up.  Continue your regular blood pressure medication.

## 2020-08-22 NOTE — ED Triage Notes (Signed)
C/o severe posterior headache x 3 days.  No arm drift.  History of stroke.  Denies numbness/weakness.  Pt hypertensive.  Taking BP medication as instructed.

## 2020-08-28 ENCOUNTER — Telehealth: Payer: Self-pay

## 2020-08-28 NOTE — Progress Notes (Signed)
    Chronic Care Management Pharmacy Assistant   Name: Stacy Adams  MRN: [id number] DOB: [birth year]  Reason for Encounter: Disease State for hypertension  Recent office visits:  06/17/20- Dr. Tobie Poet PCP, hyperlipidemia, no medication changes   06/25/20-podiatry, pain with toenails both feet. Follow up 9mos   03/26/20-podiatry, pain with toenails, follow up 5mos   02/26/20-Lisa Corum, AWV,   Recent consult visits:  none  Hospital visits:  08/22/20-ED and discharge- occipital headache, She also has loss of vision in the left eye and decreased vision in the right eye secondary to macular degeneration.  No recent falls  Medications: Outpatient Encounter Medications as of 08/28/2020  Medication Sig   atorvastatin (LIPITOR) 80 MG tablet TAKE 1 TABLET BY MOUTH ONCE DAILY.   clopidogrel (PLAVIX) 75 MG tablet Take 1 tablet (75 mg total) by mouth in the morning.   lisinopril (ZESTRIL) 20 MG tablet TAKE ONE TABLET BY MOUTH EVERY DAY   metoprolol tartrate (LOPRESSOR) 50 MG tablet TAKE 2 TABLETS BY MOUTH EVERY MORNING AND 1 TABLET EVERY EVENING.   nitroGLYCERIN (NITROSTAT) 0.4 MG SL tablet Place 1 tablet (0.4 mg total) under the tongue every 5 (five) minutes as needed for chest pain.   omega-3 acid ethyl esters (LOVAZA) 1 g capsule Take 2 capsules (2 g total) by mouth 2 (two) times daily.   No facility-administered encounter medications on file as of 08/28/2020.     Recent Office Vitals: BP Readings from Last 3 Encounters:  08/22/20 (!) 187/97  02/26/20 (!) 172/98  12/17/19 136/80   Pulse Readings from Last 3 Encounters:  08/22/20 (!) 57  02/26/20 64  12/17/19 60    Wt Readings from Last 3 Encounters:  02/26/20 133 lb 6.4 oz (60.5 kg)  12/17/19 132 lb 9.6 oz (60.1 kg)  10/10/19 132 lb (59.9 kg)     Kidney Function Lab Results  Component Value Date/Time   CREATININE 0.91 08/22/2020 03:58 PM   CREATININE 0.89 12/17/2019 10:11 AM   GFRNONAA >60 08/22/2020 03:58 PM   GFRAA 71  12/17/2019 10:11 AM    BMP Latest Ref Rng & Units 08/22/2020 12/17/2019 10/10/2019  Glucose 70 - 99 mg/dL 108(H) 107(H) 86  BUN 8 - 23 mg/dL 16 21 14   Creatinine 0.44 - 1.00 mg/dL 0.91 0.89 0.98  BUN/Creat Ratio 12 - 28 - 24 14  Sodium 135 - 145 mmol/L 141 142 144  Potassium 3.5 - 5.1 mmol/L 2.9(L) 4.1 3.5  Chloride 98 - 111 mmol/L 106 102 101  CO2 22 - 32 mmol/L 30 28 28   Calcium 8.9 - 10.3 mg/dL 9.2 9.6 9.7   Unable to reach patient   Current antihypertensive regimen:  Lisinopril    Adherence Review: Is the patient currently on ACE/ARB medication? Yes Does the patient have >5 day gap between last estimated fill dates? CPP to review   Star Rating Drugs:  Medication:  Last Fill: Day Supply Atorvastatin  06/13/20 90    Clopidogrel  06/11/20 90 Lisinopril  06/11/20 90 Metoprolol  06/11/20 90  GAPS: AWV: 02/26/20   Clarita Leber, [place] Pharmacist Assistant [phone]

## 2020-09-10 DIAGNOSIS — I10 Essential (primary) hypertension: Secondary | ICD-10-CM | POA: Diagnosis not present

## 2020-09-10 DIAGNOSIS — Z8673 Personal history of transient ischemic attack (TIA), and cerebral infarction without residual deficits: Secondary | ICD-10-CM | POA: Diagnosis not present

## 2020-09-10 DIAGNOSIS — E785 Hyperlipidemia, unspecified: Secondary | ICD-10-CM | POA: Diagnosis not present

## 2020-09-10 DIAGNOSIS — R519 Headache, unspecified: Secondary | ICD-10-CM | POA: Diagnosis not present

## 2020-09-11 DIAGNOSIS — R001 Bradycardia, unspecified: Secondary | ICD-10-CM | POA: Diagnosis not present

## 2020-09-11 DIAGNOSIS — I517 Cardiomegaly: Secondary | ICD-10-CM | POA: Diagnosis not present

## 2020-09-11 DIAGNOSIS — I4510 Unspecified right bundle-branch block: Secondary | ICD-10-CM | POA: Diagnosis not present

## 2020-10-22 ENCOUNTER — Ambulatory Visit: Payer: PPO | Admitting: Podiatry

## 2020-11-03 ENCOUNTER — Telehealth: Payer: PPO

## 2020-11-03 NOTE — Progress Notes (Deleted)
Chronic Care Management Pharmacy Note  11/03/2020 Name:  Stacy Adams MRN:  [id number] DOB:  1941/02/05  Summary: ***  Recommendations/Changes made from today's visit: ***  Plan: ***   Subjective: Stacy Adams is an 80 y.o. year old female who is a primary patient of Cox, Kirsten, MD.  The CCM team was consulted for assistance with disease management and care coordination needs.    {CCMTELEPHONEFACETOFACE:21091510} for {CCMINITIALFOLLOWUPCHOICE:21091511} in response to provider referral for pharmacy case management and/or care coordination services.   Consent to Services:  {CCMCONSENTOPTIONS:25074}  Patient Care Team: Rochel Brome, MD as PCP - General (Family Medicine) Burnice Logan, [facility] as Pharmacist (Pharmacist)  Recent office visits: ***   Recent consult visits: [facility] Site visits: {Hospital DC Yes/No:25215}   Objective:  Lab Results  Component Value Date   CREATININE 0.91 08/22/2020   BUN 16 08/22/2020   GFRNONAA >60 08/22/2020   GFRAA 71 12/17/2019   NA 141 08/22/2020   K 2.9 (L) 08/22/2020   CALCIUM 9.2 08/22/2020   CO2 30 08/22/2020   GLUCOSE 108 (H) 08/22/2020    Lab Results  Component Value Date/Time   HGBA1C 5.6 09/12/2019 09:58 AM   HGBA1C 5.7 (H) 06/06/2019 09:51 AM    Last diabetic Eye exam: No results found for: HMDIABEYEEXA  Last diabetic Foot exam: No results found for: HMDIABFOOTEX   Lab Results  Component Value Date   CHOL 159 12/17/2019   HDL 37 (L) 12/17/2019   LDLCALC 96 12/17/2019   LDLDIRECT 203.5 01/09/2008   TRIG 149 12/17/2019   CHOLHDL 4.3 12/17/2019    Hepatic Function Latest Ref Rng & Units 12/17/2019 09/12/2019 07/08/2019  Total Protein 6.0 - 8.5 g/dL 6.7 7.2 6.8  Albumin 3.7 - 4.7 g/dL 4.6 4.6 4.6  AST 0 - 40 IU/L '18 25 22  ' ALT 0 - 32 IU/L '16 22 25  ' Alk Phosphatase 44 - 121 IU/L 84 116 118(H)  Total Bilirubin 0.0 - 1.2 mg/dL 0.8 0.8 0.9  Bilirubin, Direct 0.0 - 0.3 mg/dL - - -    Lab Results   Component Value Date/Time   TSH 3.830 03/25/2011 08:02 PM    CBC Latest Ref Rng & Units 08/22/2020 12/17/2019 09/12/2019  WBC 4.0 - 10.5 K/uL 8.3 10.4 11.0(H)  Hemoglobin 12.0 - 15.0 g/dL 13.8 13.6 14.3  Hematocrit 36.0 - 46.0 % 40.8 39.5 43.3  Platelets 150 - 400 K/uL 190 216 224    No results found for: VD25OH  Clinical ASCVD: {YES/NO:21197} The ASCVD Risk score Mikey Bussing DC Jr., et al., 2013) failed to calculate for the following reasons:   The 2013 ASCVD risk score is only valid for ages 57 to 39   The patient has a prior MI or stroke diagnosis    Depression screen [facility] 2/9 02/26/2020 09/12/2019  Decreased Interest 0 0  Down, Depressed, Hopeless 0 0  PHQ - 2 Score 0 0     ***Other: (CHADS2VASc if Afib, MMRC or CAT for COPD, ACT, DEXA)  Social History   Tobacco Use  Smoking Status Never  Smokeless Tobacco Never   BP Readings from Last 3 Encounters:  08/22/20 (!) 187/97  02/26/20 (!) 172/98  12/17/19 136/80   Pulse Readings from Last 3 Encounters:  08/22/20 (!) 57  02/26/20 64  12/17/19 60   Wt Readings from Last 3 Encounters:  02/26/20 133 lb 6.4 oz (60.5 kg)  12/17/19 132 lb 9.6 oz (60.1 kg)  10/10/19 132 lb (59.9 kg)  BMI Readings from Last 3 Encounters:  02/26/20 26.05 kg/m  12/17/19 25.90 kg/m  10/10/19 25.78 kg/m    Assessment/Interventions: Review of patient past medical history, allergies, medications, health status, including review of consultants reports, laboratory and other test data, was performed as part of comprehensive evaluation and provision of chronic care management services.   SDOH:  (Social Determinants of Health) assessments and interventions performed: {yes/no:20286}  SDOH Screenings   Alcohol Screen: Not on file  Depression (PHQ2-9): Low Risk    PHQ-2 Score: 0  Financial Resource Strain: Not on file  Food Insecurity: No Food Insecurity   Worried About Charity fundraiser in the Last Year: Never true   Ran Out of Food in the Last  Year: Never true  Housing: [place] Risk Score: 0  Physical Activity: Not on file  Social Connections: Not on file  Stress: Not on file  Tobacco Use: Low Risk    Smoking Tobacco Use: Never   Smokeless Tobacco Use: Never  Transportation Needs: Not on file    [place]  Allergies  Allergen Reactions   Ciprofloxacin     Pruritis and related conditions, NOS   Macrobid [Nitrofurantoin]     Abdominal pain   Sulfa Antibiotics Other (See Comments)    Swelling years ago     Medications Reviewed Today     Reviewed by Marzetta Board, DPM (Physician) on 06/25/20 at 1156  Med List Status: <None>   Medication Order Taking? Sig Documenting Provider Last Dose Status Informant  atorvastatin (LIPITOR) 80 MG tablet [id number]  TAKE 1 TABLET BY MOUTH ONCE DAILY. Cox, Kirsten, MD  Active   clopidogrel (PLAVIX) 75 MG tablet [id number]  Take 1 tablet (75 mg total) by mouth in the morning. Cox, Kirsten, MD  Active   lisinopril (ZESTRIL) 20 MG tablet [id number]  TAKE ONE TABLET BY MOUTH EVERY DAY Cox, Kirsten, MD  Active   metoprolol tartrate (LOPRESSOR) 50 MG tablet [id number]  TAKE 2 TABLETS BY MOUTH EVERY MORNING AND 1 TABLET EVERY EVENING. Cox, Kirsten, MD  Active   nitroGLYCERIN (NITROSTAT) 0.4 MG SL tablet [id number] Place 1 tablet (0.4 mg total) under the tongue every 5 (five) minutes as needed for chest pain. Cox, Kirsten, MD Taking Active   omega-3 acid ethyl esters (LOVAZA) 1 g capsule [id number]  Take 2 capsules (2 g total) by mouth 2 (two) times daily. Rochel Brome, MD  Active   Med List Note Nicole Kindred, CPhT 03/25/11 2134):                     Patient Active Problem List   Diagnosis Date Noted   Medicare annual wellness visit, subsequent 02/26/2020   Acute cystitis with hematuria 07/08/2019   Hyperkalemia 07/08/2019   Angina pectoris ([place]) 07/08/2019   Aphasia 06/13/2019   Sequelae, post-stroke 06/13/2019   Impaired fasting glucose 05/30/2019    Varicose veins of leg with pain, right 12/29/2015   PAF (paroxysmal atrial fibrillation) post-CABG 04/02/2011   S/P CABG x 4: CORONARY ARTERY BYPASS GRAFTING (CABG) X 4 (LIMA TO LAD,SVG to Diagonal,SVG to distal Circumflex, SVG to PDA 03/30/2011   NSTEMI (non-ST elevated myocardial infarction) Urgent cath 03/25/2011    Class: Hospitalized for   CAD, multiple vessel 03/25/2011   ANXIETY 08/24/2007   Hyperlipidemia with target LDL less than 70 12/21/2006   Essential hypertension, benign 12/21/2006   GERD 12/21/2006   OSTEOPENIA 12/21/2006  Immunization History  Administered Date(s) Administered   Fluad Quad(high Dose 65+) 12/17/2019   PFIZER(Purple Top)SARS-COV-2 Vaccination 05/27/2019, 06/16/2019, 01/30/2020   Td 09/25/1996    Conditions to be addressed/monitored:  {USCCMDZASSESSMENTOPTIONS:23563}  There are no care plans that you recently modified to display for this patient.    Medication Assistance: {MEDASSISTANCEINFO:25044}  Compliance/Adherence/Medication fill history: Care Gaps: ***  Star-Rating Drugs: ***  Patient's preferred pharmacy is:  [facility], [address] [place] [address] Phone: [phone] Fax: [phone]  Uses pill box? {Yes or If no, why not?:20788} Pt endorses ***% compliance  We discussed: {Pharmacy options:24294} Patient decided to: {US Pharmacy Plan:23885}  Care Plan and Follow Up Patient Decision:  {FOLLOWUP:24991}  Plan: {CM FOLLOW UP RXYV:85929}  ***    Current Barriers:  {pharmacybarriers:24917}  Pharmacist Clinical Goal(s):  Patient will {PHARMACYGOALCHOICES:24921} through collaboration with PharmD and provider.   Interventions: 1:1 collaboration with Rochel Brome, MD regarding development and update of comprehensive plan of care as evidenced by provider attestation and co-signature Inter-disciplinary care team collaboration (see longitudinal plan of  care) Comprehensive medication review performed; medication list updated in electronic medical record  {CCM PHARMD DISEASE STATES:25130}  Patient Goals/Self-Care Activities Patient will:  - {pharmacypatientgoals:24919}  Follow Up Plan: {CM FOLLOW UP WKMQ:28638}

## 2020-11-17 ENCOUNTER — Other Ambulatory Visit: Payer: Self-pay

## 2020-11-17 ENCOUNTER — Encounter: Payer: Self-pay | Admitting: Sports Medicine

## 2020-11-17 ENCOUNTER — Ambulatory Visit: Payer: PPO | Admitting: Sports Medicine

## 2020-11-17 DIAGNOSIS — M79675 Pain in left toe(s): Secondary | ICD-10-CM | POA: Diagnosis not present

## 2020-11-17 DIAGNOSIS — M79674 Pain in right toe(s): Secondary | ICD-10-CM

## 2020-11-17 DIAGNOSIS — B351 Tinea unguium: Secondary | ICD-10-CM

## 2020-11-17 DIAGNOSIS — I739 Peripheral vascular disease, unspecified: Secondary | ICD-10-CM | POA: Diagnosis not present

## 2020-11-17 NOTE — Progress Notes (Signed)
Subjective: Stacy Adams is a 80 y.o. female patient seen today in office with complaint of mildly painful thickened and elongated toenails; unable to trim. Patient has no other pedal complaints at this time.   Patient Active Problem List   Diagnosis Date Noted   Medicare annual wellness visit, subsequent 02/26/2020   Acute cystitis with hematuria 07/08/2019   Hyperkalemia 07/08/2019   Angina pectoris ([place]) 07/08/2019   Aphasia 06/13/2019   Sequelae, post-stroke 06/13/2019   Impaired fasting glucose 05/30/2019   Varicose veins of leg with pain, right 12/29/2015   PAF (paroxysmal atrial fibrillation) post-CABG 04/02/2011   S/P CABG x 4: CORONARY ARTERY BYPASS GRAFTING (CABG) X 4 (LIMA TO LAD,SVG to Diagonal,SVG to distal Circumflex, SVG to PDA 03/30/2011   NSTEMI (non-ST elevated myocardial infarction) Urgent cath 03/25/2011    Class: Hospitalized for   CAD, multiple vessel 03/25/2011   ANXIETY 08/24/2007   Hyperlipidemia with target LDL less than 70 12/21/2006   Essential hypertension, benign 12/21/2006   GERD 12/21/2006   OSTEOPENIA 12/21/2006    Current Outpatient Medications on File Prior to Visit  Medication Sig Dispense Refill   atorvastatin (LIPITOR) 80 MG tablet TAKE 1 TABLET BY MOUTH ONCE DAILY. 90 tablet 0   clopidogrel (PLAVIX) 75 MG tablet Take 1 tablet (75 mg total) by mouth in the morning. 90 tablet 1   lisinopril (ZESTRIL) 20 MG tablet TAKE ONE TABLET BY MOUTH EVERY DAY 90 tablet 0   metoprolol tartrate (LOPRESSOR) 50 MG tablet TAKE 2 TABLETS BY MOUTH EVERY MORNING AND 1 TABLET EVERY EVENING. 270 tablet 0   nitroGLYCERIN (NITROSTAT) 0.4 MG SL tablet Place 1 tablet (0.4 mg total) under the tongue every 5 (five) minutes as needed for chest pain. 50 tablet 3   omega-3 acid ethyl esters (LOVAZA) 1 g capsule Take 2 capsules (2 g total) by mouth 2 (two) times daily. 360 capsule 1   No current facility-administered medications on file prior to visit.    Allergies   Allergen Reactions   Ciprofloxacin     Pruritis and related conditions, NOS   Macrobid [Nitrofurantoin]     Abdominal pain   Sulfa Antibiotics Other (See Comments)    Swelling years ago     Objective: Physical Exam  General: Well developed, nourished, no acute distress, awake, alert and oriented x 3  Vascular: Dorsalis pedis artery 1/4 bilateral, Posterior tibial artery 0/4 bilateral due to trace edema at ankles, skin temperature warm to warm proximal to distal bilateral lower extremities, mild varicosities, pedal hair present bilateral.  Neurological: Gross sensation present via light touch bilateral.   Dermatological: Skin is warm, dry, and supple bilateral, Nails 1-10 are tender, long, thick, and discolored with mild subungal debris, no webspace macerations present bilateral, no open lesions present bilateral, no callus/corns/hyperkeratotic tissue present bilateral. No signs of infection bilateral.  Musculoskeletal: Asymptomatic mild hammertoe boney deformities noted bilateral. Muscular strength within normal limits without painon range of motion. No pain with calf compression bilateral.  Assessment and Plan:  Problem List Items Addressed This Visit   None Visit Diagnoses     Pain due to onychomycosis of toenails of both feet    -  Primary   PVD (peripheral vascular disease) ([place])           -Examined patient.  -Discussed treatment options for painful mycotic nails. -Mechanically debrided and reduced mycotic nails with sterile nail nipper and dremel nail file without incident. -Patient to return in 3 months for follow up  evaluation or sooner if symptoms worsen.  Landis Martins, DPM

## 2020-12-08 DIAGNOSIS — N644 Mastodynia: Secondary | ICD-10-CM | POA: Diagnosis not present

## 2020-12-08 DIAGNOSIS — L821 Other seborrheic keratosis: Secondary | ICD-10-CM | POA: Diagnosis not present

## 2020-12-08 DIAGNOSIS — Z8673 Personal history of transient ischemic attack (TIA), and cerebral infarction without residual deficits: Secondary | ICD-10-CM | POA: Diagnosis not present

## 2020-12-08 DIAGNOSIS — Z79899 Other long term (current) drug therapy: Secondary | ICD-10-CM | POA: Diagnosis not present

## 2020-12-08 DIAGNOSIS — Z7902 Long term (current) use of antithrombotics/antiplatelets: Secondary | ICD-10-CM | POA: Diagnosis not present

## 2020-12-08 DIAGNOSIS — Z0001 Encounter for general adult medical examination with abnormal findings: Secondary | ICD-10-CM | POA: Diagnosis not present

## 2020-12-08 DIAGNOSIS — D229 Melanocytic nevi, unspecified: Secondary | ICD-10-CM | POA: Diagnosis not present

## 2020-12-08 DIAGNOSIS — N6320 Unspecified lump in the left breast, unspecified quadrant: Secondary | ICD-10-CM | POA: Diagnosis not present

## 2020-12-08 DIAGNOSIS — E785 Hyperlipidemia, unspecified: Secondary | ICD-10-CM | POA: Diagnosis not present

## 2020-12-08 DIAGNOSIS — F419 Anxiety disorder, unspecified: Secondary | ICD-10-CM | POA: Diagnosis not present

## 2020-12-08 DIAGNOSIS — R42 Dizziness and giddiness: Secondary | ICD-10-CM | POA: Diagnosis not present

## 2020-12-08 DIAGNOSIS — I10 Essential (primary) hypertension: Secondary | ICD-10-CM | POA: Diagnosis not present

## 2020-12-08 DIAGNOSIS — E876 Hypokalemia: Secondary | ICD-10-CM | POA: Diagnosis not present

## 2020-12-10 DIAGNOSIS — Z79899 Other long term (current) drug therapy: Secondary | ICD-10-CM | POA: Diagnosis not present

## 2020-12-10 DIAGNOSIS — F419 Anxiety disorder, unspecified: Secondary | ICD-10-CM | POA: Diagnosis not present

## 2020-12-10 DIAGNOSIS — Z8673 Personal history of transient ischemic attack (TIA), and cerebral infarction without residual deficits: Secondary | ICD-10-CM | POA: Diagnosis not present

## 2020-12-10 DIAGNOSIS — E876 Hypokalemia: Secondary | ICD-10-CM | POA: Diagnosis not present

## 2020-12-10 DIAGNOSIS — Z0000 Encounter for general adult medical examination without abnormal findings: Secondary | ICD-10-CM | POA: Diagnosis not present

## 2020-12-10 DIAGNOSIS — D229 Melanocytic nevi, unspecified: Secondary | ICD-10-CM | POA: Diagnosis not present

## 2020-12-10 DIAGNOSIS — E785 Hyperlipidemia, unspecified: Secondary | ICD-10-CM | POA: Diagnosis not present

## 2020-12-10 DIAGNOSIS — I10 Essential (primary) hypertension: Secondary | ICD-10-CM | POA: Diagnosis not present

## 2020-12-10 DIAGNOSIS — Z7902 Long term (current) use of antithrombotics/antiplatelets: Secondary | ICD-10-CM | POA: Diagnosis not present

## 2020-12-16 DIAGNOSIS — E876 Hypokalemia: Secondary | ICD-10-CM | POA: Diagnosis not present

## 2021-01-02 DIAGNOSIS — Z23 Encounter for immunization: Secondary | ICD-10-CM | POA: Diagnosis not present

## 2021-02-23 ENCOUNTER — Encounter: Payer: Self-pay | Admitting: Sports Medicine

## 2021-02-23 ENCOUNTER — Ambulatory Visit: Payer: PPO | Admitting: Sports Medicine

## 2021-02-23 DIAGNOSIS — B351 Tinea unguium: Secondary | ICD-10-CM | POA: Diagnosis not present

## 2021-02-23 DIAGNOSIS — M79675 Pain in left toe(s): Secondary | ICD-10-CM | POA: Diagnosis not present

## 2021-02-23 DIAGNOSIS — M79674 Pain in right toe(s): Secondary | ICD-10-CM

## 2021-02-23 DIAGNOSIS — I739 Peripheral vascular disease, unspecified: Secondary | ICD-10-CM

## 2021-02-23 NOTE — Progress Notes (Signed)
Subjective: Stacy Adams is a 80 y.o. female patient seen today in office with complaint of mildly painful thickened and elongated toenails; unable to trim. Patient has no other pedal complaints at this time.   Patient Active Problem List   Diagnosis Date Noted   Medicare annual wellness visit, subsequent 02/26/2020   Acute cystitis with hematuria 07/08/2019   Hyperkalemia 07/08/2019   Angina pectoris ([place]) 07/08/2019   Aphasia 06/13/2019   Sequelae, post-stroke 06/13/2019   Impaired fasting glucose 05/30/2019   Varicose veins of leg with pain, right 12/29/2015   PAF (paroxysmal atrial fibrillation) post-CABG 04/02/2011   S/P CABG x 4: CORONARY ARTERY BYPASS GRAFTING (CABG) X 4 (LIMA TO LAD,SVG to Diagonal,SVG to distal Circumflex, SVG to PDA 03/30/2011   NSTEMI (non-ST elevated myocardial infarction) Urgent cath 03/25/2011    Class: Hospitalized for   CAD, multiple vessel 03/25/2011   ANXIETY 08/24/2007   Hyperlipidemia with target LDL less than 70 12/21/2006   Essential hypertension, benign 12/21/2006   GERD 12/21/2006   OSTEOPENIA 12/21/2006    Current Outpatient Medications on File Prior to Visit  Medication Sig Dispense Refill   atorvastatin (LIPITOR) 80 MG tablet TAKE 1 TABLET BY MOUTH ONCE DAILY. 90 tablet 0   clopidogrel (PLAVIX) 75 MG tablet Take 1 tablet (75 mg total) by mouth in the morning. 90 tablet 1   lisinopril (ZESTRIL) 20 MG tablet TAKE ONE TABLET BY MOUTH EVERY DAY 90 tablet 0   metoprolol tartrate (LOPRESSOR) 50 MG tablet TAKE 2 TABLETS BY MOUTH EVERY MORNING AND 1 TABLET EVERY EVENING. 270 tablet 0   nitroGLYCERIN (NITROSTAT) 0.4 MG SL tablet Place 1 tablet (0.4 mg total) under the tongue every 5 (five) minutes as needed for chest pain. 50 tablet 3   omega-3 acid ethyl esters (LOVAZA) 1 g capsule Take 2 capsules (2 g total) by mouth 2 (two) times daily. 360 capsule 1   No current facility-administered medications on file prior to visit.    Allergies   Allergen Reactions   Ciprofloxacin     Pruritis and related conditions, NOS   Macrobid [Nitrofurantoin]     Abdominal pain   Sulfa Antibiotics Other (See Comments)    Swelling years ago     Objective: Physical Exam  General: Well developed, nourished, no acute distress, awake, alert and oriented x 3  Vascular: Dorsalis pedis artery 1/4 bilateral, Posterior tibial artery 0/4 bilateral due to trace edema at ankles, skin temperature warm to warm proximal to distal bilateral lower extremities, mild varicosities, pedal hair present bilateral.  Neurological: Gross sensation present via light touch bilateral.   Dermatological: Skin is warm, dry, and supple bilateral, Nails 1-10 are tender, long, thick, and discolored with mild subungal debris, no webspace macerations present bilateral, no open lesions present bilateral, no callus/corns/hyperkeratotic tissue present bilateral. No signs of infection bilateral.  Musculoskeletal: Asymptomatic mild hammertoe boney deformities noted bilateral. Muscular strength within normal limits without painon range of motion. No pain with calf compression bilateral.  Assessment and Plan:  Problem List Items Addressed This Visit   None Visit Diagnoses     Pain due to onychomycosis of toenails of both feet    -  Primary   PVD (peripheral vascular disease) ([place])           -Examined patient.  -Discussed treatment options for painful mycotic nails. -Mechanically debrided and reduced mycotic nails with sterile nail nipper and dremel nail file without incident. -Patient to return in 3 months for follow up  evaluation or sooner if symptoms worsen.  Landis Martins, DPM

## 2021-03-17 DIAGNOSIS — Z8673 Personal history of transient ischemic attack (TIA), and cerebral infarction without residual deficits: Secondary | ICD-10-CM | POA: Diagnosis not present

## 2021-03-17 DIAGNOSIS — Z9181 History of falling: Secondary | ICD-10-CM | POA: Diagnosis not present

## 2021-03-17 DIAGNOSIS — I10 Essential (primary) hypertension: Secondary | ICD-10-CM | POA: Diagnosis not present

## 2021-03-17 DIAGNOSIS — E785 Hyperlipidemia, unspecified: Secondary | ICD-10-CM | POA: Diagnosis not present

## 2021-03-17 DIAGNOSIS — R42 Dizziness and giddiness: Secondary | ICD-10-CM | POA: Diagnosis not present

## 2021-04-01 DIAGNOSIS — R42 Dizziness and giddiness: Secondary | ICD-10-CM | POA: Diagnosis not present

## 2021-04-01 DIAGNOSIS — I6782 Cerebral ischemia: Secondary | ICD-10-CM | POA: Diagnosis not present

## 2021-06-15 DIAGNOSIS — K5900 Constipation, unspecified: Secondary | ICD-10-CM | POA: Diagnosis not present

## 2021-06-15 DIAGNOSIS — I10 Essential (primary) hypertension: Secondary | ICD-10-CM | POA: Diagnosis not present

## 2021-06-15 DIAGNOSIS — E785 Hyperlipidemia, unspecified: Secondary | ICD-10-CM | POA: Diagnosis not present

## 2021-06-18 ENCOUNTER — Ambulatory Visit: Payer: PPO | Admitting: Sports Medicine

## 2021-06-18 ENCOUNTER — Encounter: Payer: Self-pay | Admitting: Sports Medicine

## 2021-06-18 ENCOUNTER — Other Ambulatory Visit: Payer: Self-pay

## 2021-06-18 DIAGNOSIS — B351 Tinea unguium: Secondary | ICD-10-CM | POA: Diagnosis not present

## 2021-06-18 DIAGNOSIS — I739 Peripheral vascular disease, unspecified: Secondary | ICD-10-CM | POA: Diagnosis not present

## 2021-06-18 DIAGNOSIS — M79675 Pain in left toe(s): Secondary | ICD-10-CM

## 2021-06-18 DIAGNOSIS — M79674 Pain in right toe(s): Secondary | ICD-10-CM

## 2021-06-18 NOTE — Progress Notes (Signed)
Subjective: ?Stacy Adams is a 81 y.o. female patient seen today in office with complaint of mildly painful thickened and elongated toenails; unable to trim. Patient has no other pedal complaints at this time.  ? ?Patient is assisted by her brother this visit.  ? ?Patient Active Problem List  ? Diagnosis Date Noted  ? Medicare annual wellness visit, subsequent 02/26/2020  ? Acute cystitis with hematuria 07/08/2019  ? Hyperkalemia 07/08/2019  ? Angina pectoris ([place]) 07/08/2019  ? Aphasia 06/13/2019  ? Sequelae, post-stroke 06/13/2019  ? Impaired fasting glucose 05/30/2019  ? Varicose veins of leg with pain, right 12/29/2015  ? PAF (paroxysmal atrial fibrillation) post-CABG 04/02/2011  ? S/P CABG x 4: CORONARY ARTERY BYPASS GRAFTING (CABG) X 4 (LIMA TO LAD,SVG to Diagonal,SVG to distal Circumflex, SVG to PDA 03/30/2011  ? NSTEMI (non-ST elevated myocardial infarction) Urgent cath 03/25/2011  ?  Class: Hospitalized for  ? CAD, multiple vessel 03/25/2011  ? ANXIETY 08/24/2007  ? Hyperlipidemia with target LDL less than 70 12/21/2006  ? Essential hypertension, benign 12/21/2006  ? GERD 12/21/2006  ? OSTEOPENIA 12/21/2006  ? ? ?Current Outpatient Medications on File Prior to Visit  ?Medication Sig Dispense Refill  ? lactulose (CHRONULAC) 10 GM/15ML solution Take by mouth.    ? potassium chloride (KLOR-CON) 10 MEQ tablet Take 1 tablet by mouth daily.    ? amLODipine (NORVASC) 5 MG tablet Take 5 mg by mouth daily.    ? atorvastatin (LIPITOR) 80 MG tablet TAKE 1 TABLET BY MOUTH ONCE DAILY. 90 tablet 0  ? clopidogrel (PLAVIX) 75 MG tablet Take 1 tablet (75 mg total) by mouth in the morning. 90 tablet 1  ? ENULOSE 10 GM/15ML SOLN Take 10 g by mouth daily.    ? hydrochlorothiazide (MICROZIDE) 12.5 MG capsule Take 12.5 mg by mouth daily.    ? lisinopril (ZESTRIL) 20 MG tablet TAKE ONE TABLET BY MOUTH EVERY DAY 90 tablet 0  ? metoprolol tartrate (LOPRESSOR) 50 MG tablet TAKE 2 TABLETS BY MOUTH EVERY MORNING AND 1 TABLET  EVERY EVENING. 270 tablet 0  ? nitroGLYCERIN (NITROSTAT) 0.4 MG SL tablet Place 1 tablet (0.4 mg total) under the tongue every 5 (five) minutes as needed for chest pain. 50 tablet 3  ? omega-3 acid ethyl esters (LOVAZA) 1 g capsule Take 2 capsules (2 g total) by mouth 2 (two) times daily. 360 capsule 1  ? ?No current facility-administered medications on file prior to visit.  ? ? ?Allergies  ?Allergen Reactions  ? Ciprofloxacin   ?  Pruritis and related conditions, NOS  ? Macrobid [Nitrofurantoin]   ?  Abdominal pain  ? Sulfa Antibiotics Other (See Comments)  ?  Swelling years ago ?  ? ? ?Objective: ?Physical Exam ? ?General: Well developed, nourished, no acute distress, awake, alert and oriented x 3 ? ?Vascular: Dorsalis pedis artery 1/4 bilateral, Posterior tibial artery 0/4 bilateral due to trace edema at ankles, skin temperature warm to warm proximal to distal bilateral lower extremities, mild varicosities, pedal hair present bilateral. ? ?Neurological: Gross sensation present via light touch bilateral.  ? ?Dermatological: Skin is warm, dry, and supple bilateral, Nails 1-10 are tender, long, thick, and discolored with mild subungal debris, no webspace macerations present bilateral, no open lesions present bilateral, no callus/corns/hyperkeratotic tissue present bilateral. No signs of infection bilateral. ? ?Musculoskeletal: Asymptomatic mild hammertoe boney deformities noted bilateral. Muscular strength within normal limits without painon range of motion. No pain with calf compression bilateral. ? ?Assessment and Plan:  ?  Problem List Items Addressed This Visit   ?None ?Visit Diagnoses   ? ? Pain due to onychomycosis of toenails of both feet    -  Primary  ? PVD (peripheral vascular disease) ([place])      ? Relevant Medications  ? amLODipine (NORVASC) 5 MG tablet  ? hydrochlorothiazide (MICROZIDE) 12.5 MG capsule  ? ?  ? ? ?-Examined patient.  ?-Re-Discussed treatment options for painful mycotic nails. ?-Mechanically  debrided and reduced all painful mycotic nails with sterile nail nipper and dremel nail file without incident. ?-Patient to return in 3 months for follow up evaluation or sooner if symptoms worsen. ? ?Landis Martins, DPM ? ?

## 2021-06-23 DIAGNOSIS — I10 Essential (primary) hypertension: Secondary | ICD-10-CM | POA: Diagnosis not present

## 2021-07-14 DIAGNOSIS — I10 Essential (primary) hypertension: Secondary | ICD-10-CM | POA: Diagnosis not present

## 2021-09-13 ENCOUNTER — Ambulatory Visit: Payer: PPO | Admitting: Podiatry

## 2021-09-16 ENCOUNTER — Ambulatory Visit: Payer: PPO | Admitting: Podiatry

## 2021-09-16 ENCOUNTER — Encounter: Payer: Self-pay | Admitting: Podiatry

## 2021-09-16 ENCOUNTER — Other Ambulatory Visit: Payer: Self-pay | Admitting: *Deleted

## 2021-09-16 DIAGNOSIS — B351 Tinea unguium: Secondary | ICD-10-CM | POA: Diagnosis not present

## 2021-09-16 DIAGNOSIS — M79675 Pain in left toe(s): Secondary | ICD-10-CM

## 2021-09-16 DIAGNOSIS — I739 Peripheral vascular disease, unspecified: Secondary | ICD-10-CM | POA: Diagnosis not present

## 2021-09-16 DIAGNOSIS — M79674 Pain in right toe(s): Secondary | ICD-10-CM | POA: Diagnosis not present

## 2021-09-26 NOTE — Progress Notes (Signed)
  Subjective:  Patient ID: Stacy Adams, female    DOB: 05-May-1940,  MRN: [id number]  Stacy Adams presents to clinic today for at risk foot care. Patient has h/o PAD and painful elongated mycotic toenails 1-5 bilaterally which are tender when wearing enclosed shoe gear. Pain is relieved with periodic professional debridement.  Patient is accompanied by her husband on today's visit.  New problem(s): None.   PCP is Ardeen Garland, PA-C , and last visit was July 14, 2021.  Allergies  Allergen Reactions   Ciprofloxacin     Pruritis and related conditions, NOS   Macrobid [Nitrofurantoin]     Abdominal pain   Sulfa Antibiotics Other (See Comments)    Swelling years ago     Review of Systems: Negative except as noted in the HPI.  Objective: No changes noted in today's physical examination. Constitutional Pt is a pleasant 81 y.o. Caucasian female in NAD.Marland Kitchen AAO x 3.  Vascular Capillary fill time to digits <3 seconds b/l lower extremities. Faintly palpable DP pulse(s) b/l lower extremities. Nonpalpable PT pulse(s) b/l lower extremities. Pedal hair sparse. Lower extremity skin temperature gradient within normal limits. Varicosities present b/l. No cyanosis or clubbing noted.  Neurologic Normal speech. Oriented to person, place, and time. Protective sensation intact 5/5 intact bilaterally with 10g monofilament b/l.  Dermatologic Pedal skin with normal turgor, texture and tone bilaterally. No open wounds bilaterally. No interdigital macerations bilaterally. Toenails 1-5 b/l elongated, discolored, dystrophic, thickened, crumbly with subungual debris and tenderness to dorsal palpation.  Orthopedic: Normal muscle strength 5/5 to all lower extremity muscle groups bilaterally. No pain crepitus or joint limitation noted with ROM b/l. Hammertoes noted to the b/l lower extremities.   Radiographs: None Assessment/Plan: 1. Pain due to onychomycosis of toenails of both feet   2. PVD  (peripheral vascular disease) ([place])     -Patient was evaluated and treated. All patient's and/or POA's questions/concerns answered on today's visit. -Patient to continue soft, supportive shoe gear daily. -Mycotic toenails 1-5 bilaterally were debrided in length and girth with sterile nail nippers and dremel without incident. -Patient/POA to call should there be question/concern in the interim.   Return in about 3 months (around 12/17/2021).  Marzetta Board, DPM

## 2021-10-13 DIAGNOSIS — I10 Essential (primary) hypertension: Secondary | ICD-10-CM | POA: Diagnosis not present

## 2021-10-27 DIAGNOSIS — D2239 Melanocytic nevi of other parts of face: Secondary | ICD-10-CM | POA: Diagnosis not present

## 2021-10-27 DIAGNOSIS — L821 Other seborrheic keratosis: Secondary | ICD-10-CM | POA: Diagnosis not present

## 2021-10-28 DIAGNOSIS — R944 Abnormal results of kidney function studies: Secondary | ICD-10-CM | POA: Diagnosis not present

## 2021-12-16 ENCOUNTER — Ambulatory Visit: Payer: PPO | Admitting: Podiatry

## 2021-12-16 ENCOUNTER — Encounter: Payer: Self-pay | Admitting: Podiatry

## 2021-12-16 DIAGNOSIS — M79674 Pain in right toe(s): Secondary | ICD-10-CM | POA: Diagnosis not present

## 2021-12-16 DIAGNOSIS — I739 Peripheral vascular disease, unspecified: Secondary | ICD-10-CM

## 2021-12-16 DIAGNOSIS — B351 Tinea unguium: Secondary | ICD-10-CM | POA: Diagnosis not present

## 2021-12-16 DIAGNOSIS — M79675 Pain in left toe(s): Secondary | ICD-10-CM | POA: Diagnosis not present

## 2021-12-23 NOTE — Progress Notes (Signed)
  Subjective:  Patient ID: Stacy Adams, female    DOB: [birth year],  MRN: [id number]  Stacy Adams presents to clinic today for at risk foot care. Patient has h/o PAD and painful thick toenails that are difficult to trim. Pain interferes with ambulation. Aggravating factors include wearing enclosed shoe gear. Pain is relieved with periodic professional debridement.  New problem(s): None.   PCP is Stacy Garland, PA-C , and last visit was  October 28, 2021.  Allergies  Allergen Reactions   Ciprofloxacin     Pruritis and related conditions, NOS   Macrobid [Nitrofurantoin]     Abdominal pain   Sulfa Antibiotics Other (See Comments)    Swelling years ago     Review of Systems: Negative except as noted in the HPI.  Objective: No changes noted in today's physical examination. Stacy Adams is a pleasant 81 y.o. female in NAD. AAO x 3.  Vascular Examination: CFT <3 seconds b/l. DP pulses faintly palpable b/l. PT pulses diminished b/l. Digital hair sparse b/l. Skin temperature gradient warm to warm b/l. No pain with calf compression. No ischemia or gangrene. No cyanosis or clubbing noted b/l. Varicosities present b/l.   Neurological Examination: Sensation grossly intact b/l with 10 gram monofilament. Vibratory sensation intact b/l.   Dermatological Examination: Pedal skin warm and supple b/l. Toenails 1-5 b/l thick, discolored, elongated with subungual debris and pain on dorsal palpation.  No hyperkeratotic nor porokeratotic lesions present on today's visit.  Musculoskeletal Examination: Muscle strength 5/5 to b/l LE. Hammertoe deformity noted 2-5 b/l.  Radiographs: None  Assessment/Plan: 1. Pain due to onychomycosis of toenails of both feet   2. PVD (peripheral vascular disease) ([place])   -Examined patient. -Patient to continue soft, supportive shoe gear daily. -Toenails 1-5 b/l were debrided in length and girth with sterile nail nippers and dremel without  iatrogenic bleeding.  -Patient/POA to call should there be question/concern in the interim.   Return in about 3 months (around 03/17/2022).  Stacy Adams, DPM

## 2022-01-17 DIAGNOSIS — E876 Hypokalemia: Secondary | ICD-10-CM | POA: Diagnosis not present

## 2022-01-17 DIAGNOSIS — Z951 Presence of aortocoronary bypass graft: Secondary | ICD-10-CM | POA: Diagnosis not present

## 2022-01-17 DIAGNOSIS — Z79899 Other long term (current) drug therapy: Secondary | ICD-10-CM | POA: Diagnosis not present

## 2022-01-17 DIAGNOSIS — I2510 Atherosclerotic heart disease of native coronary artery without angina pectoris: Secondary | ICD-10-CM | POA: Diagnosis not present

## 2022-01-17 DIAGNOSIS — Z780 Asymptomatic menopausal state: Secondary | ICD-10-CM | POA: Diagnosis not present

## 2022-01-17 DIAGNOSIS — Z0000 Encounter for general adult medical examination without abnormal findings: Secondary | ICD-10-CM | POA: Diagnosis not present

## 2022-01-17 DIAGNOSIS — I10 Essential (primary) hypertension: Secondary | ICD-10-CM | POA: Diagnosis not present

## 2022-01-17 DIAGNOSIS — Z7902 Long term (current) use of antithrombotics/antiplatelets: Secondary | ICD-10-CM | POA: Diagnosis not present

## 2022-01-17 DIAGNOSIS — R519 Headache, unspecified: Secondary | ICD-10-CM | POA: Diagnosis not present

## 2022-01-17 DIAGNOSIS — E785 Hyperlipidemia, unspecified: Secondary | ICD-10-CM | POA: Diagnosis not present

## 2022-01-17 DIAGNOSIS — Z8673 Personal history of transient ischemic attack (TIA), and cerebral infarction without residual deficits: Secondary | ICD-10-CM | POA: Diagnosis not present

## 2022-01-17 DIAGNOSIS — Z13220 Encounter for screening for lipoid disorders: Secondary | ICD-10-CM | POA: Diagnosis not present

## 2022-01-17 DIAGNOSIS — I252 Old myocardial infarction: Secondary | ICD-10-CM | POA: Diagnosis not present

## 2022-01-17 DIAGNOSIS — I480 Paroxysmal atrial fibrillation: Secondary | ICD-10-CM | POA: Diagnosis not present

## 2022-01-17 DIAGNOSIS — E559 Vitamin D deficiency, unspecified: Secondary | ICD-10-CM | POA: Diagnosis not present

## 2022-01-17 DIAGNOSIS — Z1329 Encounter for screening for other suspected endocrine disorder: Secondary | ICD-10-CM | POA: Diagnosis not present

## 2022-03-31 ENCOUNTER — Encounter: Payer: Self-pay | Admitting: Podiatry

## 2022-03-31 ENCOUNTER — Ambulatory Visit: Payer: PPO | Admitting: Podiatry

## 2022-03-31 VITALS — BP 164/82

## 2022-03-31 DIAGNOSIS — M79675 Pain in left toe(s): Secondary | ICD-10-CM

## 2022-03-31 DIAGNOSIS — I739 Peripheral vascular disease, unspecified: Secondary | ICD-10-CM | POA: Diagnosis not present

## 2022-03-31 DIAGNOSIS — M79674 Pain in right toe(s): Secondary | ICD-10-CM | POA: Diagnosis not present

## 2022-03-31 DIAGNOSIS — B351 Tinea unguium: Secondary | ICD-10-CM

## 2022-03-31 NOTE — Progress Notes (Signed)
  Subjective:  Patient ID: Stacy Adams, female    DOB: 02-19-1941,  MRN: [id number]  Stacy Adams presents to clinic today for at risk foot care. Patient has h/o PAD and painful elongated mycotic toenails 1-5 bilaterally which are tender when wearing enclosed shoe gear. Pain is relieved with periodic professional debridement.  Chief Complaint  Patient presents with   Nail Problem    Nail trim Not Diabetic PCP - Gaynelle Arabian, last OV 01/2022   New problem(s): None.   PCP is Ardeen Garland, PA-C.  Allergies  Allergen Reactions   Ciprofloxacin     Pruritis and related conditions, NOS   Macrobid [Nitrofurantoin]     Abdominal pain   Sulfa Antibiotics Other (See Comments)    Swelling years ago     Review of Systems: Negative except as noted in the HPI.  Objective: No changes noted in today's physical examination. Vitals:   03/31/22 1620  BP: (!) 164/82   Stacy Adams is a pleasant 82 y.o. female WD, WN in NAD. AAO x 3.  Vascular Examination: CFT <3 seconds b/l. DP pulses faintly palpable b/l. PT pulses diminished b/l. Digital hair sparse b/l. Skin temperature gradient warm to warm b/l. No pain with calf compression. No ischemia or gangrene. No cyanosis or clubbing noted b/l. Varicosities present b/l.   Neurological Examination: Sensation grossly intact b/l with 10 gram monofilament. Vibratory sensation intact b/l.   Dermatological Examination: Pedal skin warm and supple b/l. Toenails 1-5 b/l thick, discolored, elongated with subungual debris and pain on dorsal palpation.  No hyperkeratotic nor porokeratotic lesions present on today's visit.  Musculoskeletal Examination: Muscle strength 5/5 to b/l LE. Hammertoe deformity noted 2-5 b/l.  Radiographs: None  Assessment/Plan: 1. Pain due to onychomycosis of toenails of both feet   2. PVD (peripheral vascular disease) ([place])     No orders of the defined types were placed in this encounter.    -Patient was evaluated and treated. All patient's and/or POA's questions/concerns answered on today's visit. -No new findings. No new orders. -Continue supportive shoe gear daily. -Mycotic toenails 1-5 bilaterally were debrided in length and girth with sterile nail nippers and dremel without incident. -Patient/POA to call should there be question/concern in the interim.   Return in about 3 months (around 06/30/2022).  Stacy Adams, DPM

## 2022-07-07 ENCOUNTER — Ambulatory Visit: Payer: PPO | Admitting: Podiatry

## 2022-07-07 VITALS — BP 191/91 | HR 60

## 2022-07-07 DIAGNOSIS — I739 Peripheral vascular disease, unspecified: Secondary | ICD-10-CM

## 2022-07-07 DIAGNOSIS — B351 Tinea unguium: Secondary | ICD-10-CM

## 2022-07-07 DIAGNOSIS — M79675 Pain in left toe(s): Secondary | ICD-10-CM | POA: Diagnosis not present

## 2022-07-07 DIAGNOSIS — M79674 Pain in right toe(s): Secondary | ICD-10-CM | POA: Diagnosis not present

## 2022-07-07 NOTE — Progress Notes (Signed)
  Subjective:  Patient ID: Stacy Adams, female    DOB: October 22, 1940,  MRN: [id number]  Stacy Adams presents to clinic today for   Chief Complaint  Patient presents with   NAIL CARE    RFC NO OTHER COMPLAINTS   New problem(s): None.   PCP is Nino Glow, PA-C.  Allergies  Allergen Reactions   Ciprofloxacin     Pruritis and related conditions, NOS   Macrobid [Nitrofurantoin]     Abdominal pain   Sulfa Antibiotics Other (See Comments)    Swelling years ago     Review of Systems: Negative except as noted in the HPI.  Objective: No changes noted in today's physical examination. Vitals:   07/07/22 1457  BP: (!) 191/91  Pulse: 60   Stacy Adams is a pleasant 82 y.o. female WD, WN in NAD. AAO x 3.  Vascular Examination: CFT <3 seconds b/l. DP pulses faintly palpable b/l. PT pulses diminished b/l. Digital hair sparse b/l. Skin temperature gradient warm to warm b/l. No pain with calf compression. No ischemia or gangrene. No cyanosis or clubbing noted b/l. Varicosities present b/l.   Neurological Examination: Sensation grossly intact b/l with 10 gram monofilament. Vibratory sensation intact b/l.   Dermatological Examination: Pedal skin warm and supple b/l. Toenails 1-5 b/l thick, discolored, elongated with subungual debris and pain on dorsal palpation.  No hyperkeratotic nor porokeratotic lesions present on today's visit.  Musculoskeletal Examination: Muscle strength 5/5 to b/l LE. Hammertoe deformity noted 2-5 b/l.  Radiographs: None  Assessment/Plan: 1. Pain due to onychomycosis of toenails of both feet   2. PVD (peripheral vascular disease)     -Consent given for treatment as described below: -Examined patient. -Patient to continue soft, supportive shoe gear daily. -Toenails 1-5 b/l were debrided in length and girth with sterile nail nippers and dremel without iatrogenic bleeding.  -Patient/POA to call should there be question/concern in the  interim.   Return in about 3 months (around 10/06/2022).  Freddie Breech, DPM

## 2022-07-10 ENCOUNTER — Encounter: Payer: Self-pay | Admitting: Podiatry

## 2022-10-12 ENCOUNTER — Ambulatory Visit: Payer: PPO | Admitting: Podiatry

## 2022-10-12 DIAGNOSIS — M79674 Pain in right toe(s): Secondary | ICD-10-CM

## 2022-10-12 DIAGNOSIS — B351 Tinea unguium: Secondary | ICD-10-CM | POA: Diagnosis not present

## 2022-10-12 DIAGNOSIS — M79675 Pain in left toe(s): Secondary | ICD-10-CM | POA: Diagnosis not present

## 2022-10-12 NOTE — Progress Notes (Signed)
    Subjective:  Patient ID: Stacy Adams, female    DOB: May 25, 1940,  MRN: [id number]   Stacy Adams presents to clinic today for:  Chief Complaint  Patient presents with   Nail Problem    [facility]  . Patient notes nails are thick, discolored, elongated and painful in shoegear when trying to ambulate.    PCP is Stacy Glow, PA-C.  Allergies  Allergen Reactions   Ciprofloxacin     Pruritis and related conditions, NOS   Macrobid [Nitrofurantoin]     Abdominal pain   Sulfa Antibiotics Other (See Comments)    Swelling years ago     Review of Systems: Negative except as noted in the HPI.  Objective:  There were no vitals filed for this visit.  Stacy Adams is a pleasant 82 y.o. female in NAD. AAO x 3.  Vascular Examination: Capillary refill time is 3-5 seconds to toes bilateral. Palpable pedal pulses b/l LE. Digital hair present b/l. No pedal edema b/l. Skin temperature gradient WNL b/l. No varicosities b/l. No cyanosis or clubbing noted b/l.   Dermatological Examination: Pedal skin with normal turgor, texture and tone b/l. No open wounds. No interdigital macerations b/l. Toenails x10 are 3mm thick, discolored, dystrophic with subungual debris. There is pain with compression of the nail plates.  They are elongated x10  Assessment/Plan: 1. Pain due to onychomycosis of toenails of both feet     The mycotic toenails were sharply debrided x10 with sterile nail nippers and a power debriding burr to decrease bulk/thickness and length.    Return in about 3 months (around 01/12/2023) for [facility].   Clerance Lav, DPM, FACFAS [facility]     [address] [place], [place] [address]                Office [phone]  Fax [phone]

## 2023-01-18 ENCOUNTER — Encounter: Payer: Self-pay | Admitting: Podiatry

## 2023-01-18 ENCOUNTER — Ambulatory Visit: Payer: PPO | Admitting: Podiatry

## 2023-01-18 DIAGNOSIS — M79674 Pain in right toe(s): Secondary | ICD-10-CM

## 2023-01-18 DIAGNOSIS — B351 Tinea unguium: Secondary | ICD-10-CM | POA: Diagnosis not present

## 2023-01-18 DIAGNOSIS — M79675 Pain in left toe(s): Secondary | ICD-10-CM | POA: Diagnosis not present

## 2023-01-18 NOTE — Progress Notes (Signed)
Subjective:  Patient ID: Stacy Adams, female    DOB: January 04, 1941,  MRN: [id number]  Stacy Adams presents to clinic today for:  Chief Complaint  Patient presents with   RFC    Nail trim   Patient notes nails are thick, discolored, elongated and painful in shoegear when trying to ambulate.    PCP is Nino Glow, PA-C.  Past Medical History:  Diagnosis Date   Anxiety    Aphasia following cerebral infarction    Blindness left eye category 4, normal vision right eye    CAD (coronary artery disease), native coronary artery 03/25/2011   80-90% LAD after D1 followed by 60-70% consecutive lesions at T2. Circumflex: 95% ostial, RCA 95-99% --> referred for CABG   CAD, multiple vessel 08/24/2007   CHF (congestive heart failure) (HCC)    GERD (gastroesophageal reflux disease)    Hyperlipidemia 12/21/2006   HYPERTENSION 12/21/2006   Hypokalemia    NSTEMI (non-ST elevated myocardial infarction) Urgent cath 03/25/2011   Multivessel CAD   Osteopenia 12/21/2006   PAF (paroxysmal atrial fibrillation) (HCC) January 2013   Postop A. fib, no recurrence   S/P CABG x 4 03/26/2011   LIMA-LAD, SVG-Diag, SVG-Cx-OM, SVG-RCA; Intra-Op TEE: EF 60-65%.   Stroke [facility])    October 2020   TIA (transient ischemic attack)     Past Surgical History:  Procedure Laterality Date   ABDOMINAL HYSTERECTOMY     CARDIAC CATHETERIZATION  03/25/2011   EF 60 is 55%.80-90% LAD after D1 followed by 60-70% consecutive lesions at D2. Circumflex: 95% ostial, RCA 95-99% --> referred for CABG   CORONARY ARTERY BYPASS GRAFT  03/26/2011   Procedure: CORONARY ARTERY BYPASS GRAFTING (CABG);  Surgeon: Delight Ovens, MD;  Location: [facility] OR;  Service: Open Heart Surgery;  Laterality: N/A;   LEFT HEART CATHETERIZATION WITH CORONARY ANGIOGRAM N/A 03/25/2011   Procedure: LEFT HEART CATHETERIZATION WITH CORONARY ANGIOGRAM;  Surgeon: Marykay Lex, MD;  Location: [facility] CATH LAB;  Service: Cardiovascular;   Laterality: N/A;    Allergies  Allergen Reactions   Ciprofloxacin     Pruritis and related conditions, NOS   Macrobid [Nitrofurantoin]     Abdominal pain   Sulfa Antibiotics Other (See Comments)    Swelling years ago     Review of Systems: Negative except as noted in the HPI.  Objective: Stacy Adams is a pleasant 82 y.o. female in NAD. AAO x 3.  Vascular Examination: Capillary refill time is 3-5 seconds to toes bilateral. Palpable pedal pulses b/l LE. Digital hair sparse b/l.  Skin temperature gradient WNL b/l. No varicosities b/l. No cyanosis noted b/l.   Dermatological Examination: Pedal skin with normal turgor, texture and tone b/l. No open wounds. No interdigital macerations b/l. Toenails x10 are 3mm thick, discolored, dystrophic with subungual debris. There is pain with compression of the nail plates.  They are elongated x10  Assessment/Plan: 1. Pain due to onychomycosis of toenails of both feet    The mycotic toenails were sharply debrided x10 with sterile nail nippers and a power debriding burr to decrease bulk/thickness and length.    Return in about 3 months (around 04/20/2023) for [facility].   Clerance Lav, DPM, FACFAS [facility]     [address]  [place], [place] [address]                Office [phone]  Fax [phone]

## 2023-04-20 ENCOUNTER — Ambulatory Visit (INDEPENDENT_AMBULATORY_CARE_PROVIDER_SITE_OTHER): Payer: PPO | Admitting: Podiatry

## 2023-04-20 DIAGNOSIS — I739 Peripheral vascular disease, unspecified: Secondary | ICD-10-CM

## 2023-04-20 DIAGNOSIS — M79675 Pain in left toe(s): Secondary | ICD-10-CM

## 2023-04-20 DIAGNOSIS — M79674 Pain in right toe(s): Secondary | ICD-10-CM

## 2023-04-20 DIAGNOSIS — B351 Tinea unguium: Secondary | ICD-10-CM

## 2023-04-20 NOTE — Progress Notes (Signed)
       Subjective:  Patient ID: Stacy Adams, female    DOB: 03-20-1941,  MRN: [id number]  Stacy Adams presents to clinic today for:  Chief Complaint  Patient presents with   RFC    RFC no callous. Takes Plavix   Patient notes nails are thick and elongated, causing pain in shoe gear when ambulating.  Patient has been recovering from a stroke.    PCP is Nino Glow, PA-C.  Last seen 02/16/23.  Past Medical History:  Diagnosis Date   Anxiety    Aphasia following cerebral infarction    Blindness left eye category 4, normal vision right eye    CAD (coronary artery disease), native coronary artery 03/25/2011   80-90% LAD after D1 followed by 60-70% consecutive lesions at T2. Circumflex: 95% ostial, RCA 95-99% --> referred for CABG   CAD, multiple vessel 08/24/2007   CHF (congestive heart failure) (HCC)    GERD (gastroesophageal reflux disease)    Hyperlipidemia 12/21/2006   HYPERTENSION 12/21/2006   Hypokalemia    NSTEMI (non-ST elevated myocardial infarction) Urgent cath 03/25/2011   Multivessel CAD   Osteopenia 12/21/2006   PAF (paroxysmal atrial fibrillation) (HCC) January 2013   Postop A. fib, no recurrence   S/P CABG x 4 03/26/2011   LIMA-LAD, SVG-Diag, SVG-Cx-OM, SVG-RCA; Intra-Op TEE: EF 60-65%.   Stroke [facility])    October 2020   TIA (transient ischemic attack)     Allergies  Allergen Reactions   Ciprofloxacin     Pruritis and related conditions, NOS   Macrobid [Nitrofurantoin]     Abdominal pain   Sulfa Antibiotics Other (See Comments)    Swelling years ago    Objective:  [name] is a pleasant 83 y.o. female in NAD. AAO x 3.  Vascular Examination: Patient has palpable DP pulse, absent PT pulse bilateral.  Delayed capillary refill bilateral toes.  Sparse digital hair bilateral.  Proximal to distal cooling WNL bilateral.    Dermatological Examination: Interspaces are clear with no open lesions noted bilateral.  Skin is shiny and  atrophic bilateral.  Nails are 3-74mm thick, with yellowish/brown discoloration, subungual debris and distal onycholysis x10.  There is pain with compression of nails x10.    Patient qualifies for at-risk foot care because of PVD .  Assessment/Plan: 1. Pain due to onychomycosis of toenails of both feet   2. PVD (peripheral vascular disease) (HCC)    Mycotic nails x10 were sharply debrided with sterile nail nippers and power debriding burr to decrease bulk and length.  Return in about 3 months (around 07/19/2023) for [facility].   Clerance Lav, DPM, FACFAS [facility]     [address] [place], [place] [address]                Office [phone]  Fax [phone]

## 2023-07-19 ENCOUNTER — Ambulatory Visit: Payer: PPO | Admitting: Podiatry

## 2023-07-19 DIAGNOSIS — M79674 Pain in right toe(s): Secondary | ICD-10-CM | POA: Diagnosis not present

## 2023-07-19 DIAGNOSIS — M79675 Pain in left toe(s): Secondary | ICD-10-CM

## 2023-07-19 DIAGNOSIS — B351 Tinea unguium: Secondary | ICD-10-CM | POA: Diagnosis not present

## 2023-07-19 NOTE — Progress Notes (Signed)
       Subjective:  Patient ID: Stacy Adams, female    DOB: 1940/11/24,  MRN: [id number]  Stacy Adams presents to clinic today for:  Chief Complaint  Patient presents with   [facility]    RFC with out callous, nails only.  She is not diabetic and takes Plavix   Patient notes nails are thick and elongated, causing pain in shoe gear when ambulating.    PCP is Margorie Shelter, PA-C.  Last seen 07/03/23  Past Medical History:  Diagnosis Date   Anxiety    Aphasia following cerebral infarction    Blindness left eye category 4, normal vision right eye    CAD (coronary artery disease), native coronary artery 03/25/2011   80-90% LAD after D1 followed by 60-70% consecutive lesions at T2. Circumflex: 95% ostial, RCA 95-99% --> referred for CABG   CAD, multiple vessel 08/24/2007   CHF (congestive heart failure) (HCC)    GERD (gastroesophageal reflux disease)    Hyperlipidemia 12/21/2006   HYPERTENSION 12/21/2006   Hypokalemia    NSTEMI (non-ST elevated myocardial infarction) Urgent cath 03/25/2011   Multivessel CAD   Osteopenia 12/21/2006   PAF (paroxysmal atrial fibrillation) (HCC) January 2013   Postop A. fib, no recurrence   S/P CABG x 4 03/26/2011   LIMA-LAD, SVG-Diag, SVG-Cx-OM, SVG-RCA; Intra-Op TEE: EF 60-65%.   Stroke [facility])    October 2020   TIA (transient ischemic attack)     Allergies  Allergen Reactions   Ciprofloxacin     Pruritis and related conditions, NOS   Macrobid [Nitrofurantoin]     Abdominal pain   Sulfa Antibiotics Other (See Comments)    Swelling years ago    Objective:  Ravan ARLISS KOEP is a pleasant 83 y.o. female in NAD. AAO x 3.  Vascular Examination: Patient has palpable DP pulse, absent PT pulse bilateral.  Delayed capillary refill bilateral toes.  Sparse digital hair bilateral.  Proximal to distal cooling WNL bilateral.    Dermatological Examination: Interspaces are clear with no open lesions noted bilateral.  Skin is shiny and  atrophic bilateral.  Nails are 3-80mm thick, with yellowish/brown discoloration, subungual debris and distal onycholysis x10.  There is pain with compression of nails x10.    Patient qualifies for at-risk foot care because of PVD .  Assessment/Plan: 1. Pain due to onychomycosis of toenails of both feet    Mycotic nails x10 were sharply debrided with sterile nail nippers and power debriding burr to decrease bulk and length.  Return in about 3 months (around 10/18/2023) for [facility].   Joe Murders, DPM, FACFAS [facility]     [address] [place], [place] [address]                Office [phone]  Fax [phone]

## 2023-10-25 ENCOUNTER — Ambulatory Visit: Admitting: Podiatry

## 2024-05-20 ENCOUNTER — Encounter: Payer: Self-pay | Admitting: Obstetrics and Gynecology
